# Patient Record
Sex: Female | Born: 1965
Health system: Southern US, Community
[De-identification: ages and names within clinical notes are randomized; demographics above are authoritative.]

## PROBLEM LIST (undated history)

## (undated) DIAGNOSIS — K219 Gastro-esophageal reflux disease without esophagitis: Secondary | ICD-10-CM

## (undated) DIAGNOSIS — T8859XA Other complications of anesthesia, initial encounter: Secondary | ICD-10-CM

## (undated) DIAGNOSIS — T4145XA Adverse effect of unspecified anesthetic, initial encounter: Secondary | ICD-10-CM

## (undated) DIAGNOSIS — F329 Major depressive disorder, single episode, unspecified: Secondary | ICD-10-CM

## (undated) DIAGNOSIS — M2550 Pain in unspecified joint: Secondary | ICD-10-CM

## (undated) DIAGNOSIS — M1990 Unspecified osteoarthritis, unspecified site: Secondary | ICD-10-CM

## (undated) DIAGNOSIS — R112 Nausea with vomiting, unspecified: Secondary | ICD-10-CM

## (undated) DIAGNOSIS — Z9889 Other specified postprocedural states: Secondary | ICD-10-CM

## (undated) DIAGNOSIS — F32A Depression, unspecified: Secondary | ICD-10-CM

## (undated) HISTORY — PX: NASAL SINUS SURGERY: SHX719

## (undated) HISTORY — PX: ABDOMINAL HYSTERECTOMY: SHX81

---

## 2006-11-19 ENCOUNTER — Encounter: Admission: RE | Admit: 2006-11-19 | Discharge: 2007-02-04 | Payer: Self-pay | Admitting: Unknown Physician Specialty

## 2007-01-20 ENCOUNTER — Ambulatory Visit (HOSPITAL_COMMUNITY): Admission: RE | Admit: 2007-01-20 | Discharge: 2007-01-20 | Payer: Self-pay | Admitting: Unknown Physician Specialty

## 2007-05-12 ENCOUNTER — Emergency Department (HOSPITAL_COMMUNITY): Admission: EM | Admit: 2007-05-12 | Discharge: 2007-05-12 | Payer: Self-pay | Admitting: Family Medicine

## 2008-01-25 ENCOUNTER — Ambulatory Visit (HOSPITAL_COMMUNITY): Admission: RE | Admit: 2008-01-25 | Discharge: 2008-01-25 | Payer: Self-pay | Admitting: Obstetrics and Gynecology

## 2008-01-26 ENCOUNTER — Ambulatory Visit (HOSPITAL_COMMUNITY): Admission: RE | Admit: 2008-01-26 | Discharge: 2008-01-26 | Payer: Self-pay | Admitting: Obstetrics and Gynecology

## 2008-01-28 ENCOUNTER — Emergency Department (HOSPITAL_COMMUNITY): Admission: EM | Admit: 2008-01-28 | Discharge: 2008-01-28 | Payer: Self-pay | Admitting: Emergency Medicine

## 2008-07-20 ENCOUNTER — Encounter (INDEPENDENT_AMBULATORY_CARE_PROVIDER_SITE_OTHER): Payer: Self-pay | Admitting: Otolaryngology

## 2008-07-20 ENCOUNTER — Ambulatory Visit (HOSPITAL_COMMUNITY): Admission: RE | Admit: 2008-07-20 | Discharge: 2008-07-20 | Payer: Self-pay | Admitting: Otolaryngology

## 2009-04-14 ENCOUNTER — Ambulatory Visit (HOSPITAL_COMMUNITY): Admission: RE | Admit: 2009-04-14 | Discharge: 2009-04-14 | Payer: Self-pay | Admitting: Family Medicine

## 2010-02-25 HISTORY — PX: CHOLECYSTECTOMY: SHX55

## 2010-04-05 ENCOUNTER — Other Ambulatory Visit (HOSPITAL_COMMUNITY): Payer: Self-pay | Admitting: Family Medicine

## 2010-04-05 DIAGNOSIS — Z1231 Encounter for screening mammogram for malignant neoplasm of breast: Secondary | ICD-10-CM

## 2010-04-16 ENCOUNTER — Ambulatory Visit (HOSPITAL_COMMUNITY): Payer: 59

## 2010-04-18 ENCOUNTER — Other Ambulatory Visit: Payer: Self-pay | Admitting: Surgery

## 2010-04-18 ENCOUNTER — Emergency Department (INDEPENDENT_AMBULATORY_CARE_PROVIDER_SITE_OTHER): Payer: 59

## 2010-04-18 ENCOUNTER — Emergency Department (HOSPITAL_BASED_OUTPATIENT_CLINIC_OR_DEPARTMENT_OTHER)
Admission: EM | Admit: 2010-04-18 | Discharge: 2010-04-18 | Disposition: A | Discharge status: Other Acute Inpatient Hospital | Payer: 59 | Source: Home / Self Care | Attending: Emergency Medicine | Admitting: Emergency Medicine

## 2010-04-18 ENCOUNTER — Observation Stay (HOSPITAL_COMMUNITY)
Admission: EM | Admit: 2010-04-18 | Discharge: 2010-04-19 | Disposition: A | Discharge status: Home / Self Care | Payer: 59 | Attending: Surgery | Admitting: Surgery

## 2010-04-18 DIAGNOSIS — R110 Nausea: Secondary | ICD-10-CM | POA: Insufficient documentation

## 2010-04-18 DIAGNOSIS — R109 Unspecified abdominal pain: Secondary | ICD-10-CM | POA: Insufficient documentation

## 2010-04-18 DIAGNOSIS — R10811 Right upper quadrant abdominal tenderness: Secondary | ICD-10-CM | POA: Insufficient documentation

## 2010-04-18 DIAGNOSIS — K819 Cholecystitis, unspecified: Secondary | ICD-10-CM | POA: Insufficient documentation

## 2010-04-18 DIAGNOSIS — K8010 Calculus of gallbladder with chronic cholecystitis without obstruction: Principal | ICD-10-CM | POA: Insufficient documentation

## 2010-04-18 DIAGNOSIS — K8020 Calculus of gallbladder without cholecystitis without obstruction: Secondary | ICD-10-CM

## 2010-04-18 DIAGNOSIS — M549 Dorsalgia, unspecified: Secondary | ICD-10-CM | POA: Insufficient documentation

## 2010-04-18 LAB — COMPREHENSIVE METABOLIC PANEL
ALT: 20 U/L (ref 0–35)
Alkaline Phosphatase: 56 U/L (ref 39–117)
Calcium: 9.4 mg/dL (ref 8.4–10.5)
Creatinine, Ser: 0.8 mg/dL (ref 0.4–1.2)
GFR calc Af Amer: >60 mL/min (ref >60)
Potassium: 4.6 mEq/L (ref 3.5–5.1)
Total Bilirubin: 0.7 mg/dL (ref 0.3–1.2)

## 2010-04-18 LAB — CBC
MCHC: 33.6 g/dL (ref 30.0–36.0)
MCV: 90.1 fL (ref 78.0–100.0)
Platelets: 248 10*3/uL (ref 150–400)
RDW: 12.3 % (ref 11.5–15.5)

## 2010-04-18 LAB — DIFFERENTIAL
Basophils Absolute: 0 10*3/uL (ref 0.0–0.1)
Eosinophils Absolute: 0.1 10*3/uL (ref 0.0–0.7)
Lymphocytes Relative: 30 % (ref 12–46)
Lymphs Abs: 1.7 10*3/uL (ref 0.7–4.0)
Monocytes Absolute: 0.5 10*3/uL (ref 0.1–1.0)
Monocytes Relative: 9 % (ref 3–12)
Neutro Abs: 3.4 10*3/uL (ref 1.7–7.7)

## 2010-04-18 LAB — URINALYSIS, ROUTINE W REFLEX MICROSCOPIC
Hgb urine dipstick: NEGATIVE
Ketones, ur: 15 mg/dL — AB
Protein, ur: NEGATIVE mg/dL
Specific Gravity, Urine: 1.022 (ref 1.005–1.030)

## 2010-04-19 LAB — CBC
Hemoglobin: 11.2 g/dL — ABNORMAL LOW (ref 12.0–15.0)
MCHC: 32.7 g/dL (ref 30.0–36.0)
MCV: 92.4 fL (ref 78.0–100.0)
RBC: 3.7 MIL/uL — ABNORMAL LOW (ref 3.87–5.11)
RDW: 12.5 % (ref 11.5–15.5)
WBC: 6.7 10*3/uL (ref 4.0–10.5)

## 2010-04-19 LAB — COMPREHENSIVE METABOLIC PANEL
AST: 19 U/L (ref 0–37)
Albumin: 3 g/dL — ABNORMAL LOW (ref 3.5–5.2)
BUN: 4 mg/dL — ABNORMAL LOW (ref 6–23)
CO2: 27 mEq/L (ref 19–32)
Creatinine, Ser: 0.77 mg/dL (ref 0.4–1.2)
Glucose, Bld: 80 mg/dL (ref 70–99)
Sodium: 140 mEq/L (ref 135–145)
Total Protein: 5.4 g/dL — ABNORMAL LOW (ref 6.0–8.3)

## 2010-04-21 NOTE — H&P (Signed)
NAME:  Jody Blackwell, Jody Blackwell               ACCOUNT NO.:  [id number]  MEDICAL RECORD NO.:  [id number]           PATIENT TYPE:  E  LOCATION:  [facility]                         FACILITY:  [facility]  PHYSICIAN:  Abigail Miyamoto, M.D. DATE OF BIRTH:  [birth year]  DATE OF ADMISSION:  04/18/2010 DATE OF DISCHARGE:                             HISTORY & PHYSICAL   PRIMARY CARE PHYSICIAN:  Lupita Raider, MD.  REQUESTING PHYSICIAN:  Worthy Rancher, PA  CHIEF COMPLAINT:  Abdominal pain.  BRIEF HISTORY:  The patient is a 45 year old white female who noted some pain on Monday afternoon.  It was intermittent stomach, it was mild, nothing really made it better or worse.  It might have been a little bit worse after eating.  She has a problem with her shoulder and slept poorly on Monday night, but attributed to her shoulder discomfort.  Her shoulder discomfort has been ongoing for 2 months and she has an appointment to see an orthopedic surgeon today.  Tuesday morning, she felt fine and ate manicotti for lunch and the pain returned.  It was a low-grade discomfort, which also improved.  At supper, she ate some chicken, broccoli and brown rice and had no discomfort until this morning.  When she got up, she had pain in the midabdomen, then it went to her ribs and her back.  It was in the right upper quadrant.  It was significantly worse than anything she has ever had.  She had some nausea.  She tried some Tums, which did not help.  She tried to go to work.  When pain became so severe, she had to pull over.  Her husband, picked her up and took her to ER facility at [facility].  The patient reports she has had pain in right upper quadrant before, but nothing like what she has had today.  She has had no further nausea or vomiting. Her pain is currently well-controlled.  Last pain medication was around 9:30 a.m.  It is currently 1:20 p.m.  PAST MEDICAL HISTORY: 1. Migraine.  She has one every 3-4 months and she  is on propranolol     for this. 2. She has shingles 4 years ago. 3. History of sinusitis.  She has endoscopic nasal surgery, April 02, 2008. 4. She has a remote history of colitis after her son was born.  She     has undergone colonoscopy, but has had no problems for many years.  SURGICAL HISTORY:  Endoscopic nasal surgery, April 02, 2008.  She has had two C-sections and hysterectomy.  She has a history of LASIK eyes surgery bilaterally last year, history of plantar fasciitis with orthotics in her shoes.  FAMILY HISTORY:  Mother is living at 59 with diabetes.  Father is 78 with diabetes, heart disease, and arthritis.  She has one brother with high blood pressure, who uses tobacco.  No sisters.  SOCIAL HISTORY:  She is a Human resources officer.  Drugs:  None.  Alcohol: Social.  Cigarettes:  She smoked about 40 years, none since age 79.  REVIEW OF SYSTEMS:  FEVER:  None.  SKIN:  No changes.  WEIGHT:  She is down 12 pounds in 5 weeks on Weight Watchers.  CV:  She has occasional dizziness, all sounds are orthostatic.  It usually occurs with her standing.  No history of syncope, presyncope, stroke, or seizure. PULMONARY:  No orthopnea.  No PND.  No dyspnea on exertion.  No coughing or wheezing.  No recent URI.  CARDIAC:  No history of chest pain or palpitations.  No prior cardiac workup.  GI is positive for GERD.  No nausea, vomiting, diarrhea, constipation, or blood.  GU:  No trouble voiding.  LOWER EXTREMITIES:  No edema.  No claudication.  She does note she has plantar fasciitis.  MUSCULOSKELETAL:  No arthritis. PSYCHIATRIC:  No problems noted.  CURRENT MEDICATIONS: 1. Propranolol SR 80 mg daily. 2. [name] 0.625 mg daily. 3. Multivitamin daily. 4. Fish oil one daily. 5. Calcium citrate and [name] one daily. 6. Tylenol p.r.n., she also takes [name] p.r.n. for her GERD.  ALLERGIES:  ASPIRIN causes facial swelling; IBUPROFEN causes facial swelling; REGLAN causes  Parkinson's like syndrome; SULFA causes hives and nausea, often it comes with GENERAL ANESTHESIA.  Her last meal was 7 p.m. yesterday, April 17, 2010.  PHYSICAL EXAM:  GENERAL:  This is a well-nourished, well-developed white female, in no acute distress. VITAL SIGNS:  Temperature is 98.3, blood pressure is 114/69, heart rate is 75, respiratory rate is 20, satting 100% on room air. HEENT:  Head:  Normocephalic.  Eyes: Pupils equal, react to light. Ears, nose, throat, and mouth within normal limits.  Normal mucosa. NECK:  Trachea is midline.  Thyroid is not palpated.  There is no palpable thyromegaly, bruits, or JVD. CARDIAC:  No murmurs or rubs. PULMONARY:  Normal respiratory effort.  Clear to auscultation. ABDOMEN:  Soft, positive bowel sounds.  No palpable hepatosplenomegaly. No masses, hernia, or abscesses.  She is slightly tender to palpation in the right upper quadrant. GU/RECTAL:  Deferred. LYMPHADENOPATHY:  None noted. MUSCULOSKELETAL: Within normal limits. SKIN:  No changes. NEUROLOGIC:  No focal deficits noted. PSYCHIATRIC:  Normal affect.  LABORATORY DATA:  White count is 5.8, hemoglobin is 13, hematocrit is 39, platelets 248,000.  UA is negative.  Sodium is 143, potassium is 4.6, chloride is 108, CO2 is 27, BUN is 14, creatinine is 0.8, glucose is 105, lipase 214, SGPT is 20, SGOT is 17, total bilirubin is 0.7. Abdominal ultrasound reveals gallbladder filled with gallstones, sludge gallbladder, wall is thickened, and does not say how thick.  Her bile ducts are patent, 3 mm.  The right lobe of the liver has an 8-mm hemangioma.  The pancreas is normal and she had pain over the gallbladder with compression on ultrasound.  IMPRESSION: 1. Symptomatic cholelithiasis with probable low-grade pancreatitis. 2. Remote history of colitis. 3. Migraines. 4. Shingles. 5. Sinus surgery.  PLAN:  We are going to hydrate the patient.  We will make her n.p.o.  We will gently plan  to take her gallbladder out either today or tomorrow depending on how the schedule goes for remainder of the day.  We are going to put her on some Cipro.  We will recheck her labs in the a.m. with further workup and evaluation as needed.     Eber Hong, P.A.   ______________________________ Abigail Miyamoto, M.D.    WDJ/MEDQ  D:  04/18/2010  T:  04/18/2010  Job:  [id number]  cc:   Lupita Raider, M.D.  Electronically Signed by Sherrie George P.A. on 04/20/2010 10:53:23  AM Electronically Signed by Abigail Miyamoto M.D. on 04/21/2010 04:08:50 PM

## 2010-04-21 NOTE — Op Note (Signed)
  [name], [name]               ACCOUNT NO.:  [id number]  MEDICAL RECORD NO.:  [id number]           PATIENT TYPE:  I  LOCATION:  5128                         FACILITY:  [facility]  PHYSICIAN:  Abigail Miyamoto, M.D. DATE OF BIRTH:  1965/04/22  DATE OF PROCEDURE:  04/18/2010 DATE OF DISCHARGE:  04/18/2010                              OPERATIVE REPORT   PREOPERATIVE DIAGNOSES:  Acute cholecystitis and cholelithiasis.  POSTOPERATIVE DIAGNOSES:  Acute cholecystitis and cholelithiasis.  PROCEDURE:  Laparoscopic cholecystectomy.  SURGEON:  Abigail Miyamoto, MD.  ASSISTANT:  Brayton El, PA-C  ANESTHESIA:  General endotracheal anesthesia and 0.5% Marcaine.  ESTIMATED BLOOD LOSS:  Minimal.  FINDINGS:  The patient was found to have an acutely inflamed gallbladder with gallstones.  PROCEDURE IN DETAIL:  The patient was brought to the operating room and identified as Jody Blackwell.  She was placed supine on the operating room table and general anesthesia was induced.  Her abdomen was then prepped and draped in usual sterile fashion.  Using a #15 blade, a small vertical incision was made below the umbilicus.  This was carried down to the fascia which was then opened with the scalpel.  A hemostat was then used to pass through the peritoneal cavity under direct vision. Next, a 0-Vicryl pursestring suture was placed around the fascial opening.  The [facility] port was placed through the opening and insufflation of the abdomen was begun.  A 5-mm port was then placed to the patient's epigastrium and 2 more placed in the right upper quadrant, all under direct vision.  The gallbladder was then grasped and retracted above the liver bed.  Several adhesions to the gallbladder were then taken down bluntly.  The cystic duct was then easily dissected out and a critical window was achieved around it.  It was clipped three times proximally, once distally, and transected.  The cystic artery was  then identified and clipped three times proximally, once distally and transected as well.  The gallbladder was then slowly dissected free from the liver bed with electrocautery.  Once it freed from liver bed, hemostasis was achieved with cautery.  I then removed the gallbladder through the incision at the umbilicus.  The 0 Vicryl at the umbilicus was tied in place closing the fascial defect.  I then again evaluated the liver bed and hemostasis felt to be achieved.  All ports were removed under direct vision and the abdomen was deflated.  All incisions were anesthetized with Marcaine and closed with 4-0 Monocryl subcuticular sutures.  Steri-Strips and Band- Aids were then applied.  The patient tolerated the procedure well.  All counts were correct at the end of procedure.  The patient was then extubated in the operating room and taken in a stable condition to the recovery room.     Abigail Miyamoto, M.D.     DB/MEDQ  D:  04/18/2010  T:  04/19/2010  Job:  [id number]  Electronically Signed by Abigail Miyamoto M.D. on 04/21/2010 04:08:52 PM

## 2010-04-24 ENCOUNTER — Ambulatory Visit (HOSPITAL_COMMUNITY): Payer: 59

## 2010-05-28 ENCOUNTER — Ambulatory Visit: Payer: 59 | Attending: Family Medicine | Admitting: Physical Therapy

## 2010-05-28 DIAGNOSIS — M25519 Pain in unspecified shoulder: Secondary | ICD-10-CM | POA: Insufficient documentation

## 2010-05-28 DIAGNOSIS — IMO0001 Reserved for inherently not codable concepts without codable children: Secondary | ICD-10-CM | POA: Insufficient documentation

## 2010-05-28 DIAGNOSIS — M25619 Stiffness of unspecified shoulder, not elsewhere classified: Secondary | ICD-10-CM | POA: Insufficient documentation

## 2010-06-05 LAB — CBC
HCT: 36.6 % (ref 36.0–46.0)
Hemoglobin: 12.7 g/dL (ref 12.0–15.0)
MCHC: 34.7 g/dL (ref 30.0–36.0)
Platelets: 226 10*3/uL (ref 150–400)
RDW: 12.2 % (ref 11.5–15.5)

## 2010-06-05 LAB — ANAEROBIC CULTURE

## 2010-06-05 LAB — CULTURE, ROUTINE-SINUS

## 2010-06-07 ENCOUNTER — Ambulatory Visit: Payer: 59 | Admitting: Physical Therapy

## 2010-06-08 ENCOUNTER — Ambulatory Visit (HOSPITAL_COMMUNITY)
Admission: RE | Admit: 2010-06-08 | Discharge: 2010-06-08 | Disposition: A | Discharge status: Home / Self Care | Payer: 59 | Source: Ambulatory Visit | Attending: Family Medicine | Admitting: Family Medicine

## 2010-06-08 DIAGNOSIS — Z1231 Encounter for screening mammogram for malignant neoplasm of breast: Secondary | ICD-10-CM | POA: Insufficient documentation

## 2010-06-11 ENCOUNTER — Other Ambulatory Visit: Payer: Self-pay | Admitting: Family Medicine

## 2010-06-11 DIAGNOSIS — R928 Other abnormal and inconclusive findings on diagnostic imaging of breast: Secondary | ICD-10-CM

## 2010-06-12 ENCOUNTER — Ambulatory Visit: Payer: 59 | Admitting: Physical Therapy

## 2010-06-13 ENCOUNTER — Ambulatory Visit
Admission: RE | Admit: 2010-06-13 | Discharge: 2010-06-13 | Disposition: A | Discharge status: Home / Self Care | Payer: 59 | Source: Ambulatory Visit | Attending: Family Medicine | Admitting: Family Medicine

## 2010-06-13 DIAGNOSIS — R928 Other abnormal and inconclusive findings on diagnostic imaging of breast: Secondary | ICD-10-CM

## 2010-06-14 ENCOUNTER — Encounter: Payer: 59 | Admitting: Physical Therapy

## 2010-06-19 ENCOUNTER — Ambulatory Visit: Payer: 59 | Admitting: Physical Therapy

## 2010-06-21 ENCOUNTER — Ambulatory Visit: Payer: 59 | Admitting: Physical Therapy

## 2010-06-26 ENCOUNTER — Ambulatory Visit: Payer: 59 | Attending: Family Medicine | Admitting: Physical Therapy

## 2010-06-26 DIAGNOSIS — IMO0001 Reserved for inherently not codable concepts without codable children: Secondary | ICD-10-CM | POA: Insufficient documentation

## 2010-06-26 DIAGNOSIS — M25519 Pain in unspecified shoulder: Secondary | ICD-10-CM | POA: Insufficient documentation

## 2010-06-26 DIAGNOSIS — M25619 Stiffness of unspecified shoulder, not elsewhere classified: Secondary | ICD-10-CM | POA: Insufficient documentation

## 2010-06-28 ENCOUNTER — Ambulatory Visit: Payer: 59 | Admitting: Physical Therapy

## 2010-06-29 ENCOUNTER — Ambulatory Visit (INDEPENDENT_AMBULATORY_CARE_PROVIDER_SITE_OTHER): Payer: 59 | Admitting: Family Medicine

## 2010-06-29 ENCOUNTER — Encounter: Payer: Self-pay | Admitting: Family Medicine

## 2010-06-29 VITALS — BP 104/73 | HR 65 | Ht 65.5 in | Wt 174.0 lb

## 2010-06-29 DIAGNOSIS — M25519 Pain in unspecified shoulder: Secondary | ICD-10-CM

## 2010-06-29 DIAGNOSIS — M25512 Pain in left shoulder: Secondary | ICD-10-CM

## 2010-06-29 NOTE — Patient Instructions (Signed)
You have rotator cuff impingement and subacromial bursitis. Try to avoid painful activities (overhead activities, lifting with extended arm) as much as possible. Tylenol as needed for pain Ice shoulder 15 minutes at a time 3-4 times a day. You were given a subacromial injection to help with pain and to decrease inflammation. Restart physical therapy after 5-7 days until you're done with your course of treatment. After that, do home exercises that they show you. For wrist, if numbness persists after a week, call us - at that time would recommend you try a wrist brace to eliminate excessive wrist motion causing traction on ulnar nerve across wrist. Follow up with me in 1 month for a recheck.

## 2010-06-29 NOTE — Progress Notes (Signed)
Subjective:    Patient ID: Jody Blackwell, female    DOB: 1965-08-26, 45 y.o.   MRN: [id number]  HPI  45 yo F here for left shoulder pain.  Patient reports no known injury Pain started about 5 months ago on outside of left shoulder. She is right handed. Over time pain has worsened to include upper arm, left side of upper back. Pain worse with overhead activities and reaching - feels mainly outside left upper arm/shoulder. No numbness and tingling until this morning - woke up with only numbness lateral 4th and 5th fingers of left hand (and ulnar side of left hand). No prior issues with this shoulder. No prior shoulder or neck surgeries. + night pain Has been in PT for 4 weeks for her left shoulder - not made much improvement with this. X-rays done at outside facility were negative per report. Takes tylenol arthritis tid.  Past Medical History  Diagnosis Date  . Migraine   . Chronic sinusitis     No current outpatient prescriptions on file prior to visit.    Past Surgical History  Procedure Date  . Cholecystectomy   . Abdominal hysterectomy   . Nasal sinus surgery 2007, 2010    Allergies  Allergen Reactions  . Aspirin   . Ibuprofen   . Reglan   . Sulfa Antibiotics   . Zithromax (Azithromycin Dihydrate)     History   Social History  . Marital Status: Married    Spouse Name: Jody Blackwell    Number of Children: Jody Blackwell  . Years of Education: Jody Blackwell   Occupational History  . Not on file.   Social History Main Topics  . Smoking status: Former Games developer  . Smokeless tobacco: Never Used  . Alcohol Use: Not on file  . Drug Use: Not on file  . Sexually Active: Not on file   Other Topics Concern  . Not on file   Social History Narrative  . No narrative on file    Family History  Problem Relation Age of Onset  . Diabetes Mother   . Diabetes Father   . Heart attack Father   . Hypertension Father   . Diabetes Maternal Grandfather   . Heart attack Maternal Grandfather   .  Hypertension Maternal Grandfather   . Diabetes Paternal Grandfather   . Heart attack Paternal Grandfather   . Hypertension Paternal Grandfather     BP 104/73  Pulse 65  Ht 5' 5.5" (1.664 m)  Wt 174 lb (78.926 kg)  BMI 28.51 kg/m2  Review of Systems See HPI above.    Objective:   Physical Exam Gen: NAD  Neck: No gross deformity, swelling, or bruising. Mild TTP left paraspinal cervical region, trapezius, diffusely about shoulder. FROM without reproduction of pain she describes. Strength 5-/5 left triceps and 4/5 empty can otherwise 5/5 BUEs Negative spurlings bilaterally Sensation intact to light touch but only diminished ulnar aspect left hand and 4th and 5th fingers left hand.  L shoulder: No gross deformity, swelling, or bruising. Diffusely TTP including biceps tendon, AC joint. Full passive ROM.  AROM to 100 degrees abduction, 100 flexion, 80 ER.   Strength 4/5 empty can, 5/5 resisted IR/ER.  Shoulder pain is reproduced with empty can, also has pain with resisted ER. + Hawkins, + Neers Negative speeds and yergasons Negative apprehension.  R shoulder: FROM without pain or weakness     MSK U/S L shoulder: Biceps tendon intact on long and transverse views without target sign.  AC joint  with minimal DJD.  Subscapularis intact without impingement.  Supraspinatus intact on long and transverse views but thickened bursa.  Mild impingement on testing.    Assessment & Plan:  1. L shoulder pain - 2/2 rotator cuff impingement with subacromial bursitis.  Proceeded with subacromial injection given PT has not helped.  To continue with PT after 5-7 days then focus on home exercise program.  Tylenol and icing as needed.  If not improving, can consider MRI for further assessment but ultrasound does not indicate a supraspinatus tear nor retraction.  Should improve with conservative treatment.

## 2010-06-29 NOTE — Assessment & Plan Note (Signed)
2/2 rotator cuff impingement with subacromial bursitis.  Proceeded with subacromial injection given PT has not helped.  To continue with PT after 5-7 days then focus on home exercise program.  Tylenol and icing as needed.  If not improving, can consider MRI for further assessment but ultrasound does not indicate a supraspinatus tear nor retraction.  Should improve with conservative treatment.

## 2010-07-02 ENCOUNTER — Ambulatory Visit: Payer: 59 | Admitting: Physical Therapy

## 2010-07-05 ENCOUNTER — Ambulatory Visit: Payer: 59 | Admitting: Rehabilitation

## 2010-07-06 ENCOUNTER — Ambulatory Visit: Payer: 59 | Admitting: Family Medicine

## 2010-07-09 ENCOUNTER — Ambulatory Visit: Payer: 59 | Admitting: Rehabilitation

## 2010-07-10 NOTE — Op Note (Signed)
NAME:  Jody Blackwell, Jody Blackwell               ACCOUNT NO.:  [id number]   MEDICAL RECORD NO.:  [id number]          PATIENT TYPE:  AMB   LOCATION:  SDS                          FACILITY:  [facility]   PHYSICIAN:  Kinnie Scales. Annalee Genta, M.D.DATE OF BIRTH:  [birth year]   DATE OF PROCEDURE:  07/20/2008  DATE OF DISCHARGE:  07/20/2008                               OPERATIVE REPORT   PREOPERATIVE DIAGNOSES:  1. Chronic sinusitis.  2. Status post prior endoscopic sinus surgery (2007).  3. Nasal polyposis.   POSTOPERATIVE DIAGNOSIS:  1. Chronic sinusitis.  2. Status post prior endoscopic sinus surgery (2007).  3. Nasal polyposis.   INDICATIONS FOR SURGERY:  1. Chronic sinusitis.  2. Status post prior endoscopic sinus surgery (2007).  3. Nasal polyposis.   SURGICAL PROCEDURES:  Revision, bilateral endoscopic sinus surgery with  intraoperative computer-assisted navigation (stealth consisting of right  total ethmoidectomy and right maxillary antrostomy with removal of  diseased tissue and left anterior ethmoidectomy).   SURGEON:  Kinnie Scales. Annalee Genta, MD   COMPLICATIONS:  None.   ESTIMATED BLOOD LOSS:  Less than 100 mL.   COMPLICATIONS:  None.  The patient was transferred from the operating  room to recovery room in stable condition.   BRIEF HISTORY:  The patient is a 45 year old white female who presented  for evaluation of chronic symptoms of sinusitis.  The patient undergone  prior endoscopic sinus surgery in 2000, with an initial improvement in  symptoms including decrease nasal congestion, sinus pressure, and  headache.  Over the last 18 months, the patient has had ongoing symptoms  of heavy nasal discharge, cough, low-grade fever and right periorbital  and sinus pain.  Multiple treatments of antibiotics were undertaken as  well as treatment with topical and oral steroids and despite this  aggressive medical therapy, the patient continued to have ongoing  symptoms.  Examination in the office  revealed purulent drainage and  polypoid changes in the right common ethmoid cavity and maxillary  antrostomy.  CT scanning was obtained with stealth format for  intraoperative navigation.  The risks, benefits, and possible  complications of the above surgical procedure was discussed in detail  and the patient understood and concurred with our plan for surgery,  which is scheduled as an outpatient under general anesthesia at [facility].   PROCEDURE:  The patient was brought to the operating room on Jul 20, 2008, and placed in supine position on the operating table.  General  endotracheal anesthesia was established without difficulty.  When the  patient was adequately anesthetized, her nasal cavity was inspected and  she was injected with a total of 3 mL of 1% lidocaine and 1:100,000  solution of epinephrine injected on the right-hand side in submucosal  fashion along the lateral nasal wall middle turbinate and within the  nasal polyp.  She also injected via transoral sphenopalatine injection  on the right, total of 3 mL of 1% lidocaine with 1:100,000 epinephrine,  epinephrine was injected.  The patient's nose was then packed with Afrin  soaked cottonoid pledgets, which were let in place for  approximately 10  minutes.  The patient was positioned on the operating table, and prepped  and draped in a sterile fashion.  The stealth navigation tool headgear  was applied, and anatomic and surgical landmarks were identified and  confirmed.  Stealth device was used throughout the endoscopic portion of  the surgical procedure.   Attention was then turned to the patient's right-hand side where  endoscopic sinus surgery was undertaken.  Packing was removed and a 0-  degree telescope was used to inspect the patient from anterior to  posterior.  She had a large soft-tissue polypoid mass involving the  entire middle meatus and right maxillary ostium with purulent material  draining  posteriorly.  Culture and sensitivity for aerobic and anaerobic  cultures were obtained and sent to microbiology.  The polypoid material  was then resected using a straight microdebrider and a 0-degree  telescope.  Polypoid disease removed from the entire ethmoid cavity from  anterior to posterior.  The roof of the ethmoid was identified,  posterior roof of the ethmoid was identified.  With a 45-degree  telescope, dissection was then carried from posterior to anterior  removing polypoid disease and bony septations throughout this area.  There was purulent material and chronic-appearing infection.  The  navigation tool was used as well as a curved microdebrider in order to  dissect along the roof of the ethmoid sinus and the medial orbital wall.  Attention was then turned to the maxillary ostium where a large polypoid  mass was completely occluding the natural ostium of the maxillary sinus.  Using a 45-degree telescope and curved microdebrider, this area was  dissected and polypoid disease was removed from within the maxillary  sinus creating a widely patent ostium and dissection to the level of the  normal mucosa.  Sinus was thoroughly irrigated of mucopurulent material  and debris.   Attention was then turned to the patient's left-hand side where a 0-  degree endoscope was used for inspection.  There was an area of  significant scarring between the lateral wall of the middle turbinate  and the maxillary sinus ostium using a straight through cutting forceps.  Scar tissue was removed creating a widely patent left anterior ethmoid  region and left maxillary ostium.  No evidence of active disease or  polyps within this portion of the sinuses and no further implementation  was undertaken.   The sinuses were then thoroughly inspected.  There was no active  bleeding.  A Kennedy sinus pack was placed in the right nasal cavity  after application of Bactroban ointment and gently hydrated with  saline.  The patient's nasal cavity and nasopharynx were irrigated and suctioned.  Orogastric tube was passed.  Stomach contents were aspirated.  The  patient was then awakened from anesthetic.  She was extubated and  transferred from the operating room to the recovery in stable condition.  No complications.  Blood loss less than 100 mL.           ______________________________  Kinnie Scales. Annalee Genta, M.D.     DLS/MEDQ  D:  16/11/9602  T:  07/21/2008  Job:  [id number]

## 2010-07-11 ENCOUNTER — Ambulatory Visit: Payer: 59 | Admitting: Rehabilitation

## 2010-07-30 ENCOUNTER — Ambulatory Visit (INDEPENDENT_AMBULATORY_CARE_PROVIDER_SITE_OTHER): Payer: 59 | Admitting: Family Medicine

## 2010-07-30 ENCOUNTER — Encounter: Payer: Self-pay | Admitting: Family Medicine

## 2010-07-30 VITALS — BP 113/76 | HR 69 | Temp 97.5°F | Ht 66.0 in | Wt 172.0 lb

## 2010-07-30 DIAGNOSIS — M25519 Pain in unspecified shoulder: Secondary | ICD-10-CM

## 2010-07-30 DIAGNOSIS — M25512 Pain in left shoulder: Secondary | ICD-10-CM

## 2010-07-30 NOTE — Assessment & Plan Note (Addendum)
Most likely 2/2 rotator cuff impingement with subacromial bursitis but would expect her to be improved with PT and injection.  She has had mild improvement on exam but still waking up at nighttime, unable to reach overhead and behind without pain.  Will proceed with MRI to further assess for rotator cuff tear.  Continue HEP, tylenol, icing.  Will call her with the results.

## 2010-07-30 NOTE — Progress Notes (Signed)
Subjective:    Patient ID: Jody Blackwell, female    DOB: Nov 11, 1965, 45 y.o.   MRN: [id number]  HPI   45 yo F here for 1 month f/u left shoulder pain.  Patient reported no known injury Pain started about 6 months ago on outside of left shoulder. She is right handed. Over time pain worsened to include upper arm, left side of upper back. Pain worse with overhead activities and reaching - feels mainly outside left upper arm/shoulder. No numbness and tingling any longer (had one morning). No prior issues with this shoulder. No prior shoulder or neck surgeries. + night pain Finished PT x 6 weeks and doing HEP. X-rays done at outside facility were negative per report. Takes tylenol arthritis tid. Subacromial injection - helped for 1 week but pain recurred.  Past Medical History  Diagnosis Date  . Migraine   . Chronic sinusitis     Current Outpatient Prescriptions on File Prior to Visit  Medication Sig Dispense Refill  . calcium carbonate 200 MG capsule Take 250 mg by mouth daily.        [name] Kitchen estrogens, conjugated, ([name]) 0.625 MG tablet Take 0.625 mg by mouth daily.        . Multiple Vitamin (MULTIVITAMIN) tablet Take 1 tablet by mouth daily.        . Omega-3 Fatty Acids (FISH OIL PO) Take 1 tablet by mouth daily.        . propranolol (INDERAL LA) 80 MG 24 hr capsule Take 80 mg by mouth daily.          Past Surgical History  Procedure Date  . Cholecystectomy   . Abdominal hysterectomy   . Nasal sinus surgery 2007, 2010    Allergies  Allergen Reactions  . Aspirin   . Ibuprofen   . Reglan   . Sulfa Antibiotics   . Zithromax (Azithromycin Dihydrate)     History   Social History  . Marital Status: Married    Spouse Name: N/A    Number of Children: N/A  . Years of Education: N/A   Occupational History  . Not on file.   Social History Main Topics  . Smoking status: Former Games developer  . Smokeless tobacco: Never Used  . Alcohol Use: Not on file  . Drug Use: Not on  file  . Sexually Active: Not on file   Other Topics Concern  . Not on file   Social History Narrative  . No narrative on file    Family History  Problem Relation Age of Onset  . Diabetes Mother   . Diabetes Father   . Heart attack Father   . Hypertension Father   . Diabetes Maternal Grandfather   . Heart attack Maternal Grandfather   . Hypertension Maternal Grandfather   . Diabetes Paternal Grandfather   . Heart attack Paternal Grandfather   . Hypertension Paternal Grandfather     BP 113/76  Pulse 69  Temp(Src) 97.5 F (36.4 C) (Oral)  Ht 5\' 6"  (1.676 m)  Wt 172 lb (78.019 kg)  BMI 27.76 kg/m2  Review of Systems  See HPI above.    Objective:   Physical Exam  Gen: NAD  L shoulder: No gross deformity, swelling, or bruising. Mild TTP biceps tendon. Full passive ROM.  Full AROM flexion, painful but can get to 120 degrees abduction.  + painful arc. Strength 5/5 empty can, 5/5 resisted IR/ER.  No pain with empty can now.  Mild pain with resisted ER. Negative  hawkins. Negative speeds and yergasons Negative apprehension.  R shoulder: FROM without pain or weakness     Assessment & Plan:  1. L shoulder pain - 2/2 rotator cuff impingement with subacromial bursitis.  She has had mild improvement on exam but still waking up at nighttime, unable to reach overhead and behind without pain.  Will proceed with MRI to further assess for rotator cuff tear.  Continue HEP, tylenol, icing.  Will call her with the results.

## 2010-08-01 ENCOUNTER — Ambulatory Visit (HOSPITAL_BASED_OUTPATIENT_CLINIC_OR_DEPARTMENT_OTHER)
Admission: RE | Admit: 2010-08-01 | Discharge: 2010-08-01 | Disposition: A | Discharge status: Home / Self Care | Payer: 59 | Source: Ambulatory Visit | Attending: Family Medicine | Admitting: Family Medicine

## 2010-08-01 DIAGNOSIS — M19019 Primary osteoarthritis, unspecified shoulder: Secondary | ICD-10-CM | POA: Insufficient documentation

## 2010-08-01 DIAGNOSIS — M25519 Pain in unspecified shoulder: Secondary | ICD-10-CM | POA: Insufficient documentation

## 2010-08-01 DIAGNOSIS — M25512 Pain in left shoulder: Secondary | ICD-10-CM

## 2010-08-03 ENCOUNTER — Encounter: Payer: Self-pay | Admitting: Family Medicine

## 2010-08-03 ENCOUNTER — Ambulatory Visit (INDEPENDENT_AMBULATORY_CARE_PROVIDER_SITE_OTHER): Payer: 59 | Admitting: Family Medicine

## 2010-08-03 ENCOUNTER — Ambulatory Visit (HOSPITAL_BASED_OUTPATIENT_CLINIC_OR_DEPARTMENT_OTHER)
Admission: RE | Admit: 2010-08-03 | Discharge: 2010-08-03 | Disposition: A | Discharge status: Home / Self Care | Payer: 59 | Source: Ambulatory Visit | Attending: Family Medicine | Admitting: Family Medicine

## 2010-08-03 ENCOUNTER — Ambulatory Visit (INDEPENDENT_AMBULATORY_CARE_PROVIDER_SITE_OTHER)
Admission: RE | Admit: 2010-08-03 | Discharge: 2010-08-03 | Disposition: A | Discharge status: Home / Self Care | Payer: 59 | Source: Ambulatory Visit | Attending: Family Medicine | Admitting: Family Medicine

## 2010-08-03 DIAGNOSIS — M25572 Pain in left ankle and joints of left foot: Secondary | ICD-10-CM

## 2010-08-03 DIAGNOSIS — M79606 Pain in leg, unspecified: Secondary | ICD-10-CM

## 2010-08-03 DIAGNOSIS — X58XXXA Exposure to other specified factors, initial encounter: Secondary | ICD-10-CM

## 2010-08-03 DIAGNOSIS — M25579 Pain in unspecified ankle and joints of unspecified foot: Secondary | ICD-10-CM

## 2010-08-03 DIAGNOSIS — M25519 Pain in unspecified shoulder: Secondary | ICD-10-CM

## 2010-08-03 DIAGNOSIS — M25512 Pain in left shoulder: Secondary | ICD-10-CM

## 2010-08-03 DIAGNOSIS — M79609 Pain in unspecified limb: Secondary | ICD-10-CM

## 2010-08-03 DIAGNOSIS — M7730 Calcaneal spur, unspecified foot: Secondary | ICD-10-CM | POA: Insufficient documentation

## 2010-08-03 NOTE — Assessment & Plan Note (Signed)
2/2 rotator cuff impingement, rotator cuff tendinopathy.  Will try another cortisone injection today along with rest for next week then resume exercises.  Given sling to use as needed but instructed to come out of this several times a day to do simple ROM exercises (codman discussed) of shoulder and elbow.  If not improving with this, will consider referral for shoulder arthroscopy, debridement, DCE, acromioplasty.    After informed written consent patient was seated in a chair.  Left shoulder prepped with alcohol swab and utilizing a posterior approach subacromial space was injected with 3:1 marcaine: depomedrol.  Patient tolerated procedure well without any immediate complications.

## 2010-08-03 NOTE — Assessment & Plan Note (Signed)
L ankle sprain - x-rays negative for fibular head fracture or ankle fracture.  Tenderness at ATFL with laxity on ant drawer and talar tilt.  ASO for support.  Offered crutches but she declined.  Shown ROM exercises - will advance to strengthening and balance at f/u.  Icing, tylenol, elevation.

## 2010-08-03 NOTE — Patient Instructions (Signed)
You have an ankle sprain. Ice the area for 15 minutes at a time, 3-4 times a day Take tylenol extra strength 2 tabs three times a day as needed for pain. Elevate above the level of your heart when possible Crutches if needed to help with walking Bear weight when tolerated Depending on severity your doctor will place you in an ACE wrap with a walking boot OR a laceup ankle brace to help with stability while you recover from this injury. Come out of the boot/brace twice a day to do Up/down and alphabet exercises 2-3 sets of each. Consider physical therapy for strengthening and balance exercises. If not improving as expected, we may repeat x-rays or consider further testing like an MRI. Follow up with me in 2 weeks for both issues.

## 2010-08-03 NOTE — Progress Notes (Signed)
Subjective:    Patient ID: Jody Blackwell, female    DOB: [birth year], 45 y.o.   MRN: [id number]  HPI   45 yo F here for f/u left shoulder pain but also with new left ankle injury.  Last OV: Patient reported no known injury Pain started about 6 months ago on outside of left shoulder. She is right handed. Over time pain worsened to include upper arm, left side of upper back. Pain worse with overhead activities and reaching - feels mainly outside left upper arm/shoulder. No numbness and tingling any longer (had one morning). No prior issues with this shoulder. No prior shoulder or neck surgeries. + night pain Finished PT x 6 weeks and doing HEP. X-rays done at outside facility were negative per report. Takes tylenol arthritis tid. Subacromial injection - helped for 1 week but pain recurred. Patient had MRI on Wednesday that showed mild infraspinatus tendinopathy, hooked acromion.  No rotator cuff tears.  We discussed trying another cortisone injection which she returns for today.  L ankle pain: Patient states yesterday evening she was stepping over a dog gate when her pants got caught and she came down on left ankle inverting it. Unable to walk immediately following this but now walking with a limp. Pain 7/10 Minimal swelling. Pain mainly lateral ankle. No prior ankle injuries or surgeries. Iced last night and rested.  Took some tylenol.  Past Medical History  Diagnosis Date  . Migraine   . Chronic sinusitis     Current Outpatient Prescriptions on File Prior to Visit  Medication Sig Dispense Refill  . calcium carbonate 200 MG capsule Take 250 mg by mouth daily.        [name] Kitchen estrogens, conjugated, ([name]) 0.625 MG tablet Take 0.625 mg by mouth daily.        . Multiple Vitamin (MULTIVITAMIN) tablet Take 1 tablet by mouth daily.        . Omega-3 Fatty Acids (FISH OIL PO) Take 1 tablet by mouth daily.        . propranolol (INDERAL LA) 80 MG 24 hr capsule Take 80 mg by mouth  daily.          Past Surgical History  Procedure Date  . Cholecystectomy   . Abdominal hysterectomy   . Nasal sinus surgery 2007, 2010    Allergies  Allergen Reactions  . Aspirin   . Ibuprofen   . Reglan   . Sulfa Antibiotics   . Zithromax (Azithromycin Dihydrate)     History   Social History  . Marital Status: Married    Spouse Name: N/A    Number of Children: N/A  . Years of Education: N/A   Occupational History  . Not on file.   Social History Main Topics  . Smoking status: Former Games developer  . Smokeless tobacco: Never Used  . Alcohol Use: Not on file  . Drug Use: Not on file  . Sexually Active: Not on file   Other Topics Concern  . Not on file   Social History Narrative  . No narrative on file    Family History  Problem Relation Age of Onset  . Diabetes Mother   . Diabetes Father   . Heart attack Father   . Hypertension Father   . Diabetes Maternal Grandfather   . Heart attack Maternal Grandfather   . Hypertension Maternal Grandfather   . Diabetes Paternal Grandfather   . Heart attack Paternal Grandfather   . Hypertension Paternal Grandfather  BP 110/70  Temp(Src) 97.7 F (36.5 C) (Oral)  Ht 5\' 6"  (1.676 m)  Wt 171 lb (77.565 kg)  BMI 27.60 kg/m2  Review of Systems  See HPI above.    Objective:   Physical Exam  Gen: NAD  L shoulder: Exam unchanged from last visit early this week.  L ankle: No gross deformity, swelling, bruising. Mild TTP fibular head, pain with syndesmotic compression and pain at ATFL.  No TTP base 5th, navicular, posterior aspects of medial and lateral malleoli, or other foot TTP. ROM mod limited in all planes but able to move in each direction. 1+ ant drawer, 1+ talar tilt (painful). Did not test strength 2/2 pain. NVI distally.     Assessment & Plan:  1. L shoulder pain - 2/2 rotator cuff impingement, rotator cuff tendinopathy.  Will try another cortisone injection today along with rest for next week then  resume exercises.  Given sling to use as needed but instructed to come out of this several times a day to do simple ROM exercises (codman discussed) of shoulder and elbow.  If not improving with this, will consider referral for shoulder arthroscopy, debridement, DCE, acromioplasty.    2. L ankle sprain - x-rays negative for fibular head fracture or ankle fracture.  Tenderness at ATFL with laxity on ant drawer and talar tilt.  ASO for support.  Offered crutches but she declined.  Shown ROM exercises - will advance to strengthening and balance at f/u.  Icing, tylenol, elevation.  After informed written consent patient was seated in a chair.  Left shoulder prepped with alcohol swab and utilizing a posterior approach subacromial space was injected with 3:1 marcaine: depomedrol.  Patient tolerated procedure well without any immediate complications.

## 2010-08-17 ENCOUNTER — Ambulatory Visit (INDEPENDENT_AMBULATORY_CARE_PROVIDER_SITE_OTHER): Payer: 59 | Admitting: Family Medicine

## 2010-08-17 ENCOUNTER — Encounter: Payer: Self-pay | Admitting: Family Medicine

## 2010-08-17 DIAGNOSIS — M25519 Pain in unspecified shoulder: Secondary | ICD-10-CM

## 2010-08-17 DIAGNOSIS — M25512 Pain in left shoulder: Secondary | ICD-10-CM

## 2010-08-17 DIAGNOSIS — M25572 Pain in left ankle and joints of left foot: Secondary | ICD-10-CM

## 2010-08-17 DIAGNOSIS — M25579 Pain in unspecified ankle and joints of unspecified foot: Secondary | ICD-10-CM

## 2010-08-17 NOTE — Assessment & Plan Note (Signed)
2/2 rotator cuff impingement, rotator cuff tendinopathy.  Patient has completed 6 weeks of PT and is compliant with HEP.  Taking tylenol and icing, have done 2 subacromial cortisone injections and only mildly improving.  We discussed natural history of rotator cuff tendinopathy, impingement with continued rotator cuff strengthening.  Discussed what surgical intervention entails (decompression, acromioplasty, DCE) and offered her referral to discuss specifics which she would like to do.

## 2010-08-17 NOTE — Assessment & Plan Note (Signed)
L ankle sprain - significantly improved.  Given theraband home exercises, balance exercises.  Icing, tylenol as needed.  ASO for next 4 weeks when on uneven ground or doing a lot of walking.  F/u prn for this issue.

## 2010-08-17 NOTE — Progress Notes (Signed)
Subjective:    Patient ID: Jody Blackwell, female    DOB: Jul 06, 1965, 45 y.o.   MRN: [id number]  HPI   45 yo F here for 2 week f/u left shoulder pain and left ankle sprain  L shoulder pain: Patient reported no known injury Pain started about 6 months prior to initial visit on 5/4 on outside of left shoulder. She is right handed. Over time pain worsened to include upper arm, left side of upper back. Pain worse with overhead activities and reaching - feels mainly outside left upper arm/shoulder. No numbness and tingling any longer (had one morning). No prior issues with this shoulder. No prior shoulder or neck surgeries. + night pain Finished PT x 6 weeks and currently doing HEP. X-rays done at outside facility were negative. Takes tylenol arthritis tid (allergic to nsaids and aspirin). Initial subacromial injection helped for 1 week but pain recurred. Patient had MRI that showed mild infraspinatus tendinopathy, hooked acromion.  No rotator cuff tears.   On last visit 5/8 we repeated subacromial injection given these findings and she has improved only mildly.  L ankle pain: Patient stated on 5/7 she was stepping over a dog gate when her pants got caught and she came down on left ankle inverting it. Unable to walk immediately following this. Pain was 7/10, now around a 1/10. Minimal swelling but has this at end of the day. Pain mainly lateral ankle and now only with certain movements. No prior ankle injuries or surgeries. Icing and taking tylenol. Used ASO for first week and with prolonged walking.  Past Medical History  Diagnosis Date  . Migraine   . Chronic sinusitis     Current Outpatient Prescriptions on File Prior to Visit  Medication Sig Dispense Refill  . calcium carbonate 200 MG capsule Take 250 mg by mouth daily.        [name] Kitchen estrogens, conjugated, ([name]) 0.625 MG tablet Take 0.625 mg by mouth daily.        . Multiple Vitamin (MULTIVITAMIN) tablet Take 1 tablet by  mouth daily.        . Omega-3 Fatty Acids (FISH OIL PO) Take 1 tablet by mouth daily.        . propranolol (INDERAL LA) 80 MG 24 hr capsule Take 80 mg by mouth daily.          Past Surgical History  Procedure Date  . Cholecystectomy   . Abdominal hysterectomy   . Nasal sinus surgery 2007, 2010    Allergies  Allergen Reactions  . Aspirin   . Ibuprofen   . Reglan   . Sulfa Antibiotics   . Zithromax (Azithromycin Dihydrate)     History   Social History  . Marital Status: Married    Spouse Name: N/A    Number of Children: N/A  . Years of Education: N/A   Occupational History  . Not on file.   Social History Main Topics  . Smoking status: Former Games developer  . Smokeless tobacco: Never Used  . Alcohol Use: Not on file  . Drug Use: Not on file  . Sexually Active: Not on file   Other Topics Concern  . Not on file   Social History Narrative  . No narrative on file    Family History  Problem Relation Age of Onset  . Diabetes Mother   . Diabetes Father   . Heart attack Father   . Hypertension Father   . Diabetes Maternal Grandfather   . Heart attack  Maternal Grandfather   . Hypertension Maternal Grandfather   . Diabetes Paternal Grandfather   . Heart attack Paternal Grandfather   . Hypertension Paternal Grandfather     BP 105/73  Pulse 111  Temp(Src) 97.9 F (36.6 C) (Oral)  Ht 5\' 6"  (1.676 m)  Wt 169 lb (76.658 kg)  BMI 27.28 kg/m2  Review of Systems  See HPI above.    Objective:   Physical Exam  Gen: NAD  L shoulder: No gross deformity, swelling, or bruising. No focal TTP AC joint, biceps tendon. FROM with painful arc. + empty can but 5/5 strength with this, resisted IR/ER. Mild + Hawkins NVI distally.  L ankle: No gross deformity, swelling, bruising. No TTP base 5th, navicular, posterior aspects of medial and lateral malleoli, fibular head, at ATFL, or elsewhere foot and ankle. FROM in all planes 1+ ant drawer, 1+ talar tilt (no pain). NVI  distally.     Assessment & Plan:  1. L shoulder pain - 2/2 rotator cuff impingement, rotator cuff tendinopathy.  Patient has completed 6 weeks of PT and is compliant with HEP.  Taking tylenol and icing, have done 2 subacromial cortisone injections and only mildly improving.  We discussed natural history of rotator cuff tendinopathy, impingement with continued rotator cuff strengthening.  Discussed what surgical intervention entails (decompression, acromioplasty, DCE) and offered her referral to discuss specifics which she would like to do.  2. L ankle sprain - significantly improved.  Given theraband home exercises, balance exercises.  Icing, tylenol as needed.  ASO for next 4 weeks when on uneven ground or doing a lot of walking.  F/u prn for this issue.

## 2011-05-31 ENCOUNTER — Other Ambulatory Visit: Payer: Self-pay | Admitting: Orthopedic Surgery

## 2011-05-31 ENCOUNTER — Encounter (HOSPITAL_BASED_OUTPATIENT_CLINIC_OR_DEPARTMENT_OTHER): Payer: Self-pay | Admitting: *Deleted

## 2011-05-31 NOTE — Progress Notes (Signed)
Pt in [place] Will come in [place] 8am-may need ekg-takes inderal for migraines

## 2011-06-03 ENCOUNTER — Encounter (HOSPITAL_BASED_OUTPATIENT_CLINIC_OR_DEPARTMENT_OTHER)
Admission: RE | Disposition: A | Discharge status: Home / Self Care | Payer: Self-pay | Source: Ambulatory Visit | Attending: Orthopedic Surgery

## 2011-06-03 ENCOUNTER — Ambulatory Visit (HOSPITAL_BASED_OUTPATIENT_CLINIC_OR_DEPARTMENT_OTHER)
Admission: RE | Admit: 2011-06-03 | Discharge: 2011-06-03 | Disposition: A | Discharge status: Home / Self Care | Payer: 59 | Source: Ambulatory Visit | Attending: Orthopedic Surgery | Admitting: Orthopedic Surgery

## 2011-06-03 ENCOUNTER — Encounter (HOSPITAL_BASED_OUTPATIENT_CLINIC_OR_DEPARTMENT_OTHER): Payer: Self-pay | Admitting: Anesthesiology

## 2011-06-03 ENCOUNTER — Encounter (HOSPITAL_BASED_OUTPATIENT_CLINIC_OR_DEPARTMENT_OTHER): Payer: Self-pay | Admitting: *Deleted

## 2011-06-03 ENCOUNTER — Ambulatory Visit (HOSPITAL_BASED_OUTPATIENT_CLINIC_OR_DEPARTMENT_OTHER): Payer: 59 | Admitting: Anesthesiology

## 2011-06-03 DIAGNOSIS — K219 Gastro-esophageal reflux disease without esophagitis: Secondary | ICD-10-CM | POA: Insufficient documentation

## 2011-06-03 DIAGNOSIS — R29898 Other symptoms and signs involving the musculoskeletal system: Secondary | ICD-10-CM | POA: Insufficient documentation

## 2011-06-03 DIAGNOSIS — M67449 Ganglion, unspecified hand: Secondary | ICD-10-CM

## 2011-06-03 HISTORY — DX: Pain in unspecified joint: M25.50

## 2011-06-03 HISTORY — PX: MASS EXCISION: SHX2000

## 2011-06-03 LAB — POCT HEMOGLOBIN-HEMACUE: Hemoglobin: 12.9 g/dL (ref 12.0–15.0)

## 2011-06-03 SURGERY — EXCISION MASS
Anesthesia: General | Site: Finger | Laterality: Right | Wound class: Clean

## 2011-06-03 MED ORDER — LIDOCAINE HCL (CARDIAC) 20 MG/ML IV SOLN
INTRAVENOUS | Status: DC | PRN
  Administered 2011-06-03: 20 mg via INTRAVENOUS

## 2011-06-03 MED ORDER — FENTANYL CITRATE 0.05 MG/ML IJ SOLN: 25.0000 ug | INTRAMUSCULAR | Status: DC | PRN

## 2011-06-03 MED ORDER — BUPIVACAINE HCL (PF) 0.25 % IJ SOLN
INTRAMUSCULAR | Status: DC | PRN
  Administered 2011-06-03: 3 mL

## 2011-06-03 MED ORDER — SCOPOLAMINE 1 MG/3DAYS TD PT72
1.0000 | MEDICATED_PATCH | TRANSDERMAL | Status: DC
  Administered 2011-06-03: 1.5 mg via TRANSDERMAL

## 2011-06-03 MED ORDER — LACTATED RINGERS IV SOLN
INTRAVENOUS | Status: DC
  Administered 2011-06-03: 20 mL/h via INTRAVENOUS
  Administered 2011-06-03: 09:00:00 via INTRAVENOUS

## 2011-06-03 MED ORDER — OXYCODONE-ACETAMINOPHEN 5-325 MG PO TABS: 1.0000 | ORAL_TABLET | ORAL | Status: AC | PRN

## 2011-06-03 MED ORDER — PROPOFOL 10 MG/ML IV EMUL
INTRAVENOUS | Status: DC | PRN
  Administered 2011-06-03: 50 mg via INTRAVENOUS
  Administered 2011-06-03: 200 mg via INTRAVENOUS
  Administered 2011-06-03: 50 mg via INTRAVENOUS

## 2011-06-03 MED ORDER — CEFAZOLIN SODIUM 1-5 GM-% IV SOLN
1.0000 g | INTRAVENOUS | Status: AC
  Administered 2011-06-03: 1 g via INTRAVENOUS

## 2011-06-03 MED ORDER — OXYCODONE-ACETAMINOPHEN 5-325 MG PO TABS
1.0000 | ORAL_TABLET | Freq: Once | ORAL | Status: AC
  Administered 2011-06-03: 1 via ORAL

## 2011-06-03 MED ORDER — ONDANSETRON HCL 4 MG/2ML IJ SOLN
INTRAMUSCULAR | Status: DC | PRN
  Administered 2011-06-03: 4 mg via INTRAVENOUS

## 2011-06-03 MED ORDER — CHLORHEXIDINE GLUCONATE 4 % EX LIQD: 60.0000 mL | Freq: Once | CUTANEOUS | Status: DC

## 2011-06-03 MED ORDER — MIDAZOLAM HCL 5 MG/5ML IJ SOLN
INTRAMUSCULAR | Status: DC | PRN
  Administered 2011-06-03: 2 mg via INTRAVENOUS

## 2011-06-03 MED ORDER — DEXAMETHASONE SODIUM PHOSPHATE 4 MG/ML IJ SOLN
INTRAMUSCULAR | Status: DC | PRN
  Administered 2011-06-03: 10 mg via INTRAVENOUS

## 2011-06-03 SURGICAL SUPPLY — 48 items
APL SKNCLS STERI-STRIP NONHPOA (GAUZE/BANDAGES/DRESSINGS)
BAG DECANTER FOR FLEXI CONT (MISCELLANEOUS) IMPLANT
BANDAGE ELASTIC 3 VELCRO ST LF (GAUZE/BANDAGES/DRESSINGS) IMPLANT
BANDAGE ELASTIC 4 VELCRO ST LF (GAUZE/BANDAGES/DRESSINGS) IMPLANT
BANDAGE GAUZE ELAST BULKY 4 IN (GAUZE/BANDAGES/DRESSINGS) ×2 IMPLANT
BENZOIN TINCTURE PRP APPL 2/3 (GAUZE/BANDAGES/DRESSINGS) IMPLANT
BLADE SURG 15 STRL LF DISP TIS (BLADE) ×1 IMPLANT
BLADE SURG 15 STRL SS (BLADE) ×2
BNDG CMPR 9X4 STRL LF SNTH (GAUZE/BANDAGES/DRESSINGS) ×1
BNDG COHESIVE 1X5 TAN STRL LF (GAUZE/BANDAGES/DRESSINGS) ×2 IMPLANT
BNDG ESMARK 4X9 LF (GAUZE/BANDAGES/DRESSINGS) ×2 IMPLANT
CLOTH BEACON ORANGE TIMEOUT ST (SAFETY) ×2 IMPLANT
CORDS BIPOLAR (ELECTRODE) ×4 IMPLANT
COVER TABLE BACK 60X90 (DRAPES) ×2 IMPLANT
CUFF TOURNIQUET SINGLE 18IN (TOURNIQUET CUFF) ×2 IMPLANT
DECANTER SPIKE VIAL GLASS SM (MISCELLANEOUS) IMPLANT
DRAPE EXTREMITY T 121X128X90 (DRAPE) ×2 IMPLANT
DRAPE SURG 17X23 STRL (DRAPES) ×2 IMPLANT
DURAPREP 26ML APPLICATOR (WOUND CARE) ×2 IMPLANT
GAUZE XEROFORM 1X8 LF (GAUZE/BANDAGES/DRESSINGS) ×2 IMPLANT
GLOVE BIO SURGEON STRL SZ 6.5 (GLOVE) ×2 IMPLANT
GLOVE BIO SURGEON STRL SZ8.5 (GLOVE) ×2 IMPLANT
GLOVE BIOGEL PI IND STRL 7.0 (GLOVE) ×1 IMPLANT
GLOVE BIOGEL PI INDICATOR 7.0 (GLOVE) ×1
GOWN PREVENTION PLUS XLARGE (GOWN DISPOSABLE) ×2 IMPLANT
GOWN PREVENTION PLUS XXLARGE (GOWN DISPOSABLE) ×2 IMPLANT
NEEDLE HYPO 25X1 1.5 SAFETY (NEEDLE) ×2 IMPLANT
NS IRRIG 1000ML POUR BTL (IV SOLUTION) ×2 IMPLANT
PACK BASIN DAY SURGERY FS (CUSTOM PROCEDURE TRAY) ×2 IMPLANT
PAD CAST 3X4 CTTN HI CHSV (CAST SUPPLIES) IMPLANT
PADDING CAST COTTON 3X4 STRL (CAST SUPPLIES)
SHEET MEDIUM DRAPE 40X70 STRL (DRAPES) ×2 IMPLANT
SPLINT PLASTER CAST XFAST 4X15 (CAST SUPPLIES) IMPLANT
SPLINT PLASTER XTRA FAST SET 4 (CAST SUPPLIES)
SPONGE GAUZE 4X4 12PLY (GAUZE/BANDAGES/DRESSINGS) ×2 IMPLANT
STOCKINETTE 4X48 STRL (DRAPES) ×2 IMPLANT
STRIP CLOSURE SKIN 1/2X4 (GAUZE/BANDAGES/DRESSINGS) IMPLANT
SUT ETHILON 5 0 PS 2 18 (SUTURE) IMPLANT
SUT PROLENE 3 0 PS 2 (SUTURE) IMPLANT
SUT VIC AB 4-0 P-3 18XBRD (SUTURE) IMPLANT
SUT VIC AB 4-0 P3 18 (SUTURE)
SUT VICRYL RAPIDE 4/0 PS 2 (SUTURE) ×2 IMPLANT
SYR BULB 3OZ (MISCELLANEOUS) IMPLANT
SYR CONTROL 10ML LL (SYRINGE) ×2 IMPLANT
SYRINGE 10CC LL (SYRINGE) IMPLANT
TOWEL OR 17X24 6PK STRL BLUE (TOWEL DISPOSABLE) ×2 IMPLANT
UNDERPAD 30X30 INCONTINENT (UNDERPADS AND DIAPERS) ×2 IMPLANT
WATER STERILE IRR 1000ML POUR (IV SOLUTION) IMPLANT

## 2011-06-03 NOTE — Brief Op Note (Signed)
06/03/2011  9:39 AM  PATIENT:  Josefine Class Dost  46 y.o. female  PRE-OPERATIVE DIAGNOSIS:  mass right middle finger  POST-OPERATIVE DIAGNOSIS:  mass right middle finger  PROCEDURE:  Procedure(s) (LRB): EXCISION MASS (Right)  SURGEON:  Surgeon(s) and Role:    * Marlowe Shores, MD - Primary  PHYSICIAN ASSISTANT:   ASSISTANTS: none   ANESTHESIA:   general  EBL:     BLOOD ADMINISTERED:none  DRAINS: none   LOCAL MEDICATIONS USED:  MARCAINE     SPECIMEN:  Excision  DISPOSITION OF SPECIMEN:  PATHOLOGY  COUNTS:  YES  TOURNIQUET:   Total Tourniquet Time Documented: Upper Arm (Right) - 14 minutes  DICTATION: .Other Dictation: Dictation Number [id number]  PLAN OF CARE: Discharge to home after PACU  PATIENT DISPOSITION:  PACU - hemodynamically stable.   Delay start of Pharmacological VTE agent (>24hrs) due to surgical blood loss or risk of bleeding: not applicable

## 2011-06-03 NOTE — Discharge Instructions (Signed)

## 2011-06-03 NOTE — Anesthesia Procedure Notes (Signed)
Procedure Name: LMA Insertion Date/Time: 06/03/2011 9:18 AM Performed by: Gar Gibbon Pre-anesthesia Checklist: Patient identified, Emergency Drugs available, Suction available and Patient being monitored Patient Re-evaluated:Patient Re-evaluated prior to inductionOxygen Delivery Method: Circle System Utilized Preoxygenation: Pre-oxygenation with 100% oxygen Intubation Type: IV induction Ventilation: Mask ventilation without difficulty LMA: LMA inserted LMA Size: 4.0 Number of attempts: 1 Airway Equipment and Method: bite block Placement Confirmation: positive ETCO2 and breath sounds checked- equal and bilateral Tube secured with: Tape Dental Injury: Teeth and Oropharynx as per pre-operative assessment

## 2011-06-03 NOTE — Op Note (Signed)
See dictated note [phone]

## 2011-06-03 NOTE — H&P (Signed)
Jody Blackwell is an 46 y.o. female.   Chief Complaint: right long dorsal distal mass HPI: 46 y/o female with enlarging dorsal mass  Past Medical History  Diagnosis Date  . Migraine   . Chronic sinusitis   . Joint pain     Past Surgical History  Procedure Date  . Nasal sinus surgery 2007, 2010  . Cholecystectomy 2012    lap choli  . Abdominal hysterectomy     Family History  Problem Relation Age of Onset  . Diabetes Mother   . Diabetes Father   . Heart attack Father   . Hypertension Father   . Diabetes Maternal Grandfather   . Heart attack Maternal Grandfather   . Hypertension Maternal Grandfather   . Diabetes Paternal Grandfather   . Heart attack Paternal Grandfather   . Hypertension Paternal Grandfather    Social History:  reports that she quit smoking about 22 years ago. She has never used smokeless tobacco. She reports that she drinks alcohol. She reports that she does not use illicit drugs.  Allergies:  Allergies  Allergen Reactions  . Aspirin   . Ibuprofen   . Reglan   . Sulfa Antibiotics   . Zithromax (Azithromycin Dihydrate)     Medications Prior to Admission  Medication Dose Route Frequency Provider Last Rate Last Dose  . ceFAZolin (ANCEF) IVPB 1 g/50 mL premix  1 g Intravenous 60 min Pre-Op Marlowe Shores, MD      . chlorhexidine (HIBICLENS) 4 % liquid 4 application  60 mL Topical Once Marlowe Shores, MD      . lactated ringers infusion   Intravenous Continuous Bedelia Person, MD       Medications Prior to Admission  Medication Sig Dispense Refill  . calcium carbonate 200 MG capsule Take 250 mg by mouth daily.        [name] Kitchen estrogens, conjugated, ([name]) 0.625 MG tablet Take 0.625 mg by mouth daily.        . Multiple Vitamin (MULTIVITAMIN) tablet Take 1 tablet by mouth daily.        . Omega-3 Fatty Acids (FISH OIL PO) Take 1 tablet by mouth daily.        . propranolol (INDERAL LA) 80 MG 24 hr capsule Take 80 mg by mouth daily.          No results  found for this or any previous visit (from the past 48 hour(s)). No results found.  Review of Systems  Constitutional: Negative.   HENT: Negative.   Eyes: Negative.   Respiratory: Negative.   Cardiovascular: Negative.   Gastrointestinal: Negative.   Genitourinary: Negative.   Musculoskeletal: Negative.   Skin: Negative.   Neurological: Negative.   Endo/Heme/Allergies: Negative.   Psychiatric/Behavioral: Negative.     Blood pressure 103/70, pulse 68, temperature 98 F (36.7 C), temperature source Oral, resp. rate 16, SpO2 100.00%. Physical Exam  Constitutional: She is oriented to person, place, and time. She appears well-developed and well-nourished.  HENT:  Head: Normocephalic and atraumatic.  Cardiovascular: Normal rate.   Respiratory: Effort normal.  Musculoskeletal:       Hands: Neurological: She is alert and oriented to person, place, and time.  Skin: Skin is warm.  Psychiatric: She has a normal mood and affect. Her speech is normal and behavior is normal. Thought content normal.     Assessment/Plan 46 y/o female with enlarging right long dorsal mass  Plan excision  Connie Hilgert A 06/03/2011, 8:59 AM

## 2011-06-03 NOTE — Transfer of Care (Signed)
Immediate Anesthesia Transfer of Care Note  Patient: Jody Blackwell  Procedure(s) Performed: Procedure(s) (LRB): EXCISION MASS (Right)  Patient Location: PACU  Anesthesia Type: General  Level of Consciousness: sedated  Airway & Oxygen Therapy: Patient Spontanous Breathing and Patient connected to face mask oxygen  Post-op Assessment: Report given to PACU RN and Post -op Vital signs reviewed and stable  Post vital signs: Reviewed and stable  Complications: No apparent anesthesia complications

## 2011-06-03 NOTE — Op Note (Signed)
NAME:  Jody Blackwell, Jody Blackwell                     ACCOUNT NO.:  MEDICAL RECORD NO.:  [id number]  LOCATION:                                 FACILITY:  PHYSICIAN:  Artist Pais. Mina Marble, M.D.   DATE OF BIRTH:  DATE OF PROCEDURE:  06/03/2011 DATE OF DISCHARGE:                              OPERATIVE REPORT   PREOPERATIVE DIAGNOSIS:  Enlarging mass, right long finger, radial side distal interphalangeal joint.  POSTOPERATIVE DIAGNOSES: 1. Enlarging mass, right long finger, radial side distal     interphalangeal joint. 2. Excisional biopsy, right long finger mass.  SURGEON:  Artist Pais. Mina Marble, M.D.  ASSISTANT:  None.  ANESTHESIA:  General..  COMPLICATION:  No complications.  DRAINS:  No drains.  SPECIMEN:  One specimen sent.  The patient was taken to the operating suite.  After induction of adequate general anesthesia, right upper extremity was prepped and draped in sterile fashion.  An Esmarch was used to exsanguinate the limb.  Tourniquet was inflated 250 mmHg.  At this point in time, an L- shaped incision was made over the distal interphalangeal joint of right long finger, and ulnar-based flap was elevated, a large cystic lesion was seen coming off the DIP joint area.  This was carefully excised in its entirety.  The DIP joint was entered.  There was a debridement of the DIP joint undertaken.  The wound was then thoroughly irrigated with loosely closed with 4-0 Vicryl Rapide suture.  Xeroform, 4x4s, and compression Coban wrap was applied.  The patient tolerated the procedure well and went to recovery room in stable fashion.     Artist Pais Mina Marble, M.D.     MAW/MEDQ  D:  06/03/2011  T:  06/03/2011  Job:  [id number]

## 2011-06-03 NOTE — Anesthesia Postprocedure Evaluation (Signed)
  Anesthesia Post-op Note  Patient: Jody Blackwell  Procedure(s) Performed: Procedure(s) (LRB): EXCISION MASS (Right)  Patient Location: PACU  Anesthesia Type: General  Level of Consciousness: awake, alert  and oriented  Airway and Oxygen Therapy: Patient Spontanous Breathing  Post-op Pain: none  Post-op Assessment: Post-op Vital signs reviewed, Patient's Cardiovascular Status Stable, Respiratory Function Stable, Patent Airway, No signs of Nausea or vomiting, Adequate PO intake and Pain level controlled  Post-op Vital Signs: Reviewed and stable  Complications: No apparent anesthesia complications

## 2011-06-03 NOTE — Anesthesia Preprocedure Evaluation (Signed)
Anesthesia Evaluation  Patient identified by MRN, date of birth, ID band  Reviewed: Allergy & Precautions, H&P , NPO status , Patient's Chart, lab work & pertinent test results  History of Anesthesia Complications (+) PONV  Airway Mallampati: I TM Distance: >3 FB Neck ROM: Full    Dental No notable dental hx. (+) Dental Advisory Given, Teeth Intact and Caps   Pulmonary neg pulmonary ROS, former smoker breath sounds clear to auscultation  Pulmonary exam normal       Cardiovascular negative cardio ROS  Rhythm:Regular Rate:Normal     Neuro/Psych negative neurological ROS     GI/Hepatic Neg liver ROS, GERD-  Controlled,  Endo/Other  negative endocrine ROS  Renal/GU negative Renal ROS     Musculoskeletal   Abdominal (+) + obese,   Peds  Hematology negative hematology ROS (+)   Anesthesia Other Findings   Reproductive/Obstetrics                           Anesthesia Physical Anesthesia Plan  ASA: I  Anesthesia Plan: General   Post-op Pain Management:    Induction: Intravenous  Airway Management Planned: LMA  Additional Equipment:   Intra-op Plan:   Post-operative Plan:   Informed Consent:   Dental advisory given  Plan Discussed with: CRNA and Surgeon  Anesthesia Plan Comments: (Plan routine monitors, GA- LMA ok     Patient and surgeon prefer GA)        Anesthesia Quick Evaluation

## 2011-06-04 ENCOUNTER — Encounter (HOSPITAL_BASED_OUTPATIENT_CLINIC_OR_DEPARTMENT_OTHER): Payer: Self-pay | Admitting: Orthopedic Surgery

## 2011-06-24 ENCOUNTER — Other Ambulatory Visit: Payer: Self-pay | Admitting: Family Medicine

## 2011-06-24 DIAGNOSIS — Z1231 Encounter for screening mammogram for malignant neoplasm of breast: Secondary | ICD-10-CM

## 2011-07-10 ENCOUNTER — Ambulatory Visit
Admission: RE | Admit: 2011-07-10 | Discharge: 2011-07-10 | Disposition: A | Discharge status: Home / Self Care | Payer: 59 | Source: Ambulatory Visit | Attending: Family Medicine | Admitting: Family Medicine

## 2011-07-10 DIAGNOSIS — Z1231 Encounter for screening mammogram for malignant neoplasm of breast: Secondary | ICD-10-CM

## 2011-12-23 ENCOUNTER — Ambulatory Visit (HOSPITAL_COMMUNITY)
Admission: RE | Admit: 2011-12-23 | Discharge: 2011-12-23 | Disposition: A | Discharge status: Home / Self Care | Payer: 59 | Source: Ambulatory Visit | Attending: Physician Assistant | Admitting: Physician Assistant

## 2011-12-23 ENCOUNTER — Other Ambulatory Visit (HOSPITAL_COMMUNITY): Payer: Self-pay | Admitting: Physician Assistant

## 2011-12-23 DIAGNOSIS — M25559 Pain in unspecified hip: Secondary | ICD-10-CM | POA: Insufficient documentation

## 2011-12-23 DIAGNOSIS — M76899 Other specified enthesopathies of unspecified lower limb, excluding foot: Secondary | ICD-10-CM | POA: Insufficient documentation

## 2012-01-13 ENCOUNTER — Other Ambulatory Visit (HOSPITAL_COMMUNITY): Payer: Self-pay | Admitting: Neurosurgery

## 2012-01-13 DIAGNOSIS — M545 Low back pain, unspecified: Secondary | ICD-10-CM

## 2012-01-16 ENCOUNTER — Ambulatory Visit (HOSPITAL_COMMUNITY)
Admission: RE | Admit: 2012-01-16 | Discharge: 2012-01-16 | Disposition: A | Discharge status: Home / Self Care | Payer: 59 | Source: Ambulatory Visit | Attending: Neurosurgery | Admitting: Neurosurgery

## 2012-01-16 DIAGNOSIS — D179 Benign lipomatous neoplasm, unspecified: Secondary | ICD-10-CM | POA: Insufficient documentation

## 2012-01-16 DIAGNOSIS — M545 Low back pain, unspecified: Secondary | ICD-10-CM | POA: Insufficient documentation

## 2012-01-23 ENCOUNTER — Encounter (HOSPITAL_BASED_OUTPATIENT_CLINIC_OR_DEPARTMENT_OTHER): Payer: Self-pay | Admitting: *Deleted

## 2012-01-23 ENCOUNTER — Emergency Department (HOSPITAL_BASED_OUTPATIENT_CLINIC_OR_DEPARTMENT_OTHER)
Admission: EM | Admit: 2012-01-23 | Discharge: 2012-01-23 | Disposition: A | Discharge status: Home / Self Care | Payer: 59 | Attending: Emergency Medicine | Admitting: Emergency Medicine

## 2012-01-23 DIAGNOSIS — S61209A Unspecified open wound of unspecified finger without damage to nail, initial encounter: Secondary | ICD-10-CM | POA: Insufficient documentation

## 2012-01-23 DIAGNOSIS — S61219A Laceration without foreign body of unspecified finger without damage to nail, initial encounter: Secondary | ICD-10-CM

## 2012-01-23 DIAGNOSIS — Z8709 Personal history of other diseases of the respiratory system: Secondary | ICD-10-CM | POA: Insufficient documentation

## 2012-01-23 DIAGNOSIS — Z8669 Personal history of other diseases of the nervous system and sense organs: Secondary | ICD-10-CM | POA: Insufficient documentation

## 2012-01-23 DIAGNOSIS — Y93G1 Activity, food preparation and clean up: Secondary | ICD-10-CM | POA: Insufficient documentation

## 2012-01-23 DIAGNOSIS — Z79899 Other long term (current) drug therapy: Secondary | ICD-10-CM | POA: Insufficient documentation

## 2012-01-23 DIAGNOSIS — Z87891 Personal history of nicotine dependence: Secondary | ICD-10-CM | POA: Insufficient documentation

## 2012-01-23 DIAGNOSIS — W260XXA Contact with knife, initial encounter: Secondary | ICD-10-CM | POA: Insufficient documentation

## 2012-01-23 DIAGNOSIS — Z8739 Personal history of other diseases of the musculoskeletal system and connective tissue: Secondary | ICD-10-CM | POA: Insufficient documentation

## 2012-01-23 DIAGNOSIS — Y929 Unspecified place or not applicable: Secondary | ICD-10-CM | POA: Insufficient documentation

## 2012-01-23 MED ORDER — LIDOCAINE HCL (PF) 1 % IJ SOLN: 5.0000 mL | Freq: Once | INTRAMUSCULAR | Status: DC

## 2012-01-23 MED ORDER — LIDOCAINE HCL (PF) 1 % IJ SOLN
INTRAMUSCULAR | Status: AC
  Filled 2012-01-23: qty 5

## 2012-01-23 NOTE — ED Provider Notes (Signed)
History     CSN: [id number] Arrival date & time 01/23/12  1017  First MD Initiated Contact with Patient 01/23/12 1034      Chief Complaint  Patient presents with  . Extremity Laceration    Patient is a 46 y.o. female presenting with skin laceration. The history is provided by the patient.  Laceration  Incident onset: this morning. Pain location: left index finger. Size: 1.5. The laceration mechanism was a a clean knife (Pt was cutting fruit this am when it slipeed and she lacerated the tip of her left index finger.). The pain is mild. The pain has been constant since onset. She reports no foreign bodies present. Her tetanus status is UTD.  She applied pressure and the bleeding stopped.  Denies any numbness or limitation in her range of motion.  Past Medical History  Diagnosis Date  . Migraine   . Chronic sinusitis   . Joint pain     Past Surgical History  Procedure Date  . Nasal sinus surgery 2007, 2010  . Cholecystectomy 2012    lap choli  . Abdominal hysterectomy   . Mass excision 06/03/2011    Procedure: EXCISION MASS;  Surgeon: Marlowe Shores, MD;  Location: [facility];  Service: Orthopedics;  Laterality: Right;  EXCISION MASS RIGHT MIDDLE FINGER   . Cesarean section     x 2    Family History  Problem Relation Age of Onset  . Diabetes Mother   . Diabetes Father   . Heart attack Father   . Hypertension Father   . Diabetes Maternal Grandfather   . Heart attack Maternal Grandfather   . Hypertension Maternal Grandfather   . Diabetes Paternal Grandfather   . Heart attack Paternal Grandfather   . Hypertension Paternal Grandfather     History  Substance Use Topics  . Smoking status: Former Smoker    Quit date: 05/30/1989  . Smokeless tobacco: Never Used  . Alcohol Use: No    OB History    Grav Para Term Preterm Abortions TAB SAB Ect Mult Living                  Review of Systems  All other systems reviewed and are  negative.    Allergies  Aspirin; Ibuprofen; Metoclopramide hcl; Sulfa antibiotics; Triptans; and Zithromax  Home Medications   Current Outpatient Rx  Name  Route  Sig  Dispense  Refill  . BACLOFEN 10 MG PO TABS   Oral   Take 10 mg by mouth as needed.         [name] Kitchen FAMOTIDINE 10 MG PO TABS   Oral   Take 10 mg by mouth 2 (two) times daily.         . TOPIRAMATE 25 MG PO TABS   Oral   Take 75 mg by mouth 2 (two) times daily.         [name] Kitchen VITAMIN C 500 MG PO TABS   Oral   Take 500 mg by mouth 2 (two) times daily.         [name] Kitchen CALCIUM CARBONATE 200 MG PO CAPS   Oral   Take 250 mg by mouth daily.           [name] Kitchen DICLOFENAC SODIUM 75 MG PO TBEC   Oral   Take 75 mg by mouth 2 (two) times daily.         [name] Kitchen ESTROGENS CONJUGATED 0.625 MG PO TABS   Oral   Take 0.625  mg by mouth daily.           [name] Kitchen ONE-DAILY MULTI VITAMINS PO TABS   Oral   Take 1 tablet by mouth daily.           [name] Kitchen FISH OIL PO   Oral   Take 1 tablet by mouth daily.           [name] Kitchen PROPRANOLOL HCL 80 MG PO CP24   Oral   Take 80 mg by mouth daily.             BP 134/69  Pulse 116  Temp 98.3 F (36.8 C) (Oral)  Resp 22  Ht 5\' 5"  (1.651 m)  Wt 185 lb (83.915 kg)  BMI 30.79 kg/m2  SpO2 100%  Physical Exam  Nursing note and vitals reviewed. Constitutional: She appears well-developed and well-nourished. No distress.  HENT:  Head: Normocephalic and atraumatic.  Right Ear: External ear normal.  Left Ear: External ear normal.  Eyes: Conjunctivae normal are normal. Right eye exhibits no discharge. Left eye exhibits no discharge. No scleral icterus.  Neck: Neck supple. No tracheal deviation present.  Cardiovascular: Normal rate.   Pulmonary/Chest: Effort normal. No stridor. No respiratory distress.  Musculoskeletal: She exhibits no edema.       Left hand: She exhibits tenderness and laceration. She exhibits normal range of motion, normal capillary refill and no deformity. normal sensation noted. Normal  strength noted.       1.5 cm laceration left index finger  radial aspect  Distal adjacent to dip joint, no flexor tendon laceration noted, no vascular injury  Neurological: She is alert. Cranial nerve deficit: no gross deficits.  Skin: Skin is warm and dry. No rash noted.  Psychiatric: She has a normal mood and affect.    ED Course  LACERATION REPAIR Performed by: Celene Kras Authorized by: Linwood Dibbles R Consent: Verbal consent obtained. Consent given by: patient Patient identity confirmed: verbally with patient Time out: Immediately prior to procedure a "time out" was called to verify the correct patient, procedure, equipment, support staff and site/side marked as required. Body area: upper extremity Location details: left index finger Laceration length: 1.5 cm Foreign bodies: no foreign bodies Tendon involvement: none Nerve involvement: none Vascular damage: no Anesthesia: local infiltration Local anesthetic: lidocaine 1% without epinephrine Anesthetic total: 2.5 ml Patient sedated: no Preparation: Patient was prepped and draped in the usual sterile fashion. Irrigation solution: saline Irrigation method: jet lavage Amount of cleaning: standard Debridement: none Degree of undermining: none Skin closure: 5-0 nylon Number of sutures: 3 Technique: simple Approximation: close Approximation difficulty: simple Dressing: antibiotic ointment and 4x4 sterile gauze Patient tolerance: Patient tolerated the procedure well with no immediate complications.   (including critical care time)  Labs Reviewed - No data to display No results found.   1. Finger laceration       MDM  Tolerated repair well.  No sign of neurovascular on tendon injury.       Celene Kras, MD 01/23/12 [phone]

## 2012-01-23 NOTE — ED Notes (Signed)
Patient states she was cutting fruit and the knife slipped and has a laceration to left index finger.

## 2012-02-06 ENCOUNTER — Emergency Department (HOSPITAL_COMMUNITY)
Admission: EM | Admit: 2012-02-06 | Discharge: 2012-02-06 | Disposition: A | Discharge status: Home / Self Care | Payer: 59 | Source: Home / Self Care | Attending: Emergency Medicine | Admitting: Emergency Medicine

## 2012-02-06 ENCOUNTER — Encounter (HOSPITAL_COMMUNITY): Payer: Self-pay | Admitting: Emergency Medicine

## 2012-02-06 DIAGNOSIS — S39012A Strain of muscle, fascia and tendon of lower back, initial encounter: Secondary | ICD-10-CM

## 2012-02-06 DIAGNOSIS — S335XXA Sprain of ligaments of lumbar spine, initial encounter: Secondary | ICD-10-CM

## 2012-02-06 LAB — POCT URINALYSIS DIP (DEVICE)
Leukocytes, UA: NEGATIVE
Nitrite: NEGATIVE
Protein, ur: NEGATIVE mg/dL
pH: 5.5 (ref 5.0–8.0)

## 2012-02-06 MED ORDER — METHYLPREDNISOLONE 4 MG PO KIT: PACK | ORAL | Status: DC

## 2012-02-06 NOTE — ED Notes (Signed)
Waiting discharge papers 

## 2012-02-06 NOTE — ED Notes (Signed)
Pt was credited for the upreg that was done.  Order was placed by CMA and I ran test.  Test was cancelled after I had already ran it and resulted it.  Pt should NOT get charged for PREG.

## 2012-02-06 NOTE — ED Provider Notes (Signed)
Medical screening examination/treatment/procedure(s) were performed by non-physician practitioner and as supervising physician I was immediately available for consultation/collaboration.  Leslee Home, M.D.   Reuben Likes, MD 02/06/12 [phone]

## 2012-02-06 NOTE — ED Provider Notes (Signed)
History     CSN: [id number]  Arrival date & time 02/06/12  1116   First MD Initiated Contact with Patient 02/06/12 1233      Chief Complaint  Patient presents with  . Back Pain    lower back pain/left side pain    (Consider location/radiation/quality/duration/timing/severity/associated sxs/prior treatment) Patient is a 46 y.o. female presenting with flank pain. The history is provided by the patient.  Flank Pain This is a new problem. The current episode started more than 2 days ago. The problem occurs daily. The problem has not changed since onset.Associated symptoms include abdominal pain.  hx of cystocopy related  Hx chronic uti during pregnancy.  Past Medical History  Diagnosis Date  . Migraine   . Chronic sinusitis   . Joint pain     Past Surgical History  Procedure Date  . Nasal sinus surgery 2007, 2010  . Cholecystectomy 2012    lap choli  . Abdominal hysterectomy   . Mass excision 06/03/2011    Procedure: EXCISION MASS;  Surgeon: Marlowe Shores, MD;  Location: [facility];  Service: Orthopedics;  Laterality: Right;  EXCISION MASS RIGHT MIDDLE FINGER   . Cesarean section     x 2    Family History  Problem Relation Age of Onset  . Diabetes Mother   . Diabetes Father   . Heart attack Father   . Hypertension Father   . Diabetes Maternal Grandfather   . Heart attack Maternal Grandfather   . Hypertension Maternal Grandfather   . Diabetes Paternal Grandfather   . Heart attack Paternal Grandfather   . Hypertension Paternal Grandfather     History  Substance Use Topics  . Smoking status: Former Smoker    Quit date: 05/30/1989  . Smokeless tobacco: Never Used  . Alcohol Use: No    OB History    Grav Para Term Preterm Abortions TAB SAB Ect Mult Living                  Review of Systems  Constitutional: Positive for chills.  Gastrointestinal: Positive for nausea and abdominal pain.  Genitourinary: Positive for dysuria and flank  pain.  All other systems reviewed and are negative.    Allergies  Aspirin; Ibuprofen; Metoclopramide hcl; Sulfa antibiotics; Triptans; and Zithromax  Home Medications   Current Outpatient Rx  Name  Route  Sig  Dispense  Refill  . BACLOFEN 10 MG PO TABS   Oral   Take 10 mg by mouth as needed.         [name] Kitchen CALCIUM CARBONATE 200 MG PO CAPS   Oral   Take 250 mg by mouth daily.           [name] Kitchen DICLOFENAC SODIUM 75 MG PO TBEC   Oral   Take 75 mg by mouth 2 (two) times daily.         [name] Kitchen ESTROGENS CONJUGATED 0.625 MG PO TABS   Oral   Take 0.625 mg by mouth daily.           [name] Kitchen FAMOTIDINE 10 MG PO TABS   Oral   Take 10 mg by mouth 2 (two) times daily.         [name] Kitchen ONE-DAILY MULTI VITAMINS PO TABS   Oral   Take 1 tablet by mouth daily.           [name] Kitchen FISH OIL PO   Oral   Take 1 tablet by mouth daily.           [name] Kitchen  PROPRANOLOL HCL 80 MG PO CP24   Oral   Take 80 mg by mouth daily.           . TOPIRAMATE 25 MG PO TABS   Oral   Take 75 mg by mouth 2 (two) times daily.         [name] Kitchen VITAMIN C 500 MG PO TABS   Oral   Take 500 mg by mouth 2 (two) times daily.         . METHYLPREDNISOLONE 4 MG PO KIT      follow package directions   21 tablet   0     BP 113/62  Pulse 61  Temp 97.5 F (36.4 C) (Oral)  Resp 16  SpO2 100%  Physical Exam  Nursing note and vitals reviewed. Constitutional: She is oriented to person, place, and time. Vital signs are normal. She appears well-developed and well-nourished. She is active and cooperative.  HENT:  Head: Normocephalic.  Eyes: Conjunctivae normal are normal. Pupils are equal, round, and reactive to light. No scleral icterus.  Neck: Trachea normal, normal range of motion and full passive range of motion without pain. Neck supple.  Cardiovascular: Normal rate, regular rhythm, normal heart sounds, intact distal pulses and normal pulses.   Pulmonary/Chest: Effort normal and breath sounds normal.  Abdominal: Soft. Normal  appearance and bowel sounds are normal. There is no tenderness. There is no rebound.  Musculoskeletal:       Cervical back: Normal.       Thoracic back: Normal.       Lumbar back: Normal.       Back:  Lymphadenopathy:    She has no cervical adenopathy.  Neurological: She is alert and oriented to person, place, and time. She has normal strength and normal reflexes. No cranial nerve deficit or sensory deficit. Coordination and gait normal.  Skin: Skin is warm and dry. No rash noted.  Psychiatric: She has a normal mood and affect. Her speech is normal and behavior is normal. Judgment and thought content normal. Cognition and memory are normal.    ED Course  Procedures (including critical care time)   Labs Reviewed  POCT URINALYSIS DIP (DEVICE)  POCT PREGNANCY, URINE   No results found.   1. Lumbar strain       MDM  Pt reports her neurologist does not want her to take NSAIDS related migraine prevention.  Medrol dose pak prescribed- take medications as prescribed.        Johnsie Kindred, NP 02/06/12 1247

## 2012-02-06 NOTE — ED Notes (Signed)
Pt c/o lower back pain and left sided kidney pain x 2 to 3 days. Pt denies urinary symptoms. Pt has tried muscle relaxers with no relief in pain. Pt has a hx of uti's.

## 2012-06-01 ENCOUNTER — Other Ambulatory Visit: Payer: Self-pay

## 2012-06-01 DIAGNOSIS — Z1231 Encounter for screening mammogram for malignant neoplasm of breast: Secondary | ICD-10-CM

## 2012-07-10 ENCOUNTER — Ambulatory Visit
Admission: RE | Admit: 2012-07-10 | Discharge: 2012-07-10 | Disposition: A | Discharge status: Home / Self Care | Payer: 59 | Source: Ambulatory Visit

## 2012-07-10 DIAGNOSIS — Z1231 Encounter for screening mammogram for malignant neoplasm of breast: Secondary | ICD-10-CM

## 2013-06-12 ENCOUNTER — Encounter: Payer: Self-pay | Admitting: *Deleted

## 2013-08-19 ENCOUNTER — Encounter: Payer: Self-pay | Admitting: *Deleted

## 2013-09-10 ENCOUNTER — Other Ambulatory Visit: Payer: Self-pay

## 2013-09-10 DIAGNOSIS — Z1231 Encounter for screening mammogram for malignant neoplasm of breast: Secondary | ICD-10-CM

## 2013-09-24 ENCOUNTER — Ambulatory Visit: Payer: 59

## 2013-10-19 ENCOUNTER — Encounter (INDEPENDENT_AMBULATORY_CARE_PROVIDER_SITE_OTHER): Payer: Self-pay

## 2013-10-19 ENCOUNTER — Ambulatory Visit
Admission: RE | Admit: 2013-10-19 | Discharge: 2013-10-19 | Disposition: A | Discharge status: Home / Self Care | Payer: 59 | Source: Ambulatory Visit

## 2013-10-19 DIAGNOSIS — Z1231 Encounter for screening mammogram for malignant neoplasm of breast: Secondary | ICD-10-CM

## 2014-08-20 ENCOUNTER — Emergency Department (HOSPITAL_BASED_OUTPATIENT_CLINIC_OR_DEPARTMENT_OTHER)
Admission: EM | Admit: 2014-08-20 | Discharge: 2014-08-20 | Disposition: A | Discharge status: Home / Self Care | Payer: 59 | Attending: Emergency Medicine | Admitting: Emergency Medicine

## 2014-08-20 DIAGNOSIS — Z791 Long term (current) use of non-steroidal anti-inflammatories (NSAID): Secondary | ICD-10-CM | POA: Diagnosis not present

## 2014-08-20 DIAGNOSIS — Z8709 Personal history of other diseases of the respiratory system: Secondary | ICD-10-CM | POA: Insufficient documentation

## 2014-08-20 DIAGNOSIS — G43801 Other migraine, not intractable, with status migrainosus: Secondary | ICD-10-CM | POA: Insufficient documentation

## 2014-08-20 DIAGNOSIS — Z793 Long term (current) use of hormonal contraceptives: Secondary | ICD-10-CM | POA: Insufficient documentation

## 2014-08-20 DIAGNOSIS — Z79899 Other long term (current) drug therapy: Secondary | ICD-10-CM | POA: Diagnosis not present

## 2014-08-20 DIAGNOSIS — Z87891 Personal history of nicotine dependence: Secondary | ICD-10-CM | POA: Insufficient documentation

## 2014-08-20 DIAGNOSIS — G43909 Migraine, unspecified, not intractable, without status migrainosus: Secondary | ICD-10-CM | POA: Diagnosis present

## 2014-08-20 MED ORDER — SODIUM CHLORIDE 0.9 % IV SOLN
1000.0000 mL | Freq: Once | INTRAVENOUS | Status: AC
  Administered 2014-08-20: 1000 mL via INTRAVENOUS

## 2014-08-20 MED ORDER — DIPHENHYDRAMINE HCL 50 MG/ML IJ SOLN
25.0000 mg | Freq: Once | INTRAMUSCULAR | Status: AC
  Administered 2014-08-20: 25 mg via INTRAVENOUS
  Filled 2014-08-20: qty 1

## 2014-08-20 MED ORDER — SODIUM CHLORIDE 0.9 % IV SOLN
1000.0000 mL | INTRAVENOUS | Status: DC
  Administered 2014-08-20: 1000 mL via INTRAVENOUS

## 2014-08-20 MED ORDER — HYDROMORPHONE HCL 1 MG/ML IJ SOLN
0.5000 mg | Freq: Once | INTRAMUSCULAR | Status: AC
  Administered 2014-08-20: 0.5 mg via INTRAVENOUS
  Filled 2014-08-20: qty 1

## 2014-08-20 MED ORDER — ONDANSETRON HCL 4 MG/2ML IJ SOLN
4.0000 mg | Freq: Once | INTRAMUSCULAR | Status: AC
  Administered 2014-08-20: 4 mg via INTRAVENOUS
  Filled 2014-08-20: qty 2

## 2014-08-20 MED ORDER — KETOROLAC TROMETHAMINE 30 MG/ML IJ SOLN
30.0000 mg | Freq: Once | INTRAMUSCULAR | Status: AC
  Administered 2014-08-20: 30 mg via INTRAVENOUS
  Filled 2014-08-20: qty 1

## 2014-08-20 NOTE — Discharge Instructions (Signed)

## 2014-08-20 NOTE — ED Notes (Signed)
Patient c/o migraine since Thursday, took her prescribed meds for headache, but no relief

## 2014-08-20 NOTE — ED Provider Notes (Signed)
CSN: [id number]     Arrival date & time 08/20/14  6599 History   First MD Initiated Contact with Patient 08/20/14 [phone]     Chief Complaint  Patient presents with  . Migraine     (Consider location/radiation/quality/duration/timing/severity/associated sxs/prior Treatment) HPI Patient has a history of chronic migraines. Over the past several years she has been in the treatment with the headache clinic and gotten significant improvement. She continues to occasionally get severe headaches. The headache for which she is being seen in the emergency department today started 2 days ago. It started as a typical migraine with tunnel vision and diffuse pain on the right side of her head. The patient reports that she gets nausea and vomiting with her headaches. This headache she has had nausea but no vomiting to this point. She also reports sometimes with a severe headache she will get some weakness or motor dysfunction but she has none currently. She does state with this headache she has more dizziness which is somewhat atypical. He also reports that the duration of her tunnel vision is more atypical as well. This usually resolves somewhat more quickly after onset. There has been no gait incoordination. She has tried home remedies without relief.  The patient's allergies are reviewed with her. She states she has had Toradol several times in the past without adverse reaction. She reports that her reaction to ibuprofen was very distant. Patient reports significant dystonic reaction to metoclopramide. Past Medical History  Diagnosis Date  . Migraine   . Chronic sinusitis   . Joint pain    Past Surgical History  Procedure Laterality Date  . Nasal sinus surgery  2007, 2010  . Cholecystectomy  2012    lap choli  . Abdominal hysterectomy    . Mass excision  06/03/2011    Procedure: EXCISION MASS;  Surgeon: Schuyler Amor, MD;  Location: [facility];  Service: Orthopedics;  Laterality:  Right;  EXCISION MASS RIGHT MIDDLE FINGER   . Cesarean section      x 2   Family History  Problem Relation Age of Onset  . Diabetes Mother   . Diabetes Father   . Heart attack Father   . Hypertension Father   . Diabetes Maternal Grandfather   . Heart attack Maternal Grandfather   . Hypertension Maternal Grandfather   . Diabetes Paternal Grandfather   . Heart attack Paternal Grandfather   . Hypertension Paternal Grandfather    History  Substance Use Topics  . Smoking status: Former Smoker    Quit date: 05/30/1989  . Smokeless tobacco: Never Used  . Alcohol Use: No   OB History    No data available     Review of Systems 10 Systems reviewed and are negative for acute change except as noted in the HPI.    Allergies  Aspirin; Ibuprofen; Metoclopramide hcl; Sulfa antibiotics; Triptans; and Zithromax  Home Medications   Prior to Admission medications   Medication Sig Start Date End Date Taking? Authorizing Provider  ZONISAMIDE PO Take by mouth.   Yes Historical Provider, MD  baclofen ([name]) 10 MG tablet Take 10 mg by mouth as needed.    Historical Provider, MD  calcium carbonate 200 MG capsule Take 250 mg by mouth daily.      Historical Provider, MD  diclofenac (VOLTAREN) 75 MG EC tablet Take 75 mg by mouth 2 (two) times daily.    Historical Provider, MD  estrogens, conjugated, ([name]) 0.625 MG tablet Take 0.625 mg by  mouth daily.      Historical Provider, MD  famotidine (PEPCID) 10 MG tablet Take 10 mg by mouth 2 (two) times daily.    Historical Provider, MD  methylPREDNISolone (MEDROL DOSEPAK) 4 MG tablet follow package directions 02/06/12   Awilda Metro, NP  Multiple Vitamin (MULTIVITAMIN) tablet Take 1 tablet by mouth daily.      Historical Provider, MD  Omega-3 Fatty Acids (FISH OIL PO) Take 1 tablet by mouth daily.      Historical Provider, MD  propranolol (INDERAL LA) 80 MG 24 hr capsule Take 80 mg by mouth daily.      Historical Provider, MD  topiramate  ([name]) 25 MG tablet Take 75 mg by mouth 2 (two) times daily.    Historical Provider, MD  vitamin C (ASCORBIC ACID) 500 MG tablet Take 500 mg by mouth 2 (two) times daily.    Historical Provider, MD   BP 133/77 mmHg  Pulse 80  Temp(Src) 97.9 F (36.6 C) (Oral)  Resp 20  Ht 5\' 5"  (1.651 m)  Wt 180 lb (81.647 kg)  BMI 29.95 kg/m2  SpO2 100% Physical Exam  Constitutional: She is oriented to person, place, and time. She appears well-developed and well-nourished.  HENT:  Head: Normocephalic and atraumatic.  Bilateral TMs are normal. Posterior oropharynx widely patent no erythema or exudate. Dentition excellent condition. No facial swelling. Neck is supple without lymphadenopathy or meningismus.  Eyes: EOM are normal. Pupils are equal, round, and reactive to light.  Neck: Neck supple.  Cardiovascular: Normal rate, regular rhythm, normal heart sounds and intact distal pulses.   Pulmonary/Chest: Effort normal and breath sounds normal.  Abdominal: Soft. Bowel sounds are normal. She exhibits no distension. There is no tenderness.  Musculoskeletal: Normal range of motion. She exhibits no edema.  Neurological: She is alert and oriented to person, place, and time. She has normal strength. No cranial nerve deficit. She exhibits normal muscle tone. Coordination normal. GCS eye subscore is 4. GCS verbal subscore is 5. GCS motor subscore is 6.  Normal finger-nose examination. Normal motor function upper and lower extremity. No sensory deficit to light touch.  Skin: Skin is warm, dry and intact.  Psychiatric: She has a normal mood and affect.    ED Course  Procedures (including critical care time) Labs Review Labs Reviewed - No data to display  Imaging Review No results found.   EKG Interpretation None     Recheck 11:58, headache is much improved. MDM   Final diagnoses:  Other migraine with status migrainosus, not intractable  patient has a history of migraine headaches. Symptoms are  typical with "tunnel vision". She has nausea but no vomiting and has a history of associated neurologic symptoms but none today. She has not shown any signs of infectious etiology or alternative etiology. The patient ultimately got good pain relief with hydration, Toradol, Benadryl and Dilaudid. The patient described significant dystonic reaction with Reglan in the past bus phenothiazines were not implemented.    Charlesetta Shanks, MD 08/20/14 1200

## 2014-08-21 ENCOUNTER — Emergency Department (HOSPITAL_BASED_OUTPATIENT_CLINIC_OR_DEPARTMENT_OTHER)
Admission: EM | Admit: 2014-08-21 | Discharge: 2014-08-21 | Disposition: A | Discharge status: Home / Self Care | Payer: 59 | Attending: Emergency Medicine | Admitting: Emergency Medicine

## 2014-08-21 ENCOUNTER — Encounter (HOSPITAL_BASED_OUTPATIENT_CLINIC_OR_DEPARTMENT_OTHER): Payer: Self-pay

## 2014-08-21 DIAGNOSIS — Z79899 Other long term (current) drug therapy: Secondary | ICD-10-CM | POA: Insufficient documentation

## 2014-08-21 DIAGNOSIS — Z791 Long term (current) use of non-steroidal anti-inflammatories (NSAID): Secondary | ICD-10-CM | POA: Insufficient documentation

## 2014-08-21 DIAGNOSIS — Z87891 Personal history of nicotine dependence: Secondary | ICD-10-CM | POA: Diagnosis not present

## 2014-08-21 DIAGNOSIS — Z793 Long term (current) use of hormonal contraceptives: Secondary | ICD-10-CM | POA: Diagnosis not present

## 2014-08-21 DIAGNOSIS — G43909 Migraine, unspecified, not intractable, without status migrainosus: Secondary | ICD-10-CM | POA: Insufficient documentation

## 2014-08-21 DIAGNOSIS — Z8709 Personal history of other diseases of the respiratory system: Secondary | ICD-10-CM | POA: Diagnosis not present

## 2014-08-21 LAB — I-STAT CG4 LACTIC ACID, ED: Lactic Acid, Venous: 0.83 mmol/L (ref 0.5–2.0)

## 2014-08-21 MED ORDER — BUTALBITAL-APAP-CAFFEINE 50-325-40 MG PO TABS: 1.0000 | ORAL_TABLET | Freq: Four times a day (QID) | ORAL | Status: DC | PRN

## 2014-08-21 MED ORDER — DIPHENHYDRAMINE HCL 50 MG/ML IJ SOLN
25.0000 mg | Freq: Once | INTRAMUSCULAR | Status: AC
  Administered 2014-08-21: 25 mg via INTRAVENOUS
  Filled 2014-08-21: qty 1

## 2014-08-21 MED ORDER — SODIUM CHLORIDE 0.9 % IV BOLUS (SEPSIS)
1000.0000 mL | Freq: Once | INTRAVENOUS | Status: AC
  Administered 2014-08-21: 1000 mL via INTRAVENOUS

## 2014-08-21 MED ORDER — MAGNESIUM SULFATE 2 GM/50ML IV SOLN
2.0000 g | Freq: Once | INTRAVENOUS | Status: AC
  Administered 2014-08-21: 2 g via INTRAVENOUS
  Filled 2014-08-21: qty 50

## 2014-08-21 MED ORDER — DIAZEPAM 5 MG/ML IJ SOLN
5.0000 mg | Freq: Once | INTRAMUSCULAR | Status: AC
  Administered 2014-08-21: 5 mg via INTRAVENOUS
  Filled 2014-08-21: qty 2

## 2014-08-21 MED ORDER — METHYLPREDNISOLONE SODIUM SUCC 125 MG IJ SOLR
125.0000 mg | Freq: Once | INTRAMUSCULAR | Status: AC
  Administered 2014-08-21: 125 mg via INTRAVENOUS
  Filled 2014-08-21: qty 2

## 2014-08-21 NOTE — ED Notes (Signed)
Returns with Migrane HA

## 2014-08-21 NOTE — Discharge Instructions (Signed)
Please follow with your primary care doctor in the next 2 days for a check-up. They must obtain records for further management.   Do not hesitate to return to the Emergency Department for any new, worsening or concerning symptoms.    Migraine Headache A migraine headache is an intense, throbbing pain on one or both sides of your head. A migraine can last for 30 minutes to several hours. CAUSES  The exact cause of a migraine headache is not always known. However, a migraine may be caused when nerves in the brain become irritated and release chemicals that cause inflammation. This causes pain. Certain things may also trigger migraines, such as:  Alcohol.  Smoking.  Stress.  Menstruation.  Aged cheeses.  Foods or drinks that contain nitrates, glutamate, aspartame, or tyramine.  Lack of sleep.  Chocolate.  Caffeine.  Hunger.  Physical exertion.  Fatigue.  Medicines used to treat chest pain (nitroglycerine), birth control pills, estrogen, and some blood pressure medicines. SIGNS AND SYMPTOMS  Pain on one or both sides of your head.  Pulsating or throbbing pain.  Severe pain that prevents daily activities.  Pain that is aggravated by any physical activity.  Nausea, vomiting, or both.  Dizziness.  Pain with exposure to bright lights, loud noises, or activity.  General sensitivity to bright lights, loud noises, or smells. Before you get a migraine, you may get warning signs that a migraine is coming (aura). An aura may include:  Seeing flashing lights.  Seeing bright spots, halos, or zigzag lines.  Having tunnel vision or blurred vision.  Having feelings of numbness or tingling.  Having trouble talking.  Having muscle weakness. DIAGNOSIS  A migraine headache is often diagnosed based on:  Symptoms.  Physical exam.  A CT scan or MRI of your head. These imaging tests cannot diagnose migraines, but they can help rule out other causes of  headaches. TREATMENT Medicines may be given for pain and nausea. Medicines can also be given to help prevent recurrent migraines.  HOME CARE INSTRUCTIONS  Only take over-the-counter or prescription medicines for pain or discomfort as directed by your health care provider. The use of long-term narcotics is not recommended.  Lie down in a dark, quiet room when you have a migraine.  Keep a journal to find out what may trigger your migraine headaches. For example, write down:  What you eat and drink.  How much sleep you get.  Any change to your diet or medicines.  Limit alcohol consumption.  Quit smoking if you smoke.  Get 7-9 hours of sleep, or as recommended by your health care provider.  Limit stress.  Keep lights dim if bright lights bother you and make your migraines worse. SEEK IMMEDIATE MEDICAL CARE IF:   Your migraine becomes severe.  You have a fever.  You have a stiff neck.  You have vision loss.  You have muscular weakness or loss of muscle control.  You start losing your balance or have trouble walking.  You feel faint or pass out.  You have severe symptoms that are different from your first symptoms. MAKE SURE YOU:   Understand these instructions.  Will watch your condition.  Will get help right away if you are not doing well or get worse. Document Released: 02/11/2005 Document Revised: 06/28/2013 Document Reviewed: 10/19/2012 [facility] Patient Information 2015 [place], [place]. This information is not intended to replace advice given to you by your health care provider. Make sure you discuss any questions you have with your  health care provider. ° °

## 2014-08-21 NOTE — ED Notes (Signed)
FAST Assessment: Face WNL, Arms: WNL, Speech: WNL

## 2014-08-21 NOTE — ED Provider Notes (Signed)
CSN: [id number]     Arrival date & time 08/21/14  1049 History   First MD Initiated Contact with Patient 08/21/14 1156     Chief Complaint  Patient presents with  . Migraine     (Consider location/radiation/quality/duration/timing/severity/associated sxs/prior Treatment) HPI  Blood pressure 106/64, pulse 63, temperature 98.1 F (36.7 C), temperature source Oral, resp. rate 16, weight 180 lb (81.647 kg), SpO2 97 %.  Jody Blackwell is a 49 y.o. female complaining of migraine x 4 days.  She describes it as her typical migraine that starts with tunnel vision in the L eye and pain on the R side of the back of her head, that becomes a generalized throbbing headache with pressure behind her eyes.  She rates the pain as 9/10 and has nausea, photophobia, phonophobia, and tinnitus, no vomiting.  She has tried baclofen, tylenol, and zonisamide at home with little relief.  She was seen here yesterday for her migraine and got good relief with fluids, toradol, benadryl, and dilaudid, she was able to go home and sleep, but woke up today and her migraine was back.  She see's neurology and reports that her zecuity patch (sumatriptan) she used for her migraines was recently discontinued and she plans to get in touch with her neurologist this week to discuss alternatives.  She is not having any difficulty with walking, swallowing, or changes in her speech.     Past Medical History  Diagnosis Date  . Migraine   . Chronic sinusitis   . Joint pain    Past Surgical History  Procedure Laterality Date  . Nasal sinus surgery  2007, 2010  . Cholecystectomy  2012    lap choli  . Abdominal hysterectomy    . Mass excision  06/03/2011    Procedure: EXCISION MASS;  Surgeon: Schuyler Amor, MD;  Location: [facility];  Service: Orthopedics;  Laterality: Right;  EXCISION MASS RIGHT MIDDLE FINGER   . Cesarean section      x 2   Family History  Problem Relation Age of Onset  . Diabetes Mother   .  Diabetes Father   . Heart attack Father   . Hypertension Father   . Diabetes Maternal Grandfather   . Heart attack Maternal Grandfather   . Hypertension Maternal Grandfather   . Diabetes Paternal Grandfather   . Heart attack Paternal Grandfather   . Hypertension Paternal Grandfather    History  Substance Use Topics  . Smoking status: Former Smoker    Quit date: 05/30/1989  . Smokeless tobacco: Never Used  . Alcohol Use: No   OB History    No data available     Review of Systems  10 systems reviewed and found to be negative, except as noted in the HPI.  Allergies  Aspirin; Ibuprofen; Metoclopramide hcl; Sulfa antibiotics; Triptans; and Zithromax  Home Medications   Prior to Admission medications   Medication Sig Start Date End Date Taking? Authorizing Provider  baclofen ([name]) 10 MG tablet Take 10 mg by mouth as needed.    Historical Provider, MD  calcium carbonate 200 MG capsule Take 250 mg by mouth daily.      Historical Provider, MD  diclofenac (VOLTAREN) 75 MG EC tablet Take 75 mg by mouth 2 (two) times daily.    Historical Provider, MD  estrogens, conjugated, ([name]) 0.625 MG tablet Take 0.625 mg by mouth daily.      Historical Provider, MD  famotidine (PEPCID) 10 MG tablet Take 10 mg  by mouth 2 (two) times daily.    Historical Provider, MD  methylPREDNISolone (MEDROL DOSEPAK) 4 MG tablet follow package directions 02/06/12   Awilda Metro, NP  Multiple Vitamin (MULTIVITAMIN) tablet Take 1 tablet by mouth daily.      Historical Provider, MD  Omega-3 Fatty Acids (FISH OIL PO) Take 1 tablet by mouth daily.      Historical Provider, MD  propranolol (INDERAL LA) 80 MG 24 hr capsule Take 80 mg by mouth daily.      Historical Provider, MD  topiramate ([name]) 25 MG tablet Take 75 mg by mouth 2 (two) times daily.    Historical Provider, MD  vitamin C (ASCORBIC ACID) 500 MG tablet Take 500 mg by mouth 2 (two) times daily.    Historical Provider, MD  ZONISAMIDE PO Take  by mouth.    Historical Provider, MD   BP 103/58 mmHg  Pulse 74  Temp(Src) 98.1 F (36.7 C) (Oral)  Resp 18  Wt 180 lb (81.647 kg)  SpO2 98% Physical Exam  Constitutional: She is oriented to person, place, and time. She appears well-developed and well-nourished.  HENT:  Head: Normocephalic and atraumatic.  Mouth/Throat: Oropharynx is clear and moist.  Eyes: Conjunctivae and EOM are normal. Pupils are equal, round, and reactive to light.  No TTP of maxillary or frontal sinuses  No TTP or induration of temporal arteries bilaterally  Neck: Normal range of motion. Neck supple.  FROM to C-spine. Pt can touch chin to chest without discomfort. No TTP of midline cervical spine.   Cardiovascular: Normal rate, regular rhythm and intact distal pulses.   Pulmonary/Chest: Effort normal and breath sounds normal. No respiratory distress. She has no wheezes. She has no rales. She exhibits no tenderness.  Abdominal: Soft. Bowel sounds are normal. There is no tenderness.  Musculoskeletal: Normal range of motion. She exhibits no edema or tenderness.  Neurological: She is alert and oriented to person, place, and time. No cranial nerve deficit.  II-Visual fields grossly intact. III/IV/VI-Extraocular movements intact.  Pupils reactive bilaterally. V/VII-Smile symmetric, equal eyebrow raise,  facial sensation intact VIII- Hearing grossly intact IX/X-Normal gag XI-bilateral shoulder shrug XII-midline tongue extension Motor: 5/5 bilaterally with normal tone and bulk Cerebellar: Normal finger-to-nose  and normal heel-to-shin test.   Romberg negative Ambulates with a coordinated gait   Nursing note and vitals reviewed.   ED Course  Procedures (including critical care time) Labs Review Labs Reviewed - No data to display  Imaging Review No results found.   EKG Interpretation None      MDM   Final diagnoses:  Migraine without status migrainosus, not intractable, unspecified migraine type     Filed Vitals:   08/21/14 1059 08/21/14 1120 08/21/14 1318 08/21/14 1408  BP: 110/61 103/58 109/65 106/64  Pulse: 91 74 80 63  Temp: 98.1 F (36.7 C)     TempSrc: Oral     Resp: 18  18 16   Weight: 180 lb (81.647 kg)     SpO2: 99% 98% 100% 97%    Medications  sodium chloride 0.9 % bolus 1,000 mL (1,000 mLs Intravenous New Bag/Given 08/21/14 1242)  diphenhydrAMINE (BENADRYL) injection 25 mg (25 mg Intravenous Given 08/21/14 1253)  methylPREDNISolone sodium succinate (SOLU-MEDROL) 125 mg/2 mL injection 125 mg (125 mg Intravenous Given 08/21/14 1254)  magnesium sulfate IVPB 2 g 50 mL (0 g Intravenous Stopped 08/21/14 1408)  diazepam (VALIUM) injection 5 mg (5 mg Intravenous Given 08/21/14 1255)    Jody Blackwell is a  pleasant 49 y.o. female presenting with HA. Presentation is like pts typical HA and non concerning for [facility], ICH, Meningitis, or temporal arteritis. Pt is afebrile with no focal neuro deficits, nuchal rigidity. Pt is to follow up with PCP to discuss prophylactic medication. Pt verbalizes understanding and is agreeable with plan to dc.  Evaluation does not show pathology that would require ongoing emergent intervention or inpatient treatment. Pt is hemodynamically stable and mentating appropriately. Discussed findings and plan with patient/guardian, who agrees with care plan. All questions answered. Return precautions discussed and outpatient follow up given.   New Prescriptions   BUTALBITAL-ACETAMINOPHEN-CAFFEINE (FIORICET) 50-325-40 MG PER TABLET    Take 1 tablet by mouth every 6 (six) hours as needed for headache.         Monico Blitz, PA-C 08/21/14 1454  Leonard Schwartz, MD 08/21/14 [phone]

## 2014-08-21 NOTE — ED Notes (Signed)
MD at bedside. 

## 2014-08-21 NOTE — ED Notes (Signed)
Patient seen here yesterday for migraine and treated for same and now headache has returned with nausea

## 2014-08-21 NOTE — ED Notes (Signed)
Having sensitivity to light, has some nausea, denies vomiting.

## 2014-08-21 NOTE — ED Notes (Signed)
Vital signs delayed because provider is in the room with patient.

## 2014-09-30 ENCOUNTER — Other Ambulatory Visit: Payer: Self-pay

## 2014-09-30 DIAGNOSIS — Z1231 Encounter for screening mammogram for malignant neoplasm of breast: Secondary | ICD-10-CM

## 2014-10-26 ENCOUNTER — Ambulatory Visit: Payer: 59

## 2014-10-31 ENCOUNTER — Emergency Department (HOSPITAL_BASED_OUTPATIENT_CLINIC_OR_DEPARTMENT_OTHER)
Admission: EM | Admit: 2014-10-31 | Discharge: 2014-10-31 | Disposition: A | Discharge status: Home / Self Care | Payer: 59 | Attending: Emergency Medicine | Admitting: Emergency Medicine

## 2014-10-31 ENCOUNTER — Encounter (HOSPITAL_BASED_OUTPATIENT_CLINIC_OR_DEPARTMENT_OTHER): Payer: Self-pay

## 2014-10-31 DIAGNOSIS — Z8709 Personal history of other diseases of the respiratory system: Secondary | ICD-10-CM | POA: Diagnosis not present

## 2014-10-31 DIAGNOSIS — Z8739 Personal history of other diseases of the musculoskeletal system and connective tissue: Secondary | ICD-10-CM | POA: Diagnosis not present

## 2014-10-31 DIAGNOSIS — Z79899 Other long term (current) drug therapy: Secondary | ICD-10-CM | POA: Diagnosis not present

## 2014-10-31 DIAGNOSIS — Z87891 Personal history of nicotine dependence: Secondary | ICD-10-CM | POA: Diagnosis not present

## 2014-10-31 DIAGNOSIS — G43111 Migraine with aura, intractable, with status migrainosus: Secondary | ICD-10-CM | POA: Diagnosis not present

## 2014-10-31 DIAGNOSIS — G43909 Migraine, unspecified, not intractable, without status migrainosus: Secondary | ICD-10-CM | POA: Diagnosis present

## 2014-10-31 LAB — URINALYSIS, ROUTINE W REFLEX MICROSCOPIC
BILIRUBIN URINE: NEGATIVE
Glucose, UA: NEGATIVE mg/dL
Hgb urine dipstick: NEGATIVE
KETONES UR: NEGATIVE mg/dL
LEUKOCYTES UA: NEGATIVE
NITRITE: NEGATIVE
PH: 7.5 (ref 5.0–8.0)
Protein, ur: NEGATIVE mg/dL
Specific Gravity, Urine: 1.01 (ref 1.005–1.030)
UROBILINOGEN UA: 0.2 mg/dL (ref 0.0–1.0)

## 2014-10-31 MED ORDER — ONDANSETRON HCL 4 MG PO TABS: 4.0000 mg | ORAL_TABLET | Freq: Three times a day (TID) | ORAL | Status: DC | PRN

## 2014-10-31 MED ORDER — DIPHENHYDRAMINE HCL 50 MG/ML IJ SOLN
25.0000 mg | Freq: Once | INTRAMUSCULAR | Status: AC
  Administered 2014-10-31: 25 mg via INTRAVENOUS
  Filled 2014-10-31: qty 1

## 2014-10-31 MED ORDER — SODIUM CHLORIDE 0.9 % IV BOLUS (SEPSIS)
1000.0000 mL | Freq: Once | INTRAVENOUS | Status: AC
  Administered 2014-10-31: 1000 mL via INTRAVENOUS

## 2014-10-31 MED ORDER — KETOROLAC TROMETHAMINE 30 MG/ML IJ SOLN
30.0000 mg | Freq: Once | INTRAMUSCULAR | Status: AC
  Administered 2014-10-31: 30 mg via INTRAVENOUS
  Filled 2014-10-31: qty 1

## 2014-10-31 MED ORDER — ONDANSETRON HCL 4 MG/2ML IJ SOLN
4.0000 mg | Freq: Once | INTRAMUSCULAR | Status: AC
  Administered 2014-10-31: 4 mg via INTRAVENOUS
  Filled 2014-10-31: qty 2

## 2014-10-31 MED ORDER — METHYLPREDNISOLONE SODIUM SUCC 125 MG IJ SOLR
125.0000 mg | Freq: Once | INTRAMUSCULAR | Status: AC
  Administered 2014-10-31: 125 mg via INTRAVENOUS
  Filled 2014-10-31: qty 2

## 2014-10-31 NOTE — Discharge Instructions (Signed)

## 2014-10-31 NOTE — ED Provider Notes (Signed)
CSN: [id number]     Arrival date & time 10/31/14  1159 History   First MD Initiated Contact with Patient 10/31/14 1238     Chief Complaint  Patient presents with  . Migraine    HPI  49 year old lady history of chronic migraines presents with headache, photophobia, phonophobia, nausea, vomiting, chills, and right sided flank pain. She said this feels like a typical migraine for her except for the back pain and chills. She denied any dysuria, neck stiffness, focal weakness, paresthesias.  Past Medical History  Diagnosis Date  . Migraine   . Chronic sinusitis   . Joint pain    Past Surgical History  Procedure Laterality Date  . Nasal sinus surgery  2007, 2010  . Cholecystectomy  2012    lap choli  . Abdominal hysterectomy    . Mass excision  06/03/2011    Procedure: EXCISION MASS;  Surgeon: Schuyler Amor, MD;  Location: [facility];  Service: Orthopedics;  Laterality: Right;  EXCISION MASS RIGHT MIDDLE FINGER   . Cesarean section      x 2   Family History  Problem Relation Age of Onset  . Diabetes Mother   . Diabetes Father   . Heart attack Father   . Hypertension Father   . Diabetes Maternal Grandfather   . Heart attack Maternal Grandfather   . Hypertension Maternal Grandfather   . Diabetes Paternal Grandfather   . Heart attack Paternal Grandfather   . Hypertension Paternal Grandfather    Social History  Substance Use Topics  . Smoking status: Former Smoker    Quit date: 05/30/1989  . Smokeless tobacco: Never Used  . Alcohol Use: No   OB History    No data available     Review of Systems  Constitutional: Positive for fever, chills and appetite change. Negative for diaphoresis.  HENT: Negative for sinus pressure and sore throat.   Respiratory: Negative for chest tightness and shortness of breath.   Cardiovascular: Negative for chest pain.  Gastrointestinal: Positive for nausea and vomiting. Negative for abdominal pain and diarrhea.   Genitourinary: Positive for flank pain. Negative for dysuria, frequency, hematuria, vaginal bleeding and vaginal discharge.  Musculoskeletal: Positive for myalgias. Negative for neck pain and neck stiffness.  Skin: Negative for rash.  Neurological: Positive for headaches. Negative for dizziness, seizures and light-headedness.    Allergies  Aspirin; Ibuprofen; Metoclopramide hcl; Sulfa antibiotics; Triptans; and Zithromax  Home Medications   Prior to Admission medications   Medication Sig Start Date End Date Taking? Authorizing Provider  SUMAtriptan Succinate (ONZETRA XSAIL NA) Place into the nose.   Yes Historical Provider, MD  TIZANIDINE HCL PO Take by mouth.   Yes Historical Provider, MD  baclofen ([name]) 10 MG tablet Take 10 mg by mouth as needed.    Historical Provider, MD  butalbital-acetaminophen-caffeine (FIORICET) [phone] MG per tablet Take 1 tablet by mouth every 6 (six) hours as needed for headache. 08/21/14 08/21/15  Elmyra Ricks Pisciotta, PA-C  calcium carbonate 200 MG capsule Take 250 mg by mouth daily.      Historical Provider, MD  diclofenac (VOLTAREN) 75 MG EC tablet Take 75 mg by mouth 2 (two) times daily.    Historical Provider, MD  estrogens, conjugated, ([name]) 0.625 MG tablet Take 0.625 mg by mouth daily.      Historical Provider, MD  famotidine (PEPCID) 10 MG tablet Take 10 mg by mouth 2 (two) times daily.    Historical Provider, MD  methylPREDNISolone (MEDROL  DOSEPAK) 4 MG tablet follow package directions 02/06/12   Awilda Metro, NP  Multiple Vitamin (MULTIVITAMIN) tablet Take 1 tablet by mouth daily.      Historical Provider, MD  Omega-3 Fatty Acids (FISH OIL PO) Take 1 tablet by mouth daily.      Historical Provider, MD  propranolol (INDERAL LA) 80 MG 24 hr capsule Take 80 mg by mouth daily.      Historical Provider, MD  topiramate ([name]) 25 MG tablet Take 75 mg by mouth 2 (two) times daily.    Historical Provider, MD  vitamin C (ASCORBIC ACID) 500 MG  tablet Take 500 mg by mouth 2 (two) times daily.    Historical Provider, MD  ZONISAMIDE PO Take by mouth.    Historical Provider, MD   BP 105/57 mmHg  Pulse 105  Temp(Src) 99.4 F (37.4 C) (Oral)  Resp 16  Ht 5\' 5"  (1.651 m)  Wt 185 lb (83.915 kg)  BMI 30.79 kg/m2  SpO2 98% Physical Exam  Constitutional: She is oriented to person, place, and time. She appears well-developed and well-nourished.  HENT:  Head: Normocephalic and atraumatic.  Eyes: Conjunctivae and EOM are normal. Pupils are equal, round, and reactive to light.  Neck: Normal range of motion. Neck supple.  No neck stiffness or pain on flexion No back pain on hip flexion  Cardiovascular: Normal rate, regular rhythm and normal heart sounds.   Pulmonary/Chest: Effort normal and breath sounds normal.  Abdominal: Soft. Bowel sounds are normal.  Musculoskeletal: Normal range of motion.  Neurological: She is alert and oriented to person, place, and time. She has normal reflexes. No cranial nerve deficit.  Skin: Skin is warm and dry.  Psychiatric: She has a normal mood and affect. Her behavior is normal.    ED Course  Procedures (including critical care time)  Labs Review Labs Reviewed  URINALYSIS, ROUTINE W REFLEX MICROSCOPIC (NOT AT [facility])   Imaging Review No results found. I have personally reviewed and evaluated these images and lab results as part of my medical decision-making.   EKG Interpretation None      MDM   Final diagnoses:  Intractable migraine with aura with status migrainosus    49 yo F h/o chronic migraines presents with typical migraine that responded well to IV toradol, benadryl, and solumedrol. Her subjective chills were likely related and I had low suspicion for meningitis given no meningeal signs of focal signs on neurological exam. She was discharged home with Zofran for her nausea and close follow up with her neurologist.   Loleta Chance, MD 10/31/14 [place], MD 11/01/14  2131

## 2014-10-31 NOTE — ED Notes (Signed)
Resident MD at bedside.

## 2014-10-31 NOTE — ED Notes (Signed)
Delo MD at bedside.

## 2014-10-31 NOTE — ED Notes (Signed)
Migraine x all day yesterday-vomiting, chills started last night with right flank pain

## 2014-11-08 ENCOUNTER — Ambulatory Visit
Admission: RE | Admit: 2014-11-08 | Discharge: 2014-11-08 | Disposition: A | Discharge status: Home / Self Care | Payer: 59 | Source: Ambulatory Visit

## 2014-11-08 DIAGNOSIS — Z1231 Encounter for screening mammogram for malignant neoplasm of breast: Secondary | ICD-10-CM

## 2015-02-28 MED FILL — TRIAMCINOLONE 0.1% CREAM: 0.1 | 30 days supply | Qty: 80 | Fill #0

## 2015-03-02 DIAGNOSIS — G43719 Chronic migraine without aura, intractable, without status migrainosus: Secondary | ICD-10-CM | POA: Diagnosis not present

## 2015-03-02 DIAGNOSIS — G43111 Migraine with aura, intractable, with status migrainosus: Secondary | ICD-10-CM | POA: Diagnosis not present

## 2015-03-02 DIAGNOSIS — G43019 Migraine without aura, intractable, without status migrainosus: Secondary | ICD-10-CM | POA: Diagnosis not present

## 2015-03-03 MED FILL — ZOMIG 5 MG NASAL SPRAY: 5 | 30 days supply | Qty: 6 | Fill #0

## 2015-03-16 MED FILL — ZONISAMIDE 100 MG CAPSULE: 100 | 30 days supply | Qty: 120 | Fill #0

## 2015-03-22 DIAGNOSIS — F329 Major depressive disorder, single episode, unspecified: Secondary | ICD-10-CM | POA: Diagnosis not present

## 2015-03-22 DIAGNOSIS — Z0000 Encounter for general adult medical examination without abnormal findings: Secondary | ICD-10-CM | POA: Diagnosis not present

## 2015-03-22 DIAGNOSIS — M2550 Pain in unspecified joint: Secondary | ICD-10-CM | POA: Diagnosis not present

## 2015-03-23 MED FILL — VERAPAMIL ER 120 MG CAPSULE: 120 | 30 days supply | Qty: 60 | Fill #1

## 2015-03-28 MED FILL — ZOMIG 5 MG NASAL SPRAY: 5 | 30 days supply | Qty: 6 | Fill #1

## 2015-04-05 DIAGNOSIS — Z800 Family history of malignant neoplasm of digestive organs: Secondary | ICD-10-CM | POA: Diagnosis not present

## 2015-04-05 DIAGNOSIS — Z1211 Encounter for screening for malignant neoplasm of colon: Secondary | ICD-10-CM | POA: Diagnosis not present

## 2015-04-07 MED FILL — DICLOFENAC POT 50 MG TABLET: 50 | 3 days supply | Qty: 6 | Fill #0

## 2015-04-14 MED FILL — GAVILYTE-N SOLUTION: 420 | 1 days supply | Qty: 4000 | Fill #0

## 2015-04-14 MED FILL — PERPHENAZINE 4 MG TABLET: 4 | 4 days supply | Qty: 15 | Fill #0

## 2015-04-14 MED FILL — ZONISAMIDE 100 MG CAPSULE: 100 | 30 days supply | Qty: 120 | Fill #1

## 2015-04-19 MED FILL — VERAPAMIL ER 120 MG CAPSULE: 120 | 30 days supply | Qty: 60 | Fill #2

## 2015-04-21 DIAGNOSIS — Z1211 Encounter for screening for malignant neoplasm of colon: Secondary | ICD-10-CM | POA: Diagnosis not present

## 2015-04-21 DIAGNOSIS — K5730 Diverticulosis of large intestine without perforation or abscess without bleeding: Secondary | ICD-10-CM | POA: Diagnosis not present

## 2015-04-26 DIAGNOSIS — H40013 Open angle with borderline findings, low risk, bilateral: Secondary | ICD-10-CM | POA: Diagnosis not present

## 2015-04-26 DIAGNOSIS — D3132 Benign neoplasm of left choroid: Secondary | ICD-10-CM | POA: Diagnosis not present

## 2015-05-01 DIAGNOSIS — G43019 Migraine without aura, intractable, without status migrainosus: Secondary | ICD-10-CM | POA: Diagnosis not present

## 2015-05-01 DIAGNOSIS — G43111 Migraine with aura, intractable, with status migrainosus: Secondary | ICD-10-CM | POA: Diagnosis not present

## 2015-05-01 DIAGNOSIS — G43719 Chronic migraine without aura, intractable, without status migrainosus: Secondary | ICD-10-CM | POA: Diagnosis not present

## 2015-05-02 MED FILL — tiZANidine HCL 4 MG TABS: 4 | 28 days supply | Qty: 30 | Fill #0

## 2015-05-02 MED FILL — ZOMIG 5 MG NASAL SPRAY: 5 | 30 days supply | Qty: 6 | Fill #0

## 2015-05-02 MED FILL — GABAPENTIN 300 MG CAPSULE: 300 | 30 days supply | Qty: 60 | Fill #0

## 2015-05-09 DIAGNOSIS — M79642 Pain in left hand: Secondary | ICD-10-CM | POA: Diagnosis not present

## 2015-05-09 DIAGNOSIS — M79671 Pain in right foot: Secondary | ICD-10-CM | POA: Diagnosis not present

## 2015-05-09 DIAGNOSIS — M79641 Pain in right hand: Secondary | ICD-10-CM | POA: Diagnosis not present

## 2015-05-09 DIAGNOSIS — M7071 Other bursitis of hip, right hip: Secondary | ICD-10-CM | POA: Diagnosis not present

## 2015-05-09 DIAGNOSIS — M79672 Pain in left foot: Secondary | ICD-10-CM | POA: Diagnosis not present

## 2015-05-09 DIAGNOSIS — R5381 Other malaise: Secondary | ICD-10-CM | POA: Diagnosis not present

## 2015-05-10 DIAGNOSIS — M2550 Pain in unspecified joint: Secondary | ICD-10-CM | POA: Diagnosis not present

## 2015-05-10 DIAGNOSIS — R5381 Other malaise: Secondary | ICD-10-CM | POA: Diagnosis not present

## 2015-05-10 DIAGNOSIS — Z79899 Other long term (current) drug therapy: Secondary | ICD-10-CM | POA: Diagnosis not present

## 2015-05-10 DIAGNOSIS — E559 Vitamin D deficiency, unspecified: Secondary | ICD-10-CM | POA: Diagnosis not present

## 2015-05-10 MED FILL — PREMARIN 0.625 MG TABLET: 0.625 | 90 days supply | Qty: 90 | Fill #0

## 2015-05-15 MED FILL — PANTOPRAZOLE SOD DR 40 MG T: 40 | 30 days supply | Qty: 30 | Fill #0

## 2015-05-16 DIAGNOSIS — R278 Other lack of coordination: Secondary | ICD-10-CM | POA: Diagnosis not present

## 2015-05-16 DIAGNOSIS — M6281 Muscle weakness (generalized): Secondary | ICD-10-CM | POA: Diagnosis not present

## 2015-05-16 DIAGNOSIS — M62838 Other muscle spasm: Secondary | ICD-10-CM | POA: Diagnosis not present

## 2015-05-16 DIAGNOSIS — M7060 Trochanteric bursitis, unspecified hip: Secondary | ICD-10-CM | POA: Diagnosis not present

## 2015-05-16 DIAGNOSIS — M791 Myalgia: Secondary | ICD-10-CM | POA: Diagnosis not present

## 2015-05-17 ENCOUNTER — Other Ambulatory Visit (HOSPITAL_COMMUNITY): Payer: Self-pay | Admitting: Orthopaedic Surgery

## 2015-05-17 DIAGNOSIS — M6281 Muscle weakness (generalized): Secondary | ICD-10-CM | POA: Diagnosis not present

## 2015-05-17 DIAGNOSIS — M62838 Other muscle spasm: Secondary | ICD-10-CM | POA: Diagnosis not present

## 2015-05-17 DIAGNOSIS — R278 Other lack of coordination: Secondary | ICD-10-CM | POA: Diagnosis not present

## 2015-05-17 DIAGNOSIS — M25571 Pain in right ankle and joints of right foot: Secondary | ICD-10-CM

## 2015-05-17 DIAGNOSIS — M791 Myalgia: Secondary | ICD-10-CM | POA: Diagnosis not present

## 2015-05-17 DIAGNOSIS — M7060 Trochanteric bursitis, unspecified hip: Secondary | ICD-10-CM | POA: Diagnosis not present

## 2015-05-23 DIAGNOSIS — M7060 Trochanteric bursitis, unspecified hip: Secondary | ICD-10-CM | POA: Diagnosis not present

## 2015-05-23 DIAGNOSIS — M6281 Muscle weakness (generalized): Secondary | ICD-10-CM | POA: Diagnosis not present

## 2015-05-23 DIAGNOSIS — M62838 Other muscle spasm: Secondary | ICD-10-CM | POA: Diagnosis not present

## 2015-05-23 DIAGNOSIS — R278 Other lack of coordination: Secondary | ICD-10-CM | POA: Diagnosis not present

## 2015-05-24 MED FILL — VERAPAMIL ER 120 MG CAPSULE: 120 | 30 days supply | Qty: 60 | Fill #3

## 2015-05-25 ENCOUNTER — Ambulatory Visit (HOSPITAL_COMMUNITY): Admission: RE | Admit: 2015-05-25 | Payer: 59 | Source: Ambulatory Visit

## 2015-05-25 MED FILL — levETIRAcetam 250 MG TABS: 250 | 30 days supply | Qty: 60 | Fill #0

## 2015-06-07 ENCOUNTER — Other Ambulatory Visit: Payer: Self-pay | Admitting: Orthopaedic Surgery

## 2015-06-07 DIAGNOSIS — M79642 Pain in left hand: Secondary | ICD-10-CM | POA: Diagnosis not present

## 2015-06-07 DIAGNOSIS — M2550 Pain in unspecified joint: Secondary | ICD-10-CM | POA: Diagnosis not present

## 2015-06-07 DIAGNOSIS — M79641 Pain in right hand: Secondary | ICD-10-CM | POA: Diagnosis not present

## 2015-06-07 DIAGNOSIS — M25571 Pain in right ankle and joints of right foot: Secondary | ICD-10-CM

## 2015-06-07 MED FILL — VIT D3-50 50,000 UNITS CAPS: 1.25 MG | 84 days supply | Qty: 24 | Fill #0

## 2015-06-15 MED FILL — PANTOPRAZOLE SOD DR 40 MG T: 40 | 30 days supply | Qty: 30 | Fill #1

## 2015-06-16 DIAGNOSIS — M19071 Primary osteoarthritis, right ankle and foot: Secondary | ICD-10-CM | POA: Diagnosis not present

## 2015-06-16 DIAGNOSIS — M7071 Other bursitis of hip, right hip: Secondary | ICD-10-CM | POA: Diagnosis not present

## 2015-06-16 DIAGNOSIS — M25562 Pain in left knee: Secondary | ICD-10-CM | POA: Diagnosis not present

## 2015-06-16 DIAGNOSIS — R5381 Other malaise: Secondary | ICD-10-CM | POA: Diagnosis not present

## 2015-06-16 DIAGNOSIS — M25561 Pain in right knee: Secondary | ICD-10-CM | POA: Diagnosis not present

## 2015-06-16 MED FILL — DICLOFENAC SODIUM 1% GEL: 1 | 30 days supply | Qty: 300 | Fill #0

## 2015-06-19 ENCOUNTER — Ambulatory Visit (INDEPENDENT_AMBULATORY_CARE_PROVIDER_SITE_OTHER): Payer: 59

## 2015-06-19 DIAGNOSIS — M7751 Other enthesopathy of right foot: Secondary | ICD-10-CM | POA: Diagnosis not present

## 2015-06-19 DIAGNOSIS — M25571 Pain in right ankle and joints of right foot: Secondary | ICD-10-CM | POA: Diagnosis not present

## 2015-06-22 MED FILL — VERAPAMIL ER 120 MG CAPSULE: 120 | 30 days supply | Qty: 60 | Fill #4

## 2015-06-22 MED FILL — levETIRAcetam 250 MG TABS: 250 | 30 days supply | Qty: 60 | Fill #0

## 2015-06-26 DIAGNOSIS — S86311A Strain of muscle(s) and tendon(s) of peroneal muscle group at lower leg level, right leg, initial encounter: Secondary | ICD-10-CM | POA: Diagnosis not present

## 2015-06-26 DIAGNOSIS — G43719 Chronic migraine without aura, intractable, without status migrainosus: Secondary | ICD-10-CM | POA: Diagnosis not present

## 2015-06-26 DIAGNOSIS — G43019 Migraine without aura, intractable, without status migrainosus: Secondary | ICD-10-CM | POA: Diagnosis not present

## 2015-06-26 DIAGNOSIS — G43111 Migraine with aura, intractable, with status migrainosus: Secondary | ICD-10-CM | POA: Diagnosis not present

## 2015-06-27 MED FILL — tiZANidine HCL 4 MG TABS: 4 | 30 days supply | Qty: 30 | Fill #0

## 2015-06-30 NOTE — H&P (Addendum)
Jody Fears, MD   Biagio Borg, PA-C [address] [place], [place]  [address]                             [phone]   ORTHOPAEDIC HISTORY & PHYSICAL  Jody Blackwell MRN:  [id number] DOB/SEX:  [birth year]female  CHIEF COMPLAINT:  Painful right ankle  HISTORY: Jody Blackwell is 50 years old and ha been seen  on several occasions in regard to a problem she is having with her right foot and ankle.  She sustained an inversion stress to her ankle in October of 2016, has had recurrent problems ever since that point.  She also has been followed by Dr. Estanislado Pandy with history of arthritis and multiple joint pain as well as fatigue.  She was concerned that she might have a myofascial pain syndrome and had chronic insomnia and fatigue and has been on tizanidine.  She has no clinical features of autoimmune as her autoimmune workup was negative.  She does have some osteoarthritis in her hands and feet which cause some stiffness, but she continues to have a problem with the lateral aspect of her right ankle.  She has worn a boot.  She has used the ankle support.  She has recurrent episodes of pain along the lateral aspect of her right ankle as well as swelling depending upon her activity.  She has not really had any numbness or tingling.  She has had several x-rays which were negative so we did order a MRI scan of her ankle.   MRI scan is reviewed with her and the only pathology was a short segment longitudinal split tear of the peroneus brevis just distal to the lateral malleolus associated with mild tendinosis.  The peroneus longus tendon was intact.  All of the other tendons along the posterior, anterior and medial aspect of her ankle were intact.  The plantar fascia was intact.  There was no evidence of any osteoarthritis or chondromalacia.  Deltoid and spring ligaments were intact.  There was no ankle joint effusion and specifically no evidence of abnormality of the anterior talofibular or the  fibulocalcaneal ligaments  PAST MEDICAL HISTORY: Patient Active Problem List   Diagnosis Date Noted  . Left ankle pain 08/03/2010  . Left shoulder pain 06/29/2010   Past Medical History  Diagnosis Date  . Migraine   . Chronic sinusitis   . Joint pain   . Depression   . GERD (gastroesophageal reflux disease)   . Arthritis     OA in hands, hip and feet  . Complication of anesthesia   . PONV (postoperative nausea and vomiting)    Past Surgical History  Procedure Laterality Date  . Nasal sinus surgery  2007, 2010  . Cholecystectomy  2012    lap choli  . Abdominal hysterectomy    . Mass excision  06/03/2011    Procedure: EXCISION MASS;  Surgeon: Schuyler Amor, MD;  Location: [facility];  Service: Orthopedics;  Laterality: Right;  EXCISION MASS RIGHT MIDDLE FINGER   . Cesarean section      x 2     MEDICATIONS:   Prescriptions prior to admission  Medication Sig Dispense Refill Last Dose  . estrogens, conjugated, ([name]) 0.625 MG tablet Take 0.625 mg by mouth daily.     07/19/2015 at Unknown time  . levETIRAcetam (KEPPRA) 500 MG tablet Take 500 mg by mouth 2 (two) times daily.  07/19/2015 at Unknown time  . pantoprazole (PROTONIX) 40 MG tablet Take 40 mg by mouth daily.   07/20/2015 at [facility]  . verapamil (CALAN-SR) 120 MG CR tablet Take 120 mg by mouth 2 (two) times daily.   07/20/2015 at [facility]  . calcium carbonate 200 MG capsule Take 250 mg by mouth daily.     More than a month at Unknown time  . ondansetron (ZOFRAN) 4 MG tablet Take 1 tablet (4 mg total) by mouth every 8 (eight) hours as needed for nausea or vomiting. 20 tablet 0 More than a month at Unknown time  . TIZANIDINE HCL PO Take by mouth.   More than a month at Unknown time  . zolmitriptan ([name]) 5 MG nasal solution Place into the nose as needed for migraine.   More than a month at Unknown time    ALLERGIES:   Allergies  Allergen Reactions  . Aspirin Hives  . Ibuprofen Hives  . Metoclopramide  Hcl     States she had Parkinson's like syndrome  . Sulfa Antibiotics   . Triptans Other (See Comments)    Serotonin syndrome   . Zithromax [Azithromycin Dihydrate]     REVIEW OF SYSTEMS:  A comprehensive review of systems was negative except for: Cardiovascular: positive for palpitations Neurological: positive for headaches Behavioral/Psych: positive for depression   FAMILY HISTORY:   Family History  Problem Relation Age of Onset  . Diabetes Mother   . Diabetes Father   . Heart attack Father   . Hypertension Father   . Diabetes Maternal Grandfather   . Heart attack Maternal Grandfather   . Hypertension Maternal Grandfather   . Diabetes Paternal Grandfather   . Heart attack Paternal Grandfather   . Hypertension Paternal Grandfather     SOCIAL HISTORY:   Social History  Substance Use Topics  . Smoking status: Former Smoker    Quit date: 05/30/1989  . Smokeless tobacco: Never Used  . Alcohol Use: No      EXAMINATION: Vital signs in last 24 hours: Temp:  [97.9 F (36.6 C)] 97.9 F (36.6 C) (05/25 0623) Pulse Rate:  [77] 77 (05/25 0623) Resp:  [20] 20 (05/25 0623) BP: (124)/(58) 124/58 mmHg (05/25 0623) SpO2:  [99 %] 99 % (05/25 0623) Weight:  [92.08 kg (203 lb)] 92.08 kg (203 lb) (05/25 0623)  Head is normocephalic.   Eyes:  Pupils equal, round and reactive to light and accommodation.  Extraocular intact. ENT: Ears, nose, and throat were benign.   Neck: supple, no bruits were noted.   Chest: good expansion.   Lungs: essentially clear.   Cardiac: regular rhythm and rate, normal S1, S2.  No murmurs appreciated. Pulses :  1+ bilateral and symmetric in lower extremities. Abdomen is scaphoid, soft, nontender, no masses palpable, normal bowel sounds  present. CNS:  He is oriented x3 and cranial nerves II-XII grossly intact. Breast, rectal, and genital exams: not performed and not indicated for an orthopedic evaluation. Musculoskeletal: No evidence of ecchymosis or  erythema, neurovascular exam was intact.  She has some discomfort over the anterior talofibular and the fibulocalcaneal ligaments, but more tenderness over the peroneal tendons particularly distal to the lateral malleolus.  The skin was intact.  I could not tell if she had an increased talar tilt on the right compared to the left, but a negative anterior drawer sign.  No pain posteriorly along the Achilles and no pain medially.     Imaging Review MRI scan is reviewed with her  and the only pathology was a short segment longitudinal split tear of the peroneus brevis just distal to the lateral malleolus associated with mild tendinosis.  The peroneus longus tendon was intact.  All of the other tendons along the posterior, anterior and medial aspect of her ankle were intact.  The plantar fascia was intact.  There was no evidence of any osteoarthritis or chondromalacia.  Deltoid and spring ligaments were intact.  There was no ankle joint effusion and specifically no evidence of abnormality of the anterior talofibular or the fibulocalcaneal ligaments.  Stress views of her ankle without any talar tilt.  ASSESSMENT: right tear of the peroneus brevis tendon distal to the medial and lateral malleolus  Past Medical History  Diagnosis Date  . Migraine   . Chronic sinusitis   . Joint pain   . Depression   . GERD (gastroesophageal reflux disease)   . Arthritis     OA in hands, hip and feet  . Complication of anesthesia   . PONV (postoperative nausea and vomiting)     PLAN: Plan for right exploration of the peroneal tendon and then repairing the split tear then do a synovectomy.  The procedure,  risks, and benefits of surgery were presented and reviewed. The risks including but not limited to infection, blood clots, vascular and nerve injury, stiffness,  among others were discussed. The patient acknowledged the explanation, agreed to proceed.   Mike Craze [place], [place] [phone]  07/20/2015 7:30 AM

## 2015-07-17 ENCOUNTER — Encounter (HOSPITAL_BASED_OUTPATIENT_CLINIC_OR_DEPARTMENT_OTHER): Payer: Self-pay | Admitting: *Deleted

## 2015-07-19 MED FILL — VERAPAMIL ER 120 MG CAPSULE: 120 | 30 days supply | Qty: 60 | Fill #5

## 2015-07-19 MED FILL — levETIRAcetam 500 MG TABS: 500 | 30 days supply | Qty: 30 | Fill #0

## 2015-07-19 MED FILL — PANTOPRAZOLE SOD DR 40 MG T: 40 | 90 days supply | Qty: 90 | Fill #2

## 2015-07-20 ENCOUNTER — Ambulatory Visit (HOSPITAL_BASED_OUTPATIENT_CLINIC_OR_DEPARTMENT_OTHER): Payer: 59 | Admitting: Certified Registered"

## 2015-07-20 ENCOUNTER — Encounter (HOSPITAL_BASED_OUTPATIENT_CLINIC_OR_DEPARTMENT_OTHER): Payer: Self-pay | Admitting: Certified Registered"

## 2015-07-20 ENCOUNTER — Encounter (HOSPITAL_BASED_OUTPATIENT_CLINIC_OR_DEPARTMENT_OTHER)
Admission: RE | Disposition: A | Discharge status: Home / Self Care | Payer: Self-pay | Source: Ambulatory Visit | Attending: Orthopaedic Surgery

## 2015-07-20 ENCOUNTER — Ambulatory Visit (HOSPITAL_BASED_OUTPATIENT_CLINIC_OR_DEPARTMENT_OTHER)
Admission: RE | Admit: 2015-07-20 | Discharge: 2015-07-20 | Disposition: A | Discharge status: Home / Self Care | Payer: 59 | Source: Ambulatory Visit | Attending: Orthopaedic Surgery | Admitting: Orthopaedic Surgery

## 2015-07-20 DIAGNOSIS — Z6833 Body mass index (BMI) 33.0-33.9, adult: Secondary | ICD-10-CM | POA: Insufficient documentation

## 2015-07-20 DIAGNOSIS — M1990 Unspecified osteoarthritis, unspecified site: Secondary | ICD-10-CM | POA: Diagnosis not present

## 2015-07-20 DIAGNOSIS — S86319A Strain of muscle(s) and tendon(s) of peroneal muscle group at lower leg level, unspecified leg, initial encounter: Secondary | ICD-10-CM | POA: Diagnosis present

## 2015-07-20 DIAGNOSIS — Z87891 Personal history of nicotine dependence: Secondary | ICD-10-CM | POA: Diagnosis not present

## 2015-07-20 DIAGNOSIS — S86311A Strain of muscle(s) and tendon(s) of peroneal muscle group at lower leg level, right leg, initial encounter: Secondary | ICD-10-CM | POA: Insufficient documentation

## 2015-07-20 DIAGNOSIS — F329 Major depressive disorder, single episode, unspecified: Secondary | ICD-10-CM | POA: Insufficient documentation

## 2015-07-20 DIAGNOSIS — M65871 Other synovitis and tenosynovitis, right ankle and foot: Secondary | ICD-10-CM | POA: Insufficient documentation

## 2015-07-20 DIAGNOSIS — S86311D Strain of muscle(s) and tendon(s) of peroneal muscle group at lower leg level, right leg, subsequent encounter: Secondary | ICD-10-CM | POA: Diagnosis not present

## 2015-07-20 HISTORY — DX: Nausea with vomiting, unspecified: R11.2

## 2015-07-20 HISTORY — DX: Unspecified osteoarthritis, unspecified site: M19.90

## 2015-07-20 HISTORY — DX: Major depressive disorder, single episode, unspecified: F32.9

## 2015-07-20 HISTORY — DX: Other specified postprocedural states: Z98.890

## 2015-07-20 HISTORY — DX: Adverse effect of unspecified anesthetic, initial encounter: T41.45XA

## 2015-07-20 HISTORY — DX: Gastro-esophageal reflux disease without esophagitis: K21.9

## 2015-07-20 HISTORY — DX: Other complications of anesthesia, initial encounter: T88.59XA

## 2015-07-20 HISTORY — PX: ANKLE RECONSTRUCTION: SHX1151

## 2015-07-20 HISTORY — DX: Depression, unspecified: F32.A

## 2015-07-20 SURGERY — RECONSTRUCTION, ANKLE
Anesthesia: General | Site: Ankle | Laterality: Right

## 2015-07-20 MED ORDER — HYDROMORPHONE HCL 1 MG/ML IJ SOLN
INTRAMUSCULAR | Status: AC
  Filled 2015-07-20: qty 1

## 2015-07-20 MED ORDER — PROMETHAZINE HCL 25 MG/ML IJ SOLN
INTRAMUSCULAR | Status: AC
  Filled 2015-07-20: qty 1

## 2015-07-20 MED ORDER — ONDANSETRON HCL 4 MG/2ML IJ SOLN
INTRAMUSCULAR | Status: DC | PRN
  Administered 2015-07-20: 4 mg via INTRAVENOUS

## 2015-07-20 MED ORDER — BUPIVACAINE HCL (PF) 0.25 % IJ SOLN
INTRAMUSCULAR | Status: DC | PRN
  Administered 2015-07-20: 10 mL

## 2015-07-20 MED ORDER — CHLORHEXIDINE GLUCONATE 4 % EX LIQD: 60.0000 mL | Freq: Once | CUTANEOUS | Status: DC

## 2015-07-20 MED ORDER — CEFAZOLIN SODIUM-DEXTROSE 2-3 GM-% IV SOLR
INTRAVENOUS | Status: DC | PRN
  Administered 2015-07-20: 2 g via INTRAVENOUS

## 2015-07-20 MED ORDER — PROMETHAZINE HCL 25 MG/ML IJ SOLN
6.2500 mg | Freq: Once | INTRAMUSCULAR | Status: AC
  Administered 2015-07-20: 6.25 mg via INTRAVENOUS

## 2015-07-20 MED ORDER — FENTANYL CITRATE (PF) 100 MCG/2ML IJ SOLN
INTRAMUSCULAR | Status: AC
  Filled 2015-07-20: qty 2

## 2015-07-20 MED ORDER — MEPERIDINE HCL 25 MG/ML IJ SOLN: 6.2500 mg | INTRAMUSCULAR | Status: DC | PRN

## 2015-07-20 MED ORDER — SCOPOLAMINE 1 MG/3DAYS TD PT72
1.0000 | MEDICATED_PATCH | Freq: Once | TRANSDERMAL | Status: DC | PRN
  Administered 2015-07-20: 1.5 mg via TRANSDERMAL

## 2015-07-20 MED ORDER — LACTATED RINGERS IV SOLN
INTRAVENOUS | Status: DC
  Administered 2015-07-20 (×2): via INTRAVENOUS

## 2015-07-20 MED ORDER — LIDOCAINE 2% (20 MG/ML) 5 ML SYRINGE
INTRAMUSCULAR | Status: AC
  Filled 2015-07-20: qty 5

## 2015-07-20 MED ORDER — ACETAMINOPHEN 10 MG/ML IV SOLN
1000.0000 mg | Freq: Once | INTRAVENOUS | Status: AC
  Administered 2015-07-20: 1000 mg via INTRAVENOUS

## 2015-07-20 MED ORDER — PROPOFOL 10 MG/ML IV BOLUS
INTRAVENOUS | Status: DC | PRN
  Administered 2015-07-20: 200 mg via INTRAVENOUS

## 2015-07-20 MED ORDER — BUPIVACAINE-EPINEPHRINE (PF) 0.25% -1:200000 IJ SOLN
INTRAMUSCULAR | Status: AC
  Filled 2015-07-20: qty 30

## 2015-07-20 MED ORDER — DEXAMETHASONE SODIUM PHOSPHATE 10 MG/ML IJ SOLN
INTRAMUSCULAR | Status: DC | PRN
  Administered 2015-07-20: 10 mg via INTRAVENOUS

## 2015-07-20 MED ORDER — PROPOFOL 500 MG/50ML IV EMUL
INTRAVENOUS | Status: AC
  Filled 2015-07-20: qty 50

## 2015-07-20 MED ORDER — FENTANYL CITRATE (PF) 100 MCG/2ML IJ SOLN
50.0000 ug | INTRAMUSCULAR | Status: AC | PRN
  Administered 2015-07-20 (×3): 50 ug via INTRAVENOUS

## 2015-07-20 MED ORDER — MIDAZOLAM HCL 2 MG/2ML IJ SOLN
1.0000 mg | INTRAMUSCULAR | Status: DC | PRN
  Administered 2015-07-20: 2 mg via INTRAVENOUS

## 2015-07-20 MED ORDER — EPHEDRINE SULFATE 50 MG/ML IJ SOLN: INTRAMUSCULAR | Status: DC | PRN

## 2015-07-20 MED ORDER — OXYCODONE HCL 5 MG PO TABS: 5.0000 mg | ORAL_TABLET | Freq: Once | ORAL | Status: DC | PRN

## 2015-07-20 MED ORDER — ONDANSETRON HCL 4 MG/2ML IJ SOLN
INTRAMUSCULAR | Status: AC
  Filled 2015-07-20: qty 2

## 2015-07-20 MED ORDER — SCOPOLAMINE 1 MG/3DAYS TD PT72
MEDICATED_PATCH | TRANSDERMAL | Status: AC
  Filled 2015-07-20: qty 1

## 2015-07-20 MED ORDER — OXYCODONE-ACETAMINOPHEN 5-325 MG PO TABS: 1.0000 | ORAL_TABLET | ORAL | Status: DC | PRN

## 2015-07-20 MED ORDER — DEXAMETHASONE SODIUM PHOSPHATE 10 MG/ML IJ SOLN
INTRAMUSCULAR | Status: AC
  Filled 2015-07-20: qty 1

## 2015-07-20 MED ORDER — GLYCOPYRROLATE 0.2 MG/ML IJ SOLN: 0.2000 mg | Freq: Once | INTRAMUSCULAR | Status: DC | PRN

## 2015-07-20 MED ORDER — LIDOCAINE HCL (CARDIAC) 20 MG/ML IV SOLN
INTRAVENOUS | Status: DC | PRN
  Administered 2015-07-20: 60 mg via INTRAVENOUS

## 2015-07-20 MED ORDER — MIDAZOLAM HCL 2 MG/2ML IJ SOLN
INTRAMUSCULAR | Status: AC
  Filled 2015-07-20: qty 2

## 2015-07-20 MED ORDER — EPHEDRINE SULFATE 50 MG/ML IJ SOLN
INTRAMUSCULAR | Status: DC | PRN
  Administered 2015-07-20 (×2): 10 mg via INTRAVENOUS

## 2015-07-20 MED ORDER — OXYCODONE HCL 5 MG/5ML PO SOLN: 5.0000 mg | Freq: Once | ORAL | Status: DC | PRN

## 2015-07-20 MED ORDER — CHLORHEXIDINE GLUCONATE 4 % EX LIQD: 60.0000 mL | Freq: Every day | CUTANEOUS | Status: DC

## 2015-07-20 MED ORDER — ACETAMINOPHEN 10 MG/ML IV SOLN
INTRAVENOUS | Status: AC
  Filled 2015-07-20: qty 100

## 2015-07-20 MED ORDER — SODIUM CHLORIDE 0.9 % IV SOLN: INTRAVENOUS | Status: DC

## 2015-07-20 MED ORDER — HYDROMORPHONE HCL 1 MG/ML IJ SOLN
0.2500 mg | INTRAMUSCULAR | Status: DC | PRN
  Administered 2015-07-20 (×3): 0.5 mg via INTRAVENOUS

## 2015-07-20 MED ORDER — CEFAZOLIN SODIUM-DEXTROSE 2-4 GM/100ML-% IV SOLN
INTRAVENOUS | Status: AC
  Filled 2015-07-20: qty 100

## 2015-07-20 MED ORDER — BUPIVACAINE HCL (PF) 0.25 % IJ SOLN
INTRAMUSCULAR | Status: AC
  Filled 2015-07-20: qty 30

## 2015-07-20 MED FILL — OXYCODONE/APAP 5-325: 5-325 | 5 days supply | Qty: 60 | Fill #0

## 2015-07-20 SURGICAL SUPPLY — 54 items
BAG DECANTER FOR FLEXI CONT (MISCELLANEOUS) ×2 IMPLANT
BANDAGE ACE 4X5 VEL STRL LF (GAUZE/BANDAGES/DRESSINGS) ×2 IMPLANT
BANDAGE ACE 6X5 VEL STRL LF (GAUZE/BANDAGES/DRESSINGS) ×2 IMPLANT
BLADE SURG 15 STRL LF DISP TIS (BLADE) ×1 IMPLANT
BLADE SURG 15 STRL SS (BLADE) ×1
BNDG COHESIVE 3X5 TAN STRL LF (GAUZE/BANDAGES/DRESSINGS) IMPLANT
BNDG ESMARK 4X9 LF (GAUZE/BANDAGES/DRESSINGS) ×2 IMPLANT
BNDG GAUZE ELAST 4 BULKY (GAUZE/BANDAGES/DRESSINGS) ×6 IMPLANT
COVER BACK TABLE 60X90IN (DRAPES) ×2 IMPLANT
COVER MAYO STAND STRL (DRAPES) ×2 IMPLANT
DRAPE EXTREMITY T 121X128X90 (DRAPE) ×2 IMPLANT
DRAPE IMP U-DRAPE 54X76 (DRAPES) ×2 IMPLANT
DRAPE INCISE IOBAN 66X45 STRL (DRAPES) IMPLANT
DRAPE OEC MINIVIEW 54X84 (DRAPES) ×2 IMPLANT
DRSG EMULSION OIL 3X3 NADH (GAUZE/BANDAGES/DRESSINGS) ×2 IMPLANT
DRSG PAD ABDOMINAL 8X10 ST (GAUZE/BANDAGES/DRESSINGS) IMPLANT
DURAPREP 26ML APPLICATOR (WOUND CARE) ×2 IMPLANT
FORCEPS BIPOLAR SPETZLER 8 1.0 (NEUROSURGERY SUPPLIES) ×2 IMPLANT
GLOVE BIO SURGEON STRL SZ 6.5 (GLOVE) ×2 IMPLANT
GLOVE BIO SURGEON STRL SZ7.5 (GLOVE) IMPLANT
GLOVE BIOGEL PI IND STRL 7.0 (GLOVE) ×1 IMPLANT
GLOVE BIOGEL PI IND STRL 8 (GLOVE) ×1 IMPLANT
GLOVE BIOGEL PI INDICATOR 7.0 (GLOVE) ×1
GLOVE BIOGEL PI INDICATOR 8 (GLOVE) ×1
GLOVE ECLIPSE 8.0 STRL XLNG CF (GLOVE) ×2 IMPLANT
GOWN STRL REUS W/ TWL LRG LVL3 (GOWN DISPOSABLE) ×1 IMPLANT
GOWN STRL REUS W/ TWL XL LVL3 (GOWN DISPOSABLE) ×1 IMPLANT
GOWN STRL REUS W/TWL LRG LVL3 (GOWN DISPOSABLE) ×1
GOWN STRL REUS W/TWL XL LVL3 (GOWN DISPOSABLE) ×1
NEEDLE HYPO 22GX1.5 SAFETY (NEEDLE) ×2 IMPLANT
PACK BASIN DAY SURGERY FS (CUSTOM PROCEDURE TRAY) ×2 IMPLANT
PAD CAST 4YDX4 CTTN HI CHSV (CAST SUPPLIES) ×2 IMPLANT
PADDING CAST ABS 4INX4YD NS (CAST SUPPLIES)
PADDING CAST ABS COTTON 4X4 ST (CAST SUPPLIES) IMPLANT
PADDING CAST COTTON 4X4 STRL (CAST SUPPLIES) ×2
PENCIL BUTTON HOLSTER BLD 10FT (ELECTRODE) IMPLANT
SHEET MEDIUM DRAPE 40X70 STRL (DRAPES) ×2 IMPLANT
SPLINT FAST PLASTER 5X30 (CAST SUPPLIES) ×15
SPLINT PLASTER CAST FAST 5X30 (CAST SUPPLIES) ×15 IMPLANT
SPONGE LAP 4X18 X RAY DECT (DISPOSABLE) ×2 IMPLANT
STAPLER VISISTAT 35W (STAPLE) IMPLANT
STOCKINETTE 6  STRL (DRAPES) ×1
STOCKINETTE 6 STRL (DRAPES) ×1 IMPLANT
SUCTION FRAZIER HANDLE 10FR (MISCELLANEOUS) ×1
SUCTION TUBE FRAZIER 10FR DISP (MISCELLANEOUS) ×1 IMPLANT
SUT MNCRL AB 4-0 PS2 18 (SUTURE) ×2 IMPLANT
SUT PROLENE 3 0 PS 2 (SUTURE) IMPLANT
SUT VIC AB 2-0 SH 27 (SUTURE) ×1
SUT VIC AB 2-0 SH 27XBRD (SUTURE) ×1 IMPLANT
SUT VICRYL 4-0 PS2 18IN ABS (SUTURE) IMPLANT
SYR 20CC LL (SYRINGE) ×2 IMPLANT
SYR BULB 3OZ (MISCELLANEOUS) ×2 IMPLANT
TUBE CONNECTING 20X1/4 (TUBING) ×2 IMPLANT
UNDERPAD 30X30 (UNDERPADS AND DIAPERS) ×2 IMPLANT

## 2015-07-20 NOTE — Transfer of Care (Signed)
Immediate Anesthesia Transfer of Care Note  Patient: Jody Blackwell  Procedure(s) Performed: Procedure(s): OPEN EXPLORATION OF LATERAL RIGHT PERONEUS BREVIS TENDON WITH REPAIR  (Right)  Patient Location: PACU  Anesthesia Type:General  Level of Consciousness: awake and patient cooperative  Airway & Oxygen Therapy: Patient Spontanous Breathing and Patient connected to face mask oxygen  Post-op Assessment: Report given to RN and Post -op Vital signs reviewed and stable  Post vital signs: Reviewed and stable  Last Vitals:  Filed Vitals:   07/20/15 0623  BP: 124/58  Pulse: 77  Temp: 36.6 C  Resp: 20    Last Pain: There were no vitals filed for this visit.       Complications: No apparent anesthesia complications

## 2015-07-20 NOTE — Anesthesia Postprocedure Evaluation (Signed)
Anesthesia Post Note  Patient: Damian Baldassarre Lema  Procedure(s) Performed: Procedure(s) (LRB): OPEN EXPLORATION OF LATERAL RIGHT PERONEUS BREVIS TENDON WITH REPAIR  (Right)  Patient location during evaluation: PACU Anesthesia Type: General Level of consciousness: awake and alert Pain management: pain level controlled Vital Signs Assessment: post-procedure vital signs reviewed and stable Respiratory status: spontaneous breathing, nonlabored ventilation and respiratory function stable Cardiovascular status: blood pressure returned to baseline and stable Postop Assessment: no signs of nausea or vomiting Anesthetic complications: no    Last Vitals:  Filed Vitals:   07/20/15 0930 07/20/15 0945  BP: 129/75 121/71  Pulse: 86 81  Temp:    Resp: 11 12    Last Pain:  Filed Vitals:   07/20/15 0950  PainSc: 0-No pain                 Ehsan Corvin A

## 2015-07-20 NOTE — Discharge Instructions (Signed)

## 2015-07-20 NOTE — Anesthesia Preprocedure Evaluation (Addendum)
Anesthesia Evaluation  Patient identified by MRN, date of birth, ID band Patient awake    Reviewed: Allergy & Precautions, NPO status , Patient's Chart, lab work & pertinent test results  History of Anesthesia Complications (+) PONV  Airway Mallampati: I  TM Distance: >3 FB Neck ROM: Full    Dental  (+) Teeth Intact, Dental Advisory Given   Pulmonary former smoker,    breath sounds clear to auscultation       Cardiovascular  Rhythm:Regular Rate:Normal     Neuro/Psych  Headaches, PSYCHIATRIC DISORDERS Depression    GI/Hepatic GERD  Medicated,  Endo/Other  Morbid obesity  Renal/GU      Musculoskeletal  (+) Arthritis , Osteoarthritis,    Abdominal   Peds  Hematology   Anesthesia Other Findings   Reproductive/Obstetrics                            Anesthesia Physical Anesthesia Plan  ASA: II  Anesthesia Plan: General   Post-op Pain Management:    Induction: Intravenous  Airway Management Planned: LMA  Additional Equipment:   Intra-op Plan:   Post-operative Plan: Extubation in OR  Informed Consent: I have reviewed the patients History and Physical, chart, labs and discussed the procedure including the risks, benefits and alternatives for the proposed anesthesia with the patient or authorized representative who has indicated his/her understanding and acceptance.   Dental advisory given  Plan Discussed with: CRNA, Anesthesiologist and Surgeon  Anesthesia Plan Comments:         Anesthesia Quick Evaluation

## 2015-07-20 NOTE — Op Note (Signed)
NAMEZUZU, BEFORT               ACCOUNT NO.:  [id number]  MEDICAL RECORD NO.:  [id number]  LOCATION:                                 FACILITY:  PHYSICIAN:  Vonna Kotyk. Jarryd Gratz, M.D.DATE OF BIRTH:  September 06, 1965  DATE OF PROCEDURE:  07/20/2015 DATE OF DISCHARGE:                              OPERATIVE REPORT   PREOPERATIVE DIAGNOSIS:  Tear of peroneus brevis tendon, right ankle.  POSTOPERATIVE DIAGNOSIS:  Tear of peroneus brevis tendon, right ankle.  PROCEDURE:  Open exploration lateral right ankle with repair of peroneus brevis tendon tear and tendon sheath synovectomy.  SURGEON:  Vonna Kotyk. Durward Fortes, M.D.  ASSISTANT:  Biagio Borg, PA-C.  ANESTHESIA:  General.  COMPLICATIONS:  None.  HISTORY:  A 50 year old female, sustained an inversion stress injury to her right ankle in the fall of last year.  She has had persistent pain along the lateral aspect of her ankle to the point of compromise, despite immobilization and limited activity she continued to have pain. Accordingly an MRI scan was performed, demonstrating a split tear of the peroneus brevis tendon at the level of the lateral malleolus.  She has had some swelling and localized discomfort.  There was no evidence that she had an ankle sprain by MR scan or with stress views.  She is now to have exploration of the tendon with repair.  DESCRIPTION OF PROCEDURE:  Ms. Schifano was met with the family in the holding area.  I identified the right ankle as appropriate operative site and marked it accordingly.  The patient was then transported to room #5 and placed under general anesthesia without difficulty.  A tourniquet was applied to the right thigh.  The right lower extremity was then prepped with DuraPrep in the tips of the toes to the knee. Sterile draping was performed.  Time-out was called.  There was a bump placed beneath her hip to have a better visualization of lateral aspect of her ankle.  Skin incision was  outlined along the peroneus brevis and longus tendons.  Beginning just proximal to the lateral malleolus and extending approximately 3 inches, via sharp dissection the incision was carried down to subcutaneous tissue.  Then via blunt dissection, the soft tissue was elevated from the peroneal tendon sheath.  The sural nerve was identified and carefully retracted. Very small veins were Bovie coagulated and larger veins were maintained.  I incised the peroneus tendon sheath, it did not appear to be patulous, there was no evidence that there was subluxation or dislocation of tendons.  There was mild synovitis in which synovectomy was performed. A tenolysis of the peroneus longus and brevis tendons was performed. The peroneus longus tendon appeared to be intact without evidence of pathology.  There were several split tears of the peroneus brevis, beginning just proximal to the lateral malleolus extending distally.  There also was a prominent muscle belly within the groove, this was carefully resected. Any nonviable tissue of the peroneus brevis was removed.  The remaining tendon was well over 50% of its normal width and accordingly it was tubularized with 2-0 Vicryl.  I did not see an os peroneum.  The tendon sheath appeared to be clear, at  that point, I irrigated the wound, I closed the sheath with a running 2-0 Vicryl.  Wound was again irrigated.  The subcu was closed with 3-0 Monocryl.  Skin closed with Steri-Strips over benzoin.  Sterile bulky dressing was applied, followed by a posterior splint in neutral.  PLAN:  Nonweightbearing.  Office next week.  Percocet for pain.     Vonna Kotyk. Durward Fortes, M.D.   ______________________________ Vonna Kotyk. Durward Fortes, M.D.    PWW/MEDQ  D:  07/20/2015  T:  07/20/2015  Job:  [id number]

## 2015-07-20 NOTE — Op Note (Signed)
PATIENT ID:      Jody Blackwell  MRN:     [id number] DOB/AGE:    03/13/1965 / 50 y.o.       OPERATIVE REPORT    DATE OF PROCEDURE:  07/20/2015       PREOPERATIVE DIAGNOSIS:   RIGHT PERONEUS BREVIS TEAR                                                       Estimated body mass index is 33.78 kg/(m^2) as calculated from the following:   Height as of this encounter: 5\' 5"  (1.651 m).   Weight as of this encounter: 92.08 kg (203 lb).     POSTOPERATIVE DIAGNOSIS:   RIGHT PERONEUS BREVIS TEAR                                                                     Estimated body mass index is 33.78 kg/(m^2) as calculated from the following:   Height as of this encounter: 5\' 5"  (1.651 m).   Weight as of this encounter: 92.08 kg (203 lb).     PROCEDURE:  Procedure(s): OPEN EXPLORATION OF LATERAL RIGHT ANKLE WITH REPAIR OF PERONEUS BREVIS  TENDON TEAR,SYNOVECTOMY OF TENDON SHEATH     SURGEON:  [name], MD    ASSISTANT:   Biagio Borg, PA-C   (Present and scrubbed throughout the case, critical for assistance with exposure, retraction, instrumentation, and closure.)          ANESTHESIA: general     DRAINS: none :      TOURNIQUET TIME: * Missing tourniquet times found for documented tourniquets in log:  [id number] *    COMPLICATIONS:  None   CONDITION:  stable  PROCEDURE IN DETAIL: [id number]   Jody Blackwell 07/20/2015, 8:29 AM

## 2015-07-20 NOTE — Anesthesia Procedure Notes (Signed)
Procedure Name: LMA Insertion Date/Time: 07/20/2015 7:42 AM Performed by: Vinia Jemmott D Pre-anesthesia Checklist: Patient identified, Emergency Drugs available, Suction available and Patient being monitored Patient Re-evaluated:Patient Re-evaluated prior to inductionOxygen Delivery Method: Circle System Utilized Preoxygenation: Pre-oxygenation with 100% oxygen Intubation Type: IV induction Ventilation: Mask ventilation without difficulty LMA: LMA inserted LMA Size: 4.0 Number of attempts: 1 Airway Equipment and Method: Bite block Placement Confirmation: positive ETCO2 Tube secured with: Tape Dental Injury: Teeth and Oropharynx as per pre-operative assessment

## 2015-07-20 NOTE — H&P (Signed)
  The recent History & Physical has been reviewed. I have personally examined the patient today. There is no interval change to the documented History & Physical. The patient would like to proceed with the procedure.  [name] W 07/20/2015,  7:35 AM

## 2015-07-21 ENCOUNTER — Encounter (HOSPITAL_BASED_OUTPATIENT_CLINIC_OR_DEPARTMENT_OTHER): Payer: Self-pay | Admitting: Orthopaedic Surgery

## 2015-07-25 MED FILL — ZOMIG 5 MG NASAL SPRAY: 5 | 30 days supply | Qty: 6 | Fill #1

## 2015-07-26 DIAGNOSIS — S86311D Strain of muscle(s) and tendon(s) of peroneal muscle group at lower leg level, right leg, subsequent encounter: Secondary | ICD-10-CM | POA: Diagnosis not present

## 2015-07-26 MED FILL — traMADol HCL 50 MG TABS: 50 | 7 days supply | Qty: 40 | Fill #0

## 2015-08-04 DIAGNOSIS — S86311D Strain of muscle(s) and tendon(s) of peroneal muscle group at lower leg level, right leg, subsequent encounter: Secondary | ICD-10-CM | POA: Diagnosis not present

## 2015-08-10 DIAGNOSIS — S86311D Strain of muscle(s) and tendon(s) of peroneal muscle group at lower leg level, right leg, subsequent encounter: Secondary | ICD-10-CM | POA: Diagnosis not present

## 2015-08-10 MED FILL — traMADol HCL 50 MG TABS: 50 | 7 days supply | Qty: 40 | Fill #0

## 2015-08-11 MED FILL — PREMARIN 0.625 MG TABLET: 0.625 | 90 days supply | Qty: 90 | Fill #0

## 2015-08-25 MED FILL — VERAPAMIL ER 120 MG CAPSULE: 120 | 30 days supply | Qty: 60 | Fill #0

## 2015-08-28 MED FILL — levETIRAcetam 500 MG TABS: 500 | 30 days supply | Qty: 30 | Fill #1

## 2015-09-01 DIAGNOSIS — R5381 Other malaise: Secondary | ICD-10-CM | POA: Diagnosis not present

## 2015-09-01 DIAGNOSIS — E559 Vitamin D deficiency, unspecified: Secondary | ICD-10-CM | POA: Diagnosis not present

## 2015-09-11 MED FILL — raNITIdine HCL 150 MG TABS: 150 | 30 days supply | Qty: 60 | Fill #0

## 2015-09-25 MED FILL — levETIRAcetam 500 MG TABS: 500 | 30 days supply | Qty: 30 | Fill #2

## 2015-09-25 MED FILL — VERAPAMIL ER 120 MG CAPSULE: 120 | 30 days supply | Qty: 60 | Fill #1

## 2015-09-27 DIAGNOSIS — F329 Major depressive disorder, single episode, unspecified: Secondary | ICD-10-CM | POA: Diagnosis not present

## 2015-09-27 MED FILL — ESCITALOPRAM 10 MG TABLET: 10 | 30 days supply | Qty: 30 | Fill #0

## 2015-10-10 MED FILL — ESOMEPRAZOLE MAG DR 40 MG C: 40 | 30 days supply | Qty: 30 | Fill #0

## 2015-10-10 MED FILL — raNITIdine HCL 150 MG TABS: 150 | 30 days supply | Qty: 60 | Fill #0

## 2015-10-14 ENCOUNTER — Encounter (INDEPENDENT_AMBULATORY_CARE_PROVIDER_SITE_OTHER): Payer: Self-pay

## 2015-10-23 MED FILL — ESCITALOPRAM 10 MG TABLET: 10 | 30 days supply | Qty: 30 | Fill #1

## 2015-10-23 MED FILL — ZOMIG 5 MG NASAL SPRAY: 5 | 30 days supply | Qty: 6 | Fill #0

## 2015-10-23 MED FILL — levETIRAcetam 500 MG TABS: 500 | 30 days supply | Qty: 30 | Fill #3

## 2015-10-23 MED FILL — VERAPAMIL ER 120 MG CAPSULE: 120 | 30 days supply | Qty: 60 | Fill #2

## 2015-11-06 DIAGNOSIS — G43719 Chronic migraine without aura, intractable, without status migrainosus: Secondary | ICD-10-CM | POA: Diagnosis not present

## 2015-11-06 DIAGNOSIS — G43111 Migraine with aura, intractable, with status migrainosus: Secondary | ICD-10-CM | POA: Diagnosis not present

## 2015-11-06 DIAGNOSIS — G43019 Migraine without aura, intractable, without status migrainosus: Secondary | ICD-10-CM | POA: Diagnosis not present

## 2015-11-07 MED FILL — tiZANidine HCL 4 MG TABS: 4 | 30 days supply | Qty: 30 | Fill #0

## 2015-11-07 MED FILL — levETIRAcetam 500 MG TABS: 500 | 30 days supply | Qty: 45 | Fill #0

## 2015-11-08 MED FILL — ESOMEPRAZOLE MAG DR 40 MG C: 40 | 30 days supply | Qty: 30 | Fill #1

## 2015-11-08 MED FILL — PREMARIN 0.625 MG TABLET: 0.625 | 90 days supply | Qty: 90 | Fill #1

## 2015-11-08 MED FILL — raNITIdine HCL 150 MG TABS: 150 | 30 days supply | Qty: 60 | Fill #1

## 2015-11-21 ENCOUNTER — Encounter (HOSPITAL_BASED_OUTPATIENT_CLINIC_OR_DEPARTMENT_OTHER): Payer: Self-pay | Admitting: *Deleted

## 2015-11-21 ENCOUNTER — Emergency Department (HOSPITAL_BASED_OUTPATIENT_CLINIC_OR_DEPARTMENT_OTHER)
Admission: EM | Admit: 2015-11-21 | Discharge: 2015-11-21 | Disposition: A | Discharge status: Home / Self Care | Payer: 59 | Attending: Emergency Medicine | Admitting: Emergency Medicine

## 2015-11-21 DIAGNOSIS — Z87891 Personal history of nicotine dependence: Secondary | ICD-10-CM | POA: Insufficient documentation

## 2015-11-21 DIAGNOSIS — Z79899 Other long term (current) drug therapy: Secondary | ICD-10-CM | POA: Diagnosis not present

## 2015-11-21 DIAGNOSIS — R51 Headache: Secondary | ICD-10-CM | POA: Insufficient documentation

## 2015-11-21 DIAGNOSIS — R519 Headache, unspecified: Secondary | ICD-10-CM

## 2015-11-21 DIAGNOSIS — R110 Nausea: Secondary | ICD-10-CM | POA: Insufficient documentation

## 2015-11-21 MED ORDER — PROCHLORPERAZINE EDISYLATE 5 MG/ML IJ SOLN
10.0000 mg | Freq: Once | INTRAMUSCULAR | Status: AC
  Administered 2015-11-21: 10 mg via INTRAVENOUS
  Filled 2015-11-21: qty 2

## 2015-11-21 MED ORDER — DIPHENHYDRAMINE HCL 50 MG/ML IJ SOLN
25.0000 mg | Freq: Once | INTRAMUSCULAR | Status: AC
  Administered 2015-11-21: 25 mg via INTRAVENOUS
  Filled 2015-11-21: qty 1

## 2015-11-21 MED ORDER — ACETAMINOPHEN 325 MG PO TABS
650.0000 mg | ORAL_TABLET | Freq: Once | ORAL | Status: AC
  Administered 2015-11-21: 650 mg via ORAL
  Filled 2015-11-21: qty 2

## 2015-11-21 MED ORDER — SODIUM CHLORIDE 0.9 % IV BOLUS (SEPSIS)
1000.0000 mL | Freq: Once | INTRAVENOUS | Status: AC
  Administered 2015-11-21: 1000 mL via INTRAVENOUS

## 2015-11-21 MED ORDER — METHYLPREDNISOLONE SODIUM SUCC 125 MG IJ SOLR
125.0000 mg | Freq: Once | INTRAMUSCULAR | Status: AC
  Administered 2015-11-21: 125 mg via INTRAVENOUS
  Filled 2015-11-21: qty 2

## 2015-11-21 MED ORDER — KETOROLAC TROMETHAMINE 30 MG/ML IJ SOLN
30.0000 mg | Freq: Once | INTRAMUSCULAR | Status: AC
Start: 2015-11-21 — End: 2015-11-21
  Administered 2015-11-21: 30 mg via INTRAVENOUS
  Filled 2015-11-21: qty 1

## 2015-11-21 MED ORDER — DIPHENHYDRAMINE HCL 50 MG/ML IJ SOLN
12.5000 mg | Freq: Once | INTRAMUSCULAR | Status: AC
  Administered 2015-11-21: 12.5 mg via INTRAVENOUS
  Filled 2015-11-21: qty 1

## 2015-11-21 NOTE — ED Provider Notes (Signed)
[facility] Provider Note   CSN: [id number] Arrival date & time: 11/21/15  [id number]     History   Chief Complaint Chief Complaint  Patient presents with  . Headache    HPI Jody Blackwell is a 50 y.o. female.  Jody Blackwell is a 50 y.o. Female who presents to the ED complaining of a migraine headache for the past 3 days. Patient has a history of migraine headaches. She sees a neurologist and has completed her treatment plan. She complains of a right-sided posterior headache which is typical for her migraines. She reports she has taken her verapamil and Keppra that she takes daily for migraines. She took [name] and Zofran for her symptoms. This is been without relief. She reports continued right-sided posterior headache and nausea. She reports sensitivity to light and sound. She denies any head injury or trauma. Patient denies fevers, neck stiffness, double vision, numbness, eye pain, tingling, weakness, chest pain, shortness of breath, abdominal pain, diarrhea, or rashes.   The history is provided by the patient. No language interpreter was used.    Past Medical History:  Diagnosis Date  . Arthritis    OA in hands, hip and feet  . Chronic sinusitis   . Complication of anesthesia   . Depression   . GERD (gastroesophageal reflux disease)   . Joint pain   . Migraine   . PONV (postoperative nausea and vomiting)     Patient Active Problem List   Diagnosis Date Noted  . Peroneal tendon tear 07/20/2015  . Left ankle pain 08/03/2010  . Left shoulder pain 06/29/2010    Past Surgical History:  Procedure Laterality Date  . ABDOMINAL HYSTERECTOMY    . ANKLE RECONSTRUCTION Right 07/20/2015   Procedure: OPEN EXPLORATION OF LATERAL RIGHT PERONEUS BREVIS TENDON WITH REPAIR ;  Surgeon: Garald Balding, MD;  Location: [facility];  Service: Orthopedics;  Laterality: Right;  . CESAREAN SECTION     x 2  . CHOLECYSTECTOMY  2012   lap choli  . MASS  EXCISION  06/03/2011   Procedure: EXCISION MASS;  Surgeon: Schuyler Amor, MD;  Location: [facility];  Service: Orthopedics;  Laterality: Right;  EXCISION MASS RIGHT MIDDLE FINGER   . NASAL SINUS SURGERY  2007, 2010    OB History    No data available       Home Medications    Prior to Admission medications   Medication Sig Start Date End Date Taking? Authorizing Provider  calcium-[name] 250-100 MG-UNIT tablet Take 1 tablet by mouth 2 (two) times daily.   Yes Historical Provider, MD  escitalopram (LEXAPRO) 5 MG tablet Take 5 mg by mouth daily.   Yes Historical Provider, MD  esomeprazole ([name]) 40 MG capsule Take 40 mg by mouth daily at 12 noon.   Yes Historical Provider, MD  estrogens, conjugated, ([name]) 0.625 MG tablet Take 0.625 mg by mouth daily.     Yes Historical Provider, MD  levETIRAcetam (KEPPRA) 500 MG tablet Take 500 mg by mouth 2 (two) times daily.   Yes Historical Provider, MD  magnesium hydroxide (MILK OF MAGNESIA) 400 MG/5ML suspension Take by mouth daily as needed for mild constipation.   Yes Historical Provider, MD  ondansetron (ZOFRAN) 4 MG tablet Take 1 tablet (4 mg total) by mouth every 8 (eight) hours as needed for nausea or vomiting. 10/31/14  Yes Loleta Chance, MD  TIZANIDINE HCL PO Take by mouth.   Yes Historical Provider, MD  verapamil (CALAN-SR) 120 MG CR tablet Take 120 mg by mouth 2 (two) times daily.   Yes Historical Provider, MD  zolmitriptan ([name]) 5 MG nasal solution Place into the nose as needed for migraine.   Yes Historical Provider, MD    Family History Family History  Problem Relation Age of Onset  . Diabetes Mother   . Diabetes Father   . Heart attack Father   . Hypertension Father   . Diabetes Maternal Grandfather   . Heart attack Maternal Grandfather   . Hypertension Maternal Grandfather   . Diabetes Paternal Grandfather   . Heart attack Paternal Grandfather   . Hypertension Paternal Grandfather     Social  History Social History  Substance Use Topics  . Smoking status: Former Smoker    Quit date: 05/30/1989  . Smokeless tobacco: Never Used  . Alcohol use No     Allergies   Aspirin; Ibuprofen; Metoclopramide hcl; Triptans; and Zithromax [azithromycin dihydrate]   Review of Systems Review of Systems  Constitutional: Negative for chills and fever.  HENT: Negative for congestion and sore throat.   Eyes: Positive for photophobia. Negative for pain and visual disturbance.  Respiratory: Negative for cough and shortness of breath.   Cardiovascular: Negative for chest pain.  Gastrointestinal: Positive for nausea. Negative for abdominal pain, diarrhea and vomiting.  Genitourinary: Negative for dysuria.  Musculoskeletal: Negative for back pain and neck stiffness.  Skin: Negative for rash.  Neurological: Positive for headaches. Negative for dizziness, syncope, speech difficulty, weakness, light-headedness and numbness.     Physical Exam Updated Vital Signs BP 104/59 (BP Location: Left Arm)   Pulse 67   Temp 98.1 F (36.7 C) (Oral)   Resp 18   Ht 5\' 5"  (1.651 m)   Wt 88.5 kg   SpO2 97%   BMI 32.45 kg/m   Physical Exam  Constitutional: She is oriented to person, place, and time. She appears well-developed and well-nourished. No distress.  Nontoxic appearing.  HENT:  Head: Normocephalic and atraumatic.  Right Ear: External ear normal.  Left Ear: External ear normal.  Mouth/Throat: Oropharynx is clear and moist.  Bilateral tympanic membranes are pearly-gray without erythema or loss of landmarks.  No temporal edema or tenderness.  Eyes: Conjunctivae and EOM are normal. Pupils are equal, round, and reactive to light. Right eye exhibits no discharge. Left eye exhibits no discharge.  Neck: Normal range of motion. Neck supple. No JVD present.  No meningeal signs.  Cardiovascular: Normal rate, regular rhythm, normal heart sounds and intact distal pulses.  Exam reveals no gallop and no  friction rub.   No murmur heard. HR 88.   Pulmonary/Chest: Effort normal and breath sounds normal. No stridor. No respiratory distress. She has no wheezes. She has no rales.  Abdominal: Soft. Bowel sounds are normal. There is no tenderness. There is no guarding.  Abdomen is soft nontender to palpation.  Musculoskeletal: Normal range of motion. She exhibits no edema or tenderness.  Lymphadenopathy:    She has no cervical adenopathy.  Neurological: She is alert and oriented to person, place, and time. No cranial nerve deficit. Coordination normal.  Patient is alert and oriented 3. Cranial nerves are intact. Speech is clear and coherent. EOMs are intact. Vision is grossly intact. No pronator drift. Normal gait. Sensation is intact her bilateral upper and lower extremities.  Skin: Skin is warm and dry. Capillary refill takes less than 2 seconds. No rash noted. She is not diaphoretic. No erythema. No pallor.  Psychiatric:  She has a normal mood and affect. Her behavior is normal.  Nursing note and vitals reviewed.    ED Treatments / Results  Labs (all labs ordered are listed, but only abnormal results are displayed) Labs Reviewed - No data to display  EKG  EKG Interpretation None       Radiology No results found.  Procedures Procedures (including critical care time)  Medications Ordered in ED Medications  sodium chloride 0.9 % bolus 1,000 mL (0 mLs Intravenous Stopped 11/21/15 1116)  diphenhydrAMINE (BENADRYL) injection 25 mg (25 mg Intravenous Given 11/21/15 1032)  ketorolac (TORADOL) 30 MG/ML injection 30 mg (30 mg Intravenous Given 11/21/15 1033)  methylPREDNISolone sodium succinate (SOLU-MEDROL) 125 mg/2 mL injection 125 mg (125 mg Intravenous Given 11/21/15 1033)  acetaminophen (TYLENOL) tablet 650 mg (650 mg Oral Given 11/21/15 1219)  prochlorperazine (COMPAZINE) injection 10 mg (10 mg Intravenous Given 11/21/15 1328)  diphenhydrAMINE (BENADRYL) injection 12.5 mg (12.5 mg  Intravenous Given 11/21/15 1328)  sodium chloride 0.9 % bolus 1,000 mL (0 mLs Intravenous Stopped 11/21/15 1437)     Initial Impression / Assessment and Plan / ED Course  I have reviewed the triage vital signs and the nursing notes.  Pertinent labs & imaging results that were available during my care of the patient were reviewed by me and considered in my medical decision making (see chart for details).  Clinical Course   Patient presented to the emergency department with her typical migraine headache. No resolution with home remedies.   Presentation is like pts typical HA and non concerning for [facility], ICH, Meningitis, or temporal arteritis. No focal neurological deficits. Pt is afebrile with no focal neuro deficits, nuchal rigidity, or change in vision. Initially patient reported that Benadryl, Toradol and Solu-Medrol typically help her headaches. We attempted this migraine cocktail as she is allergic to Reglan. This only helped her nausea and brought her pain down to a 7 out of 10. I then used Compazine and Benadryl and patient's headache improved to 1 out of 10. She is tolerating by mouth. She reports being ready for discharge. I encouraged her to follow-up with neurology for recheck. Discussed return precautions. I advised the patient to follow-up with their primary care provider this week. I advised the patient to return to the emergency department with new or worsening symptoms or new concerns. The patient verbalized understanding and agreement with plan.     Final Clinical Impressions(s) / ED Diagnoses   Final diagnoses:  Bad headache    New Prescriptions Discharge Medication List as of 11/21/2015  2:56 PM       Waynetta Pean, PA-C 11/21/15 [place], MD 11/23/15 [phone]

## 2015-11-21 NOTE — ED Notes (Signed)
Pt. Verbalized understanding of discharge instructions the pad in room not working for signature

## 2015-11-21 NOTE — ED Triage Notes (Signed)
C/o back of head pain. States she has migraines. Onset 3 days ago. C/o tunnel vision x 2. N/v.

## 2015-11-22 MED FILL — DICLOFENAC POT 50 MG TABLET: 50 | 3 days supply | Qty: 6 | Fill #0

## 2015-11-23 MED FILL — ESCITALOPRAM 10 MG TABLET: 10 | 30 days supply | Qty: 30 | Fill #2

## 2015-11-23 MED FILL — VERAPAMIL ER 120 MG CAPSULE: 120 | 30 days supply | Qty: 60 | Fill #3

## 2015-11-28 ENCOUNTER — Emergency Department (HOSPITAL_BASED_OUTPATIENT_CLINIC_OR_DEPARTMENT_OTHER): Payer: 59

## 2015-11-28 ENCOUNTER — Emergency Department (HOSPITAL_BASED_OUTPATIENT_CLINIC_OR_DEPARTMENT_OTHER)
Admission: EM | Admit: 2015-11-28 | Discharge: 2015-11-28 | Disposition: A | Discharge status: Home / Self Care | Payer: 59 | Attending: Emergency Medicine | Admitting: Emergency Medicine

## 2015-11-28 ENCOUNTER — Encounter (HOSPITAL_BASED_OUTPATIENT_CLINIC_OR_DEPARTMENT_OTHER): Payer: Self-pay

## 2015-11-28 DIAGNOSIS — R110 Nausea: Secondary | ICD-10-CM | POA: Diagnosis not present

## 2015-11-28 DIAGNOSIS — M542 Cervicalgia: Secondary | ICD-10-CM | POA: Insufficient documentation

## 2015-11-28 DIAGNOSIS — R51 Headache: Secondary | ICD-10-CM | POA: Diagnosis not present

## 2015-11-28 DIAGNOSIS — R519 Headache, unspecified: Secondary | ICD-10-CM

## 2015-11-28 DIAGNOSIS — Z87891 Personal history of nicotine dependence: Secondary | ICD-10-CM | POA: Diagnosis not present

## 2015-11-28 DIAGNOSIS — G43909 Migraine, unspecified, not intractable, without status migrainosus: Secondary | ICD-10-CM | POA: Diagnosis not present

## 2015-11-28 MED ORDER — DEXAMETHASONE SODIUM PHOSPHATE 10 MG/ML IJ SOLN
10.0000 mg | Freq: Once | INTRAMUSCULAR | Status: AC
  Administered 2015-11-28: 10 mg via INTRAVENOUS
  Filled 2015-11-28: qty 1

## 2015-11-28 MED ORDER — KETOROLAC TROMETHAMINE 30 MG/ML IJ SOLN
30.0000 mg | Freq: Once | INTRAMUSCULAR | Status: AC
  Administered 2015-11-28: 30 mg via INTRAVENOUS
  Filled 2015-11-28: qty 1

## 2015-11-28 MED ORDER — SODIUM CHLORIDE 0.9 % IV BOLUS (SEPSIS)
1000.0000 mL | Freq: Once | INTRAVENOUS | Status: AC
  Administered 2015-11-28: 1000 mL via INTRAVENOUS

## 2015-11-28 MED ORDER — PROCHLORPERAZINE EDISYLATE 5 MG/ML IJ SOLN
10.0000 mg | Freq: Once | INTRAMUSCULAR | Status: AC
  Administered 2015-11-28: 10 mg via INTRAVENOUS
  Filled 2015-11-28: qty 2

## 2015-11-28 MED ORDER — DIPHENHYDRAMINE HCL 50 MG/ML IJ SOLN
25.0000 mg | Freq: Once | INTRAMUSCULAR | Status: AC
  Administered 2015-11-28: 25 mg via INTRAVENOUS
  Filled 2015-11-28: qty 1

## 2015-11-28 NOTE — ED Provider Notes (Signed)
[facility] Provider Note   CSN: [id number] Arrival date & time: 11/28/15  1801   By signing my name below, I, Jody Blackwell, attest that this documentation has been prepared under the direction and in the presence of non-physician practitioner, Delsa Grana, PA-C. Electronically Signed: [name], Scribe. 11/28/2015. 6:16 PM.   History   Chief Complaint Chief Complaint  Patient presents with  . Migraine   The history is provided by the patient.  Headache   This is a recurrent problem. The current episode started yesterday. The problem occurs every few hours. The problem has been gradually worsening. The headache is associated with bright light and loud noise. The pain is located in the occipital region. The quality of the pain is described as throbbing. The pain is at a severity of 10/10. The pain is severe. The pain does not radiate. Associated symptoms include nausea. Pertinent negatives include no anorexia, no fever, no malaise/fatigue, no chest pressure, no near-syncope, no orthopnea, no palpitations, no syncope, no shortness of breath and no vomiting. She has tried resting in a darkened room and triptan therapy for the symptoms. The treatment provided no relief.   HPI Comments:  Jody Blackwell is a 50 y.o. female who presents to the Emergency Department complaining of intermittent worsened migraines beginning a week ago but worsening yesterday and today. She reports has a long history of migraines which typically start with left-sided tunnel vision and progress to posterior right sided pain. Pt notes she had a migraine yesterday and that she started her rescue protocol, which did not fully relieve her symptoms. She states she had another slightly worse migraine last night, which caused her to lose sleep. Pt reports her worst migraine was today 4 hours ago. She went through her rescue protocol and also took some Zofran for her nausea and went to sleep to alleviate her  symptoms, but it did not help. Pt describes her current migraine as 8/10 in the posterior right side of her head. She states that having 3 episodes in 24 hours is highly unusual for her and would like imaging to rule out any intracranial problems. Pt endorses associated nausea, visual disturbance, vomiting, photophobia, tinnitus, and neck pain. She denies any change in activity or diet or fever.  Pt states she was seen last week for her migraines and was given Compazine and Benadryl which relieved her headache.  Past Medical History:  Diagnosis Date  . Arthritis    OA in hands, hip and feet  . Chronic sinusitis   . Complication of anesthesia   . Depression   . GERD (gastroesophageal reflux disease)   . Joint pain   . Migraine   . PONV (postoperative nausea and vomiting)     Patient Active Problem List   Diagnosis Date Noted  . Peroneal tendon tear 07/20/2015  . Left ankle pain 08/03/2010  . Left shoulder pain 06/29/2010    Past Surgical History:  Procedure Laterality Date  . ABDOMINAL HYSTERECTOMY    . ANKLE RECONSTRUCTION Right 07/20/2015   Procedure: OPEN EXPLORATION OF LATERAL RIGHT PERONEUS BREVIS TENDON WITH REPAIR ;  Surgeon: Garald Balding, MD;  Location: [facility];  Service: Orthopedics;  Laterality: Right;  . CESAREAN SECTION     x 2  . CHOLECYSTECTOMY  2012   lap choli  . MASS EXCISION  06/03/2011   Procedure: EXCISION MASS;  Surgeon: Schuyler Amor, MD;  Location: [facility];  Service: Orthopedics;  Laterality:  Right;  EXCISION MASS RIGHT MIDDLE FINGER   . NASAL SINUS SURGERY  2007, 2010    OB History    No data available       Home Medications    Prior to Admission medications   Medication Sig Start Date End Date Taking? Authorizing Provider  calcium-[name] 250-100 MG-UNIT tablet Take 1 tablet by mouth 2 (two) times daily.    Historical Provider, MD  escitalopram (LEXAPRO) 5 MG tablet Take 5 mg by mouth daily.     Historical Provider, MD  esomeprazole ([name]) 40 MG capsule Take 40 mg by mouth daily at 12 noon.    Historical Provider, MD  estrogens, conjugated, ([name]) 0.625 MG tablet Take 0.625 mg by mouth daily.      Historical Provider, MD  levETIRAcetam (KEPPRA) 500 MG tablet Take 500 mg by mouth 2 (two) times daily.    Historical Provider, MD  magnesium hydroxide (MILK OF MAGNESIA) 400 MG/5ML suspension Take by mouth daily as needed for mild constipation.    Historical Provider, MD  ondansetron (ZOFRAN) 4 MG tablet Take 1 tablet (4 mg total) by mouth every 8 (eight) hours as needed for nausea or vomiting. 10/31/14   Loleta Chance, MD  TIZANIDINE HCL PO Take by mouth.    Historical Provider, MD  verapamil (CALAN-SR) 120 MG CR tablet Take 120 mg by mouth 2 (two) times daily.    Historical Provider, MD  zolmitriptan ([name]) 5 MG nasal solution Place into the nose as needed for migraine.    Historical Provider, MD    Family History Family History  Problem Relation Age of Onset  . Diabetes Mother   . Diabetes Father   . Heart attack Father   . Hypertension Father   . Diabetes Maternal Grandfather   . Heart attack Maternal Grandfather   . Hypertension Maternal Grandfather   . Diabetes Paternal Grandfather   . Heart attack Paternal Grandfather   . Hypertension Paternal Grandfather     Social History Social History  Substance Use Topics  . Smoking status: Former Smoker    Quit date: 05/30/1989  . Smokeless tobacco: Never Used  . Alcohol use No     Allergies   Aspirin; Ibuprofen; Metoclopramide hcl; Triptans; and Zithromax [azithromycin dihydrate]   Review of Systems Review of Systems  Constitutional: Negative for activity change, appetite change, fever and malaise/fatigue.  HENT: Positive for tinnitus.   Eyes: Positive for photophobia and visual disturbance.  Respiratory: Negative for shortness of breath.   Cardiovascular: Negative for palpitations, orthopnea, syncope and near-syncope.    Gastrointestinal: Positive for nausea. Negative for anorexia and vomiting.  Musculoskeletal: Positive for neck pain.  Neurological: Positive for headaches.  All other systems reviewed and are negative.    Physical Exam Updated Vital Signs BP 107/58   Pulse 69   Temp 97.9 F (36.6 C) (Oral)   Resp 18   Ht 5\' 5"  (1.651 m)   Wt 88.5 kg   SpO2 97%   BMI 32.45 kg/m   Physical Exam  Constitutional: She is oriented to person, place, and time. Vital signs are normal. She appears well-developed and well-nourished. She is cooperative.  Non-toxic appearance. She does not have a sickly appearance. She does not appear ill. No distress.  HENT:  Head: Normocephalic and atraumatic. Head is without right periorbital erythema and without left periorbital erythema.  Right Ear: Hearing, tympanic membrane, external ear and ear canal normal.  Left Ear: Hearing, tympanic membrane, external ear and ear canal  normal.  Nose: Nose normal. No mucosal edema or rhinorrhea. Right sinus exhibits no maxillary sinus tenderness and no frontal sinus tenderness. Left sinus exhibits no maxillary sinus tenderness and no frontal sinus tenderness.  Mouth/Throat: Uvula is midline, oropharynx is clear and moist and mucous membranes are normal. Mucous membranes are not pale and not dry. No uvula swelling. No oropharyngeal exudate, posterior oropharyngeal edema, posterior oropharyngeal erythema or tonsillar abscesses.  Eyes: Conjunctivae and EOM are normal. Pupils are equal, round, and reactive to light. Right eye exhibits no discharge. Left eye exhibits no discharge. No scleral icterus.  Neck: Trachea normal, normal range of motion, full passive range of motion without pain and phonation normal. Neck supple. No JVD present. Muscular tenderness present. No spinous process tenderness present. No neck rigidity. No tracheal deviation, no edema, no erythema and normal range of motion present. No Brudzinski's sign and no Kernig's sign  noted. No thyromegaly present.    Cardiovascular: Normal rate, regular rhythm, normal heart sounds and intact distal pulses.  Exam reveals no gallop and no friction rub.   No murmur heard. Pulmonary/Chest: Effort normal and breath sounds normal. No stridor. No respiratory distress. She has no wheezes. She has no rales. She exhibits no tenderness.  Abdominal: Soft. Bowel sounds are normal. She exhibits no distension and no mass. There is no tenderness. There is no rebound and no guarding.  Musculoskeletal: Normal range of motion. She exhibits no edema or tenderness.  Lymphadenopathy:    She has no cervical adenopathy.  Neurological: She is alert and oriented to person, place, and time. She has normal strength. She displays no tremor and normal reflexes. No cranial nerve deficit or sensory deficit. She exhibits normal muscle tone. Coordination and gait normal. GCS eye subscore is 4. GCS verbal subscore is 5. GCS motor subscore is 6.  Speech is clear and goal oriented, follows commands Major Cranial nerves without deficit, no facial droop Normal strength in upper and lower extremities bilaterally including dorsiflexion and plantar flexion, strong and equal grip strength Sensation normal to light touch Moves extremities without ataxia, coordination intact Normal finger to nose Neg romberg, no pronator drift Normal gait and balance   Skin: Skin is warm and dry. Capillary refill takes less than 2 seconds. No rash noted. She is not diaphoretic. No erythema. No pallor.  Psychiatric: She has a normal mood and affect. Her behavior is normal. Judgment and thought content normal.  Nursing note and vitals reviewed.    ED Treatments / Results  DIAGNOSTIC STUDIES:  Oxygen Saturation is 99% on RA, normal by my interpretation.    COORDINATION OF CARE:  7:17 PM Discussed treatment plan with pt at bedside which included imaging and pt agreed to plan.  Labs (all labs ordered are listed, but only  abnormal results are displayed) Labs Reviewed - No data to display  EKG  EKG Interpretation None       Radiology Ct Head Wo Contrast  Result Date: 11/28/2015 CLINICAL DATA:  50 y/o F; migraine as starting yesterday with dizziness and some blurred vision. EXAM: CT HEAD WITHOUT CONTRAST TECHNIQUE: Contiguous axial images were obtained from the base of the skull through the vertex without intravenous contrast. COMPARISON:  None. FINDINGS: Brain: No evidence of acute infarction, hemorrhage, hydrocephalus, extra-axial collection or mass lesion/mass effect. Vascular: No hyperdense vessel or unexpected calcification. Skull: Normal. Negative for fracture or focal lesion. Sinuses/Orbits: No acute finding. Other: None. IMPRESSION: Normal CT of the head. Electronically Signed   By: Mia Creek  [place]-Stratton M.D.   On: 11/28/2015 19:11    Procedures Procedures (including critical care time)  Medications Ordered in ED Medications  sodium chloride 0.9 % bolus 1,000 mL (0 mLs Intravenous Stopped 11/28/15 2031)  ketorolac (TORADOL) 30 MG/ML injection 30 mg (30 mg Intravenous Given 11/28/15 1850)  dexamethasone (DECADRON) injection 10 mg (10 mg Intravenous Given 11/28/15 1850)  prochlorperazine (COMPAZINE) injection 10 mg (10 mg Intravenous Given 11/28/15 1850)  diphenhydrAMINE (BENADRYL) injection 25 mg (25 mg Intravenous Given 11/28/15 1851)     Initial Impression / Assessment and Plan / ED Course  I have reviewed the triage vital signs and the nursing notes.  Pertinent labs & imaging results that were available during my care of the patient were reviewed by me and considered in my medical decision making (see chart for details).  Clinical Course    Patient presented to the emergency department with HA similar to her typical migraine headaches, however more severe with tinnitus, No resolution with home remedies.   Presentation is like pts typical HA, however she is concerned with worsening severity  and frequency, and request imaging.  Presentation non concerning for [facility], ICH, Meningitis, or temporal arteritis. No focal neurological deficits. Pt is afebrile with no focal neuro deficits, nuchal rigidity, or change in vision.   Recent visit to the ER pt required Benadryl, Toradol, steroids, Compazine and Benadryl and patient's headache improved. She is tolerating by mouth. She reports being ready for discharge. I encouraged her to follow-up with neurology for recheck.  Ambulatory referral entered, she is wishing to get a second opinion from [facility].  Discussed return precautions. I advised the patient to follow-up with their primary care provider this week. I advised the patient to return to the emergency department with new or worsening symptoms or new concerns. The patient verbalized understanding and agreement with plan.     Final Clinical Impressions(s) / ED Diagnoses   Final diagnoses:  Bad headache    New Prescriptions Discharge Medication List as of 11/28/2015  8:32 PM     I personally performed the services described in this documentation, which was scribed in my presence. The recorded information has been reviewed and is accurate.       Delsa Grana, PA-C 11/28/15 2042    Merrily Pew, MD 11/28/15 2308

## 2015-11-28 NOTE — ED Notes (Signed)
Patient transported to CT 

## 2015-11-28 NOTE — ED Triage Notes (Signed)
C/o migraine started yesterday-NAD-steady gait

## 2015-11-29 MED FILL — ZOMIG 5 MG NASAL SPRAY: 5 | 30 days supply | Qty: 6 | Fill #1

## 2015-12-07 MED FILL — ESOMEPRAZOLE MAG DR 40 MG C: 40 | 30 days supply | Qty: 30 | Fill #2

## 2015-12-11 DIAGNOSIS — R51 Headache: Secondary | ICD-10-CM | POA: Diagnosis not present

## 2015-12-11 DIAGNOSIS — K219 Gastro-esophageal reflux disease without esophagitis: Secondary | ICD-10-CM | POA: Diagnosis not present

## 2015-12-11 DIAGNOSIS — F329 Major depressive disorder, single episode, unspecified: Secondary | ICD-10-CM | POA: Diagnosis not present

## 2015-12-11 DIAGNOSIS — N898 Other specified noninflammatory disorders of vagina: Secondary | ICD-10-CM | POA: Diagnosis not present

## 2015-12-14 DIAGNOSIS — D235 Other benign neoplasm of skin of trunk: Secondary | ICD-10-CM | POA: Diagnosis not present

## 2015-12-14 DIAGNOSIS — L821 Other seborrheic keratosis: Secondary | ICD-10-CM | POA: Diagnosis not present

## 2015-12-14 DIAGNOSIS — D485 Neoplasm of uncertain behavior of skin: Secondary | ICD-10-CM | POA: Diagnosis not present

## 2015-12-18 ENCOUNTER — Ambulatory Visit: Payer: Self-pay | Admitting: Rheumatology

## 2015-12-19 MED FILL — tiZANidine HCL 4 MG TABS: 4 | 30 days supply | Qty: 90 | Fill #0

## 2015-12-19 MED FILL — TOPIRAMATE 25 MG TABLET: 25 | 45 days supply | Qty: 90 | Fill #0

## 2015-12-19 MED FILL — ESCITALOPRAM 5 MG TABLET: 5 | 90 days supply | Qty: 90 | Fill #0

## 2015-12-20 ENCOUNTER — Other Ambulatory Visit: Payer: Self-pay | Admitting: Physician Assistant

## 2015-12-20 ENCOUNTER — Telehealth: Payer: Self-pay | Admitting: Rheumatology

## 2015-12-20 DIAGNOSIS — Z1231 Encounter for screening mammogram for malignant neoplasm of breast: Secondary | ICD-10-CM

## 2015-12-20 NOTE — Telephone Encounter (Signed)
Patient called to reschedule her 6 month return office visit and instead of rescheduling the appointment, she wants to come in to have lab work done and based on the lab work see if she needs a follow up right now. She has not had any new symptoms and she is doing fine. She wants to see what Dr. Estanislado Pandy thinks about this.

## 2015-12-20 NOTE — Telephone Encounter (Signed)
I have advised her no need for her to follow up if she is well, she states she was treated for Vit D def. And she feels better since she has been treated. She will let us know if she has any further problems

## 2015-12-21 MED FILL — BACLOFEN 10 MG TABLET: 10 | 30 days supply | Qty: 90 | Fill #0

## 2015-12-25 MED FILL — VERAPAMIL ER 120 MG CAPSULE: 120 | 30 days supply | Qty: 60 | Fill #4

## 2015-12-25 MED FILL — raNITIdine HCL 150 MG TABS: 150 | 30 days supply | Qty: 60 | Fill #2

## 2016-01-05 MED FILL — DICLOFENAC SOD EC 50 MG TAB: 50 | 15 days supply | Qty: 30 | Fill #0

## 2016-01-08 MED FILL — ESOMEPRAZOLE MAG DR 40 MG C: 40 | 30 days supply | Qty: 30 | Fill #3

## 2016-01-09 MED FILL — TOPIRAMATE 100 MG TABLET: 100 | 90 days supply | Qty: 90 | Fill #0

## 2016-01-11 ENCOUNTER — Ambulatory Visit: Payer: 59

## 2016-01-22 ENCOUNTER — Telehealth: Payer: 59 | Admitting: Nurse Practitioner

## 2016-01-22 DIAGNOSIS — J019 Acute sinusitis, unspecified: Secondary | ICD-10-CM

## 2016-01-22 MED ORDER — AMOXICILLIN-POT CLAVULANATE 875-125 MG PO TABS: 1.0000 | ORAL_TABLET | Freq: Two times a day (BID) | ORAL | 0 refills | Status: DC

## 2016-01-22 MED ORDER — FLUTICASONE PROPIONATE 50 MCG/ACT NA SUSP: 2.0000 | Freq: Every day | NASAL | 0 refills | Status: DC

## 2016-01-22 MED FILL — AMOX-CLAV 875-125 MG TABLET: 875-125 | 10 days supply | Qty: 20 | Fill #0

## 2016-01-22 MED FILL — raNITIdine HCL 150 MG TABS: 150 | 30 days supply | Qty: 60 | Fill #3

## 2016-01-22 MED FILL — VERAPAMIL ER 120 MG CAPSULE: 120 | 30 days supply | Qty: 60 | Fill #5

## 2016-01-22 MED FILL — FLUTICASONE PROP 50 MCG SPR: 50 | 30 days supply | Qty: 16 | Fill #0

## 2016-01-22 NOTE — Progress Notes (Signed)
We are sorry that you are not feeling well.  Here is how we plan to help!  Based on what you have shared with me it looks like you have sinusitis.  Sinusitis is inflammation and infection in the sinus cavities of the head.  Based on your presentation I believe you most likely have Acute Viral Sinusitis.This is an infection most likely caused by a virus. There is not specific treatment for viral sinusitis other than to help you with the symptoms until the infection runs its course.  You may use an oral decongestant such as Mucinex D or if you have glaucoma or high blood pressure use plain Mucinex. Saline nasal spray help and can safely be used as often as needed for congestion, I have prescribed: Fluticasone nasal spray two sprays in each nostril twice a day.  In addition, I am also prescribing Amoxicillin/Clauvulanic Acid 875/125mg  twice daily for 10 days based on the duration of your symptoms and no improvement from over-the-counter medications.   Some authorities believe that zinc sprays or the use of Echinacea may shorten the course of your symptoms.  Sinus infections are not as easily transmitted as other respiratory infection, however we still recommend that you avoid close contact with loved ones, especially the very young and elderly.  Remember to wash your hands thoroughly throughout the day as this is the number one way to prevent the spread of infection!  Home Care:  Only take medications as instructed by your medical team.  Complete the entire course of an antibiotic.  Do not take these medications with alcohol.  A steam or ultrasonic humidifier can help congestion.  You can place a towel over your head and breathe in the steam from hot water coming from a faucet.  Avoid close contacts especially the very young and the elderly.  Cover your mouth when you cough or sneeze.  Always remember to wash your hands.  Get Help Right Away If:  You develop worsening fever or sinus  pain.  You develop a severe head ache or visual changes.  Your symptoms persist after you have completed your treatment plan.  Make sure you  Understand these instructions.  Will watch your condition.  Will get help right away if you are not doing well or get worse.  Your e-visit answers were reviewed by a board certified advanced clinical practitioner to complete your personal care plan.  Depending on the condition, your plan could have included both over the counter or prescription medications.  If there is a problem please reply  once you have received a response from your provider.  Your safety is important to [place].  If you have drug allergies check your prescription carefully.    You can use MyChart to ask questions about today's visit, request a non-urgent call back, or ask for a work or school excuse for 24 hours related to this e-Visit. If it has been greater than 24 hours you will need to follow up with your provider, or enter a new e-Visit to address those concerns.  You will get an e-mail in the next two days asking about your experience.  I hope that your e-visit has been valuable and will speed your recovery. Thank you for using e-visits.

## 2016-01-29 MED FILL — ZOMIG 5 MG NASAL SPRAY: 5 | 30 days supply | Qty: 6 | Fill #0

## 2016-01-29 MED FILL — ESOMEPRAZOLE MAG DR 40 MG C: 40 | 30 days supply | Qty: 30 | Fill #0

## 2016-01-29 MED FILL — PREMARIN 0.625 MG TABLET: 0.625 | 90 days supply | Qty: 90 | Fill #0

## 2016-01-30 MED FILL — SCOPOLAMINE 1 MG/3 DAY PATC: 1 | 15 days supply | Qty: 5 | Fill #0

## 2016-02-15 MED FILL — ESCITALOPRAM 10 MG TABLET: 10 | 90 days supply | Qty: 90 | Fill #0

## 2016-02-15 MED FILL — TRIAMCINOLONE 0.1% CREAM: 0.1 | 15 days supply | Qty: 60 | Fill #0

## 2016-02-20 ENCOUNTER — Ambulatory Visit
Admission: RE | Admit: 2016-02-20 | Discharge: 2016-02-20 | Disposition: A | Discharge status: Home / Self Care | Payer: 59 | Source: Ambulatory Visit | Attending: Physician Assistant | Admitting: Physician Assistant

## 2016-02-20 DIAGNOSIS — Z1231 Encounter for screening mammogram for malignant neoplasm of breast: Secondary | ICD-10-CM

## 2016-02-22 MED FILL — raNITIdine HCL 150 MG TABS: 150 | 30 days supply | Qty: 60 | Fill #1

## 2016-02-23 MED FILL — VERAPAMIL ER 120 MG CAPSULE: 120 | 90 days supply | Qty: 180 | Fill #0

## 2016-03-07 MED FILL — ESOMEPRAZOLE MAG DR 40 MG C: 40 | 30 days supply | Qty: 30 | Fill #1

## 2016-03-21 ENCOUNTER — Telehealth: Payer: 59 | Admitting: Family

## 2016-03-21 DIAGNOSIS — B9689 Other specified bacterial agents as the cause of diseases classified elsewhere: Secondary | ICD-10-CM

## 2016-03-21 DIAGNOSIS — J028 Acute pharyngitis due to other specified organisms: Secondary | ICD-10-CM | POA: Diagnosis not present

## 2016-03-21 MED ORDER — BENZONATATE 100 MG PO CAPS: 100.0000 mg | ORAL_CAPSULE | Freq: Three times a day (TID) | ORAL | 0 refills | Status: DC | PRN

## 2016-03-21 MED ORDER — PREDNISONE 5 MG PO TABS: 5.0000 mg | ORAL_TABLET | ORAL | 0 refills | Status: DC

## 2016-03-21 MED ORDER — DOXYCYCLINE HYCLATE 100 MG PO TABS: 100.0000 mg | ORAL_TABLET | Freq: Two times a day (BID) | ORAL | 0 refills | Status: DC

## 2016-03-21 MED FILL — DOXYCYCLINE HYCLATE 100 MG: 100 | 7 days supply | Qty: 14 | Fill #0

## 2016-03-21 MED FILL — BENZONATATE 100 MG CAPSULE: 100 | 5 days supply | Qty: 30 | Fill #0

## 2016-03-21 MED FILL — predniSONE 5 MG TABS: 5 | 6 days supply | Qty: 21 | Fill #0

## 2016-03-21 NOTE — Progress Notes (Signed)
We are sorry that you are not feeling well.  Here is how we plan to help!  Based on what you have shared with me it looks like you have upper respiratory tract inflammation that has resulted in a significant cough.  Inflammation and infection in the upper respiratory tract is commonly called bronchitis and has four common causes:  Allergies, Viral Infections, Acid Reflux and Bacterial Infections.  Allergies, viruses and acid reflux are treated by controlling symptoms or eliminating the cause. An example might be a cough caused by taking certain blood pressure medications. You stop the cough by changing the medication. Another example might be a cough caused by acid reflux. Controlling the reflux helps control the cough.  Based on your presentation I believe you most likely have A cough due to bacteria.  When patients have a fever and a productive cough with a change in color or increased sputum production, we are concerned about bacterial bronchitis.  If left untreated it can progress to pneumonia.  If your symptoms do not improve with your treatment plan it is important that you contact your provider.   I hve prescribed Doxycycline 100 mg twice a day for 7 days     In addition you may use A non-prescription cough medication called Mucinex DM: take 2 tablets every 12 hours. and A prescription cough medication called Tessalon Perles 100mg . You may take 1-2 capsules every 8 hours as needed for your cough.  Sterapred 5 mg dosepak   *Also, please stop using the Neti pot. This has been proven to cause and worsen sinus infections. The water flow irritates the tissue causing microtrauma, allowing bacteria to infect. Use saline nasal spray to loosen secretions instead.   USE OF BRONCHODILATOR ("RESCUE") INHALERS: There is a risk from using your bronchodilator too frequently.  The risk is that over-reliance on a medication which only relaxes the muscles surrounding the breathing tubes can reduce the  effectiveness of medications prescribed to reduce swelling and congestion of the tubes themselves.  Although you feel brief relief from the bronchodilator inhaler, your asthma may actually be worsening with the tubes becoming more swollen and filled with mucus.  This can delay other crucial treatments, such as oral steroid medications. If you need to use a bronchodilator inhaler daily, several times per day, you should discuss this with your provider.  There are probably better treatments that could be used to keep your asthma under control.     HOME CARE . Only take medications as instructed by your medical team. . Complete the entire course of an antibiotic. . Drink plenty of fluids and get plenty of rest. . Avoid close contacts especially the very young and the elderly . Cover your mouth if you cough or cough into your sleeve. . Always remember to wash your hands . A steam or ultrasonic humidifier can help congestion.   GET HELP RIGHT AWAY IF: . You develop worsening fever. . You become short of breath . You cough up blood. . Your symptoms persist after you have completed your treatment plan MAKE SURE YOU   Understand these instructions.  Will watch your condition.  Will get help right away if you are not doing well or get worse.  Your e-visit answers were reviewed by a board certified advanced clinical practitioner to complete your personal care plan.  Depending on the condition, your plan could have included both over the counter or prescription medications. If there is a problem please reply  once you  have received a response from your provider. Your safety is important to [place].  If you have drug allergies check your prescription carefully.    You can use MyChart to ask questions about today's visit, request a non-urgent call back, or ask for a work or school excuse for 24 hours related to this e-Visit. If it has been greater than 24 hours you will need to follow up with your  provider, or enter a new e-Visit to address those concerns. You will get an e-mail in the next two days asking about your experience.  I hope that your e-visit has been valuable and will speed your recovery. Thank you for using e-visits.

## 2016-03-22 ENCOUNTER — Telehealth: Payer: 59 | Admitting: Family

## 2016-03-22 DIAGNOSIS — J029 Acute pharyngitis, unspecified: Secondary | ICD-10-CM

## 2016-03-22 MED ORDER — ALBUTEROL SULFATE HFA 108 (90 BASE) MCG/ACT IN AERS: 1.0000 | INHALATION_SPRAY | Freq: Four times a day (QID) | RESPIRATORY_TRACT | 1 refills | Status: DC | PRN

## 2016-03-22 MED FILL — VENTOLIN HFA 90 MCG INHALER: 108 (90 BAS | 25 days supply | Qty: 18 | Fill #0

## 2016-03-22 NOTE — Progress Notes (Signed)
Based on what you shared with me it looks like you have a serious condition that should be evaluated in a face to face office visit.  NOTE: Even if you have entered your credit card information for this eVisit, you will not be charged.   If you are having a true medical emergency please call 911.  If you need an urgent face to face visit, [facility] has four urgent care centers for your convenience.  If you need care fast and have a high deductible or no insurance consider:   [email or web address]  [phone]  [address], [address] [place], [place] [address] 8 am to 8 pm Monday-Friday 10 am to 4 pm Saturday-Sunday   The following sites will take your  insurance:    . [facility]  [phone] Get Driving Directions Find a Provider at this Location  [address] [place], [place] [address] . 10 am to 8 pm Monday-Friday . 12 pm to 8 pm Saturday-Sunday   . [facility]  [phone] Get Driving Directions Find a Provider at this Location  [address], [address] [place], [place] [address] . 8 am to 8 pm Monday-Friday . 9 am to 6 pm Saturday . 11 am to 6 pm Sunday   . [facility] [place]  [phone] Get Driving Directions  [address] [place], [place] [address] . 8 am to 8 pm Monday-Friday . 8 am to 4 pm Saturday-Sunday   Your e-visit answers were reviewed by a board certified advanced clinical practitioner to complete your personal care plan.  Thank you for using e-Visits.  

## 2016-03-25 MED FILL — raNITIdine HCL 150 MG TABS: 150 | 30 days supply | Qty: 60 | Fill #2

## 2016-03-25 MED FILL — AMOXICILLIN 500 MG CAPSULE: 500 | 10 days supply | Qty: 30 | Fill #0

## 2016-03-25 MED FILL — HYDROCODON-APAP 5-325: 5-325 | 7 days supply | Qty: 15 | Fill #0

## 2016-04-02 MED FILL — ESOMEPRAZOLE MAG DR 40 MG C: 40 | 30 days supply | Qty: 30 | Fill #2

## 2016-04-11 MED FILL — ZOMIG 5 MG NASAL SPRAY: 5 | 30 days supply | Qty: 6 | Fill #1

## 2016-04-11 MED FILL — TOPIRAMATE 25 MG TABLET: 25 | 90 days supply | Qty: 90 | Fill #0

## 2016-04-11 MED FILL — TOPIRAMATE 100 MG TABLET: 100 | 90 days supply | Qty: 90 | Fill #1

## 2016-04-11 MED FILL — ONDANSETRON HCL 8 MG TABLET: 8 | 3 days supply | Qty: 20 | Fill #0

## 2016-04-11 MED FILL — FLUCONAZOLE 150 MG TABLET: 150 | 1 days supply | Qty: 1 | Fill #0

## 2016-04-18 MED FILL — CELECOXIB 200 MG CAPSULE: 200 | 30 days supply | Qty: 30 | Fill #0

## 2016-04-24 MED FILL — raNITIdine HCL 150 MG TABS: 150 | 30 days supply | Qty: 60 | Fill #0

## 2016-05-02 DIAGNOSIS — R51 Headache: Secondary | ICD-10-CM | POA: Diagnosis not present

## 2016-05-02 DIAGNOSIS — F329 Major depressive disorder, single episode, unspecified: Secondary | ICD-10-CM | POA: Diagnosis not present

## 2016-05-03 MED FILL — ESCITALOPRAM 10 MG TABLET: 10 | 90 days supply | Qty: 90 | Fill #0

## 2016-05-03 MED FILL — PREMARIN 0.625 MG TABLET: 0.625 | 90 days supply | Qty: 90 | Fill #0

## 2016-05-03 MED FILL — ESOMEPRAZOLE MAG DR 40 MG C: 40 | 90 days supply | Qty: 90 | Fill #0

## 2016-05-27 DIAGNOSIS — H40013 Open angle with borderline findings, low risk, bilateral: Secondary | ICD-10-CM | POA: Diagnosis not present

## 2016-05-27 DIAGNOSIS — D3132 Benign neoplasm of left choroid: Secondary | ICD-10-CM | POA: Diagnosis not present

## 2016-05-27 MED FILL — raNITIdine HCL 150 MG TABS: 150 | 30 days supply | Qty: 60 | Fill #1

## 2016-05-27 MED FILL — VERAPAMIL ER 120 MG TABLET: 120 | 30 days supply | Qty: 60 | Fill #0

## 2016-05-27 MED FILL — TRIAMCINOLONE 0.1% CREAM: 0.1 | 20 days supply | Qty: 60 | Fill #0

## 2016-06-08 ENCOUNTER — Encounter (HOSPITAL_BASED_OUTPATIENT_CLINIC_OR_DEPARTMENT_OTHER): Payer: Self-pay | Admitting: *Deleted

## 2016-06-08 ENCOUNTER — Emergency Department (HOSPITAL_BASED_OUTPATIENT_CLINIC_OR_DEPARTMENT_OTHER)
Admission: EM | Admit: 2016-06-08 | Discharge: 2016-06-08 | Disposition: A | Discharge status: Home / Self Care | Payer: 59 | Attending: Emergency Medicine | Admitting: Emergency Medicine

## 2016-06-08 DIAGNOSIS — R110 Nausea: Secondary | ICD-10-CM

## 2016-06-08 DIAGNOSIS — G43109 Migraine with aura, not intractable, without status migrainosus: Secondary | ICD-10-CM | POA: Insufficient documentation

## 2016-06-08 DIAGNOSIS — Z87891 Personal history of nicotine dependence: Secondary | ICD-10-CM | POA: Diagnosis not present

## 2016-06-08 DIAGNOSIS — Z79899 Other long term (current) drug therapy: Secondary | ICD-10-CM | POA: Diagnosis not present

## 2016-06-08 MED ORDER — DEXAMETHASONE SODIUM PHOSPHATE 10 MG/ML IJ SOLN
10.0000 mg | Freq: Once | INTRAMUSCULAR | Status: AC
  Administered 2016-06-08: 10 mg via INTRAVENOUS
  Filled 2016-06-08: qty 1

## 2016-06-08 MED ORDER — ONDANSETRON HCL 4 MG/2ML IJ SOLN
4.0000 mg | Freq: Once | INTRAMUSCULAR | Status: AC
  Administered 2016-06-08: 4 mg via INTRAVENOUS
  Filled 2016-06-08: qty 2

## 2016-06-08 MED ORDER — KETOROLAC TROMETHAMINE 30 MG/ML IJ SOLN
30.0000 mg | Freq: Once | INTRAMUSCULAR | Status: AC
  Administered 2016-06-08: 30 mg via INTRAVENOUS
  Filled 2016-06-08: qty 1

## 2016-06-08 MED ORDER — SODIUM CHLORIDE 0.9 % IV BOLUS (SEPSIS)
1000.0000 mL | Freq: Once | INTRAVENOUS | Status: AC
  Administered 2016-06-08: 1000 mL via INTRAVENOUS

## 2016-06-08 MED ORDER — PROCHLORPERAZINE EDISYLATE 5 MG/ML IJ SOLN
10.0000 mg | Freq: Once | INTRAMUSCULAR | Status: AC
  Administered 2016-06-08: 10 mg via INTRAVENOUS
  Filled 2016-06-08: qty 2

## 2016-06-08 MED ORDER — PROCHLORPERAZINE MALEATE 10 MG PO TABS: 10.0000 mg | ORAL_TABLET | Freq: Two times a day (BID) | ORAL | 0 refills | Status: DC | PRN

## 2016-06-08 MED ORDER — DIPHENHYDRAMINE HCL 50 MG/ML IJ SOLN
25.0000 mg | Freq: Once | INTRAMUSCULAR | Status: AC
  Administered 2016-06-08: 25 mg via INTRAVENOUS
  Filled 2016-06-08: qty 1

## 2016-06-08 NOTE — ED Provider Notes (Signed)
[facility] DEPT [facility] Provider Note   CSN: [id number] Arrival date & time: 06/08/16  1253     History   Chief Complaint Chief Complaint  Patient presents with  . Migraine    HPI Noga Fogg is a 51 y.o. female with a PMHx of migraines, chronic sinusitis, GERD, and arthritis, who presents to the ED with complaints of migraine 1 day. Patient states that this migraine is the same as her prior migraines, reports that she's had an increase in migraine frequency this month and has an appointment with her PCP on Tuesday (in 3 days) for evaluation and management of this increase in migraine frequency. Patient describes the pain as 9/10 constant throbbing nonradiating right lateral headache worse with lights and sounds and unrelieved with intranasal Zomid, baclofen, and diclofenac which she took yesterday; only medication today was diclofenac (and her usual home meds) but this also didn't help. She reports associated nausea as well. Of note, chart review reveals she's been seen in the ED multiple times for headaches in the past, most recent visit was 11/28/15 during which she had a head CT which was unremarkable, headache improved with migraine cocktail of: 1L IVFs, 30mg  IV toradol, 10mg  IV decadron, 10mg  IV compazine, and 25mg  IV benadryl. This same cocktail has been given to her during other ED visits as well. She states this combination always seems to help.   She denies fevers, chills, congestion, vision changes, lightheadedness, syncope, neck stiffness, CP, SOB, abd pain, V/D/C, hematuria, dysuria, myalgias, arthralgias, numbness, tingling, focal weakness, or any other complaints at this time. Denies recent head injury.    The history is provided by the patient and medical records. No language interpreter was used.  Migraine  This is a chronic problem. The current episode started yesterday. The problem occurs constantly. The problem has not changed since onset.Associated symptoms include  headaches. Pertinent negatives include no chest pain, no abdominal pain and no shortness of breath. Exacerbated by: lights and sounds. Nothing relieves the symptoms. Treatments tried: intranasal zomid, baclofen, and diclofenac. The treatment provided no relief.    Past Medical History:  Diagnosis Date  . Arthritis    OA in hands, hip and feet  . Chronic sinusitis   . Complication of anesthesia   . Depression   . GERD (gastroesophageal reflux disease)   . Joint pain   . Migraine   . PONV (postoperative nausea and vomiting)     Patient Active Problem List   Diagnosis Date Noted  . Peroneal tendon tear 07/20/2015  . Left ankle pain 08/03/2010  . Left shoulder pain 06/29/2010    Past Surgical History:  Procedure Laterality Date  . ABDOMINAL HYSTERECTOMY    . ANKLE RECONSTRUCTION Right 07/20/2015   Procedure: OPEN EXPLORATION OF LATERAL RIGHT PERONEUS BREVIS TENDON WITH REPAIR ;  Surgeon: Garald Balding, MD;  Location: [facility];  Service: Orthopedics;  Laterality: Right;  . CESAREAN SECTION     x 2  . CHOLECYSTECTOMY  2012   lap choli  . MASS EXCISION  06/03/2011   Procedure: EXCISION MASS;  Surgeon: Schuyler Amor, MD;  Location: [facility];  Service: Orthopedics;  Laterality: Right;  EXCISION MASS RIGHT MIDDLE FINGER   . NASAL SINUS SURGERY  2007, 2010    OB History    No data available       Home Medications    Prior to Admission medications   Medication Sig Start Date End Date Taking? Authorizing Provider  ranitidine ([name]) 150 MG capsule Take 150 mg by mouth 2 (two) times daily.   Yes Historical Provider, MD  calcium-[name] 250-100 MG-UNIT tablet Take 1 tablet by mouth 2 (two) times daily.    Historical Provider, MD  escitalopram (LEXAPRO) 5 MG tablet Take 5 mg by mouth daily.    Historical Provider, MD  esomeprazole ([name]) 40 MG capsule Take 40 mg by mouth daily at 12 noon.    Historical Provider, MD  estrogens,  conjugated, ([name]) 0.625 MG tablet Take 0.625 mg by mouth daily.      Historical Provider, MD  magnesium hydroxide (MILK OF MAGNESIA) 400 MG/5ML suspension Take by mouth daily as needed for mild constipation.    Historical Provider, MD  TIZANIDINE HCL PO Take by mouth.    Historical Provider, MD  topiramate ([name]) 100 MG tablet Take 100 mg by mouth 2 (two) times daily.    Historical Provider, MD  verapamil (CALAN-SR) 120 MG CR tablet Take 120 mg by mouth 2 (two) times daily.    Historical Provider, MD  zolmitriptan ([name]) 5 MG nasal solution Place into the nose as needed for migraine.    Historical Provider, MD    Family History Family History  Problem Relation Age of Onset  . Diabetes Mother   . Diabetes Father   . Heart attack Father   . Hypertension Father   . Diabetes Maternal Grandfather   . Heart attack Maternal Grandfather   . Hypertension Maternal Grandfather   . Diabetes Paternal Grandfather   . Heart attack Paternal Grandfather   . Hypertension Paternal Grandfather     Social History Social History  Substance Use Topics  . Smoking status: Former Smoker    Quit date: 05/30/1989  . Smokeless tobacco: Never Used  . Alcohol use No     Allergies   Aspirin; Ibuprofen; Metoclopramide hcl; Triptans; and Zithromax [azithromycin dihydrate]   Review of Systems Review of Systems  Constitutional: Negative for chills and fever.  HENT: Negative for congestion.   Eyes: Positive for photophobia. Negative for visual disturbance.  Respiratory: Negative for shortness of breath.   Cardiovascular: Negative for chest pain.  Gastrointestinal: Negative for abdominal pain, constipation, diarrhea, nausea and vomiting.  Genitourinary: Negative for dysuria and hematuria.  Musculoskeletal: Negative for arthralgias, myalgias and neck stiffness.  Skin: Negative for color change.  Allergic/Immunologic: Negative for immunocompromised state.  Neurological: Positive for headaches.  Negative for syncope, weakness, light-headedness and numbness.  Psychiatric/Behavioral: Negative for confusion.   10 Systems reviewed and are negative for acute change except as noted in the HPI.   Physical Exam Updated Vital Signs BP 100/62 (BP Location: Right Arm)   Pulse 67   Temp 98.5 F (36.9 C) (Oral)   Resp 16   Ht 5\' 5"  (1.651 m)   Wt 95.3 kg   SpO2 99%   BMI 34.95 kg/m    Physical Exam  Constitutional: She is oriented to person, place, and time. Vital signs are normal. She appears well-developed and well-nourished.  Non-toxic appearance. No distress.  Afebrile, nontoxic, NAD  HENT:  Head: Normocephalic and atraumatic.  Mouth/Throat: Oropharynx is clear and moist and mucous membranes are normal.  Eyes: Conjunctivae and EOM are normal. Pupils are equal, round, and reactive to light. Right eye exhibits no discharge. Left eye exhibits no discharge.  PERRL, EOMI, no nystagmus, no visual field deficits   Neck: Normal range of motion. Neck supple. No spinous process tenderness and no muscular tenderness present. No neck rigidity.  Normal range of motion present.  FROM intact without spinous process TTP, no bony stepoffs or deformities, no paraspinous muscle TTP or muscle spasms. No rigidity or meningeal signs. No bruising or swelling.   Cardiovascular: Normal rate, regular rhythm, normal heart sounds and intact distal pulses.  Exam reveals no gallop and no friction rub.   No murmur heard. Pulmonary/Chest: Effort normal and breath sounds normal. No respiratory distress. She has no decreased breath sounds. She has no wheezes. She has no rhonchi. She has no rales.  Abdominal: Soft. Normal appearance and bowel sounds are normal. She exhibits no distension. There is no tenderness. There is no rigidity, no rebound, no guarding, no CVA tenderness, no tenderness at McBurney's point and negative Murphy's sign.  Musculoskeletal: Normal range of motion.  MAE x4 Strength and sensation grossly  intact in all extremities Distal pulses intact Gait steady  Neurological: She is alert and oriented to person, place, and time. She has normal strength. No cranial nerve deficit or sensory deficit. Coordination and gait normal. GCS eye subscore is 4. GCS verbal subscore is 5. GCS motor subscore is 6.  CN 2-12 grossly intact A&O x4 GCS 15 Sensation and strength intact Gait nonataxic including with tandem walking Coordination with finger-to-nose WNL Neg pronator drift   Skin: Skin is warm, dry and intact. No rash noted.  Psychiatric: She has a normal mood and affect.  Nursing note and vitals reviewed.    ED Treatments / Results  Labs (all labs ordered are listed, but only abnormal results are displayed) Labs Reviewed - No data to display  EKG  EKG Interpretation None       Radiology No results found.  Procedures Procedures (including critical care time)  Medications Ordered in ED Medications  diphenhydrAMINE (BENADRYL) injection 25 mg (25 mg Intravenous Given 06/08/16 1555)    And  sodium chloride 0.9 % bolus 1,000 mL (1,000 mLs Intravenous New Bag/Given 06/08/16 1554)    And  ketorolac (TORADOL) 30 MG/ML injection 30 mg (30 mg Intravenous Given 06/08/16 1554)  ondansetron (ZOFRAN) injection 4 mg (4 mg Intravenous Given 06/08/16 1501)  prochlorperazine (COMPAZINE) injection 10 mg (10 mg Intravenous Given 06/08/16 1555)  dexamethasone (DECADRON) injection 10 mg (10 mg Intravenous Given 06/08/16 1554)     Initial Impression / Assessment and Plan / ED Course  I have reviewed the triage vital signs and the nursing notes.  Pertinent labs & imaging results that were available during my care of the patient were reviewed by me and considered in my medical decision making (see chart for details).     51 y.o. female here with migraine x1 day, hx of migraines, states it feels the same. Recent increase in migraine frequency, has appt with PCP in 3 days. On exam, no focal neuro  deficits. Has previously had relief from migraine cocktail consisting of IV benadryl, compazine, fluids, toradol, and decadron; will give this now. Doubt need for imaging or labs. Will reassess after meds given.   4:43 PM Pt feeling improved, ready to go home. Advised use of home meds, in addition to tylenol for additional relief of symptoms. Will rx compazine to help with nausea/headaches. F/up with her PCP in 3 days for her already scheduled visit, and for ongoing management of her migraines. I explained the diagnosis and have given explicit precautions to return to the ER including for any other new or worsening symptoms. The patient understands and accepts the medical plan as it's been dictated and I have answered  their questions. Discharge instructions concerning home care and prescriptions have been given. The patient is STABLE and is discharged to home in good condition.    Final Clinical Impressions(s) / ED Diagnoses   Final diagnoses:  Migraine with aura and without status migrainosus, not intractable  Nausea    New Prescriptions New Prescriptions   PROCHLORPERAZINE (COMPAZINE) 10 MG TABLET    Take 1 tablet (10 mg total) by mouth 2 (two) times daily as needed for nausea or vomiting (or headaches).     [address] Rodricus Candelaria, PA-C 06/08/16 [place], MD 06/09/16 [phone]

## 2016-06-08 NOTE — ED Triage Notes (Signed)
Pt reports migraine since yesterday.  Hx of same.  Reports that it feels the same as her 'normal' migraines.

## 2016-06-08 NOTE — Discharge Instructions (Signed)
Use your usual home medications, including your headache medications as directed by your doctor. Use additional tylenol as needed for additional relief. Use compazine as needed for headaches and/or nausea. Stay well hydrated. Get plenty of rest. Follow up with your regular doctor on Tuesday for your already scheduled appointment, and for recheck of symptoms and ongoing management of your headaches. Return to the ER for changes or worsening symptoms.

## 2016-06-11 DIAGNOSIS — F329 Major depressive disorder, single episode, unspecified: Secondary | ICD-10-CM | POA: Diagnosis not present

## 2016-06-11 DIAGNOSIS — K219 Gastro-esophageal reflux disease without esophagitis: Secondary | ICD-10-CM | POA: Diagnosis not present

## 2016-06-11 DIAGNOSIS — Z0000 Encounter for general adult medical examination without abnormal findings: Secondary | ICD-10-CM | POA: Diagnosis not present

## 2016-06-11 DIAGNOSIS — R51 Headache: Secondary | ICD-10-CM | POA: Diagnosis not present

## 2016-06-19 ENCOUNTER — Encounter (HOSPITAL_BASED_OUTPATIENT_CLINIC_OR_DEPARTMENT_OTHER): Payer: Self-pay

## 2016-06-19 ENCOUNTER — Emergency Department (HOSPITAL_BASED_OUTPATIENT_CLINIC_OR_DEPARTMENT_OTHER)
Admission: EM | Admit: 2016-06-19 | Discharge: 2016-06-19 | Disposition: A | Discharge status: Home / Self Care | Payer: 59 | Attending: Emergency Medicine | Admitting: Emergency Medicine

## 2016-06-19 DIAGNOSIS — Z87891 Personal history of nicotine dependence: Secondary | ICD-10-CM | POA: Insufficient documentation

## 2016-06-19 DIAGNOSIS — G43109 Migraine with aura, not intractable, without status migrainosus: Secondary | ICD-10-CM | POA: Insufficient documentation

## 2016-06-19 DIAGNOSIS — G43909 Migraine, unspecified, not intractable, without status migrainosus: Secondary | ICD-10-CM | POA: Diagnosis present

## 2016-06-19 MED ORDER — DIPHENHYDRAMINE HCL 50 MG/ML IJ SOLN
50.0000 mg | Freq: Once | INTRAMUSCULAR | Status: AC
  Administered 2016-06-19: 50 mg via INTRAVENOUS
  Filled 2016-06-19: qty 1

## 2016-06-19 MED ORDER — ONDANSETRON HCL 4 MG/2ML IJ SOLN
4.0000 mg | Freq: Once | INTRAMUSCULAR | Status: AC
  Administered 2016-06-19: 4 mg via INTRAVENOUS
  Filled 2016-06-19: qty 2

## 2016-06-19 MED ORDER — METHYLPREDNISOLONE SODIUM SUCC 125 MG IJ SOLR
125.0000 mg | Freq: Once | INTRAMUSCULAR | Status: AC
  Administered 2016-06-19: 125 mg via INTRAVENOUS
  Filled 2016-06-19: qty 2

## 2016-06-19 MED ORDER — SODIUM CHLORIDE 0.9 % IV BOLUS (SEPSIS)
1000.0000 mL | Freq: Once | INTRAVENOUS | Status: AC
  Administered 2016-06-19: 1000 mL via INTRAVENOUS

## 2016-06-19 MED ORDER — KETOROLAC TROMETHAMINE 30 MG/ML IJ SOLN
30.0000 mg | Freq: Once | INTRAMUSCULAR | Status: AC
  Administered 2016-06-19: 30 mg via INTRAVENOUS
  Filled 2016-06-19: qty 1

## 2016-06-19 NOTE — Discharge Instructions (Signed)

## 2016-06-19 NOTE — ED Provider Notes (Signed)
Emergency Department Provider Note   I have reviewed the triage vital signs and the nursing notes.   HISTORY  Chief Complaint Headache   HPI Jody Blackwell is a 51 y.o. female with PMH of GERD, migraine HA, and arthritis presents to the emergency department for evaluation of migraine headache. The patient has a 20+ year history of migraines and states that they all presented in a similar way. She usually has a vision disturbance followed by severe, throbbing pain in her right occipital region. No radiation of symptoms. This morning at 4 AM she awoke with typical right posterior headache. She took Tylenol and with her home medications and her headache resolved slightly. She was driving to work and noticed some blurry vision and so turned around to go home. Once arriving home she had return of her right sided migraine headache. She denies any new or different quality to her pain. No weakness or numbness. She called her primary care physician for an appointment but was unable to be seen until 3 PM today so presented to the emergency department.   Past Medical History:  Diagnosis Date  . Arthritis    OA in hands, hip and feet  . Chronic sinusitis   . Complication of anesthesia   . Depression   . GERD (gastroesophageal reflux disease)   . Joint pain   . Migraine   . PONV (postoperative nausea and vomiting)     Patient Active Problem List   Diagnosis Date Noted  . Peroneal tendon tear 07/20/2015  . Left ankle pain 08/03/2010  . Left shoulder pain 06/29/2010    Past Surgical History:  Procedure Laterality Date  . ABDOMINAL HYSTERECTOMY    . ANKLE RECONSTRUCTION Right 07/20/2015   Procedure: OPEN EXPLORATION OF LATERAL RIGHT PERONEUS BREVIS TENDON WITH REPAIR ;  Surgeon: Garald Balding, MD;  Location: [facility];  Service: Orthopedics;  Laterality: Right;  . CESAREAN SECTION     x 2  . CHOLECYSTECTOMY  2012   lap choli  . MASS EXCISION  06/03/2011   Procedure: EXCISION MASS;  Surgeon: Schuyler Amor, MD;  Location: [facility];  Service: Orthopedics;  Laterality: Right;  EXCISION MASS RIGHT MIDDLE FINGER   . NASAL SINUS SURGERY  2007, 2010    Current Outpatient Rx  . Order #: [id number] Class: Historical Med  . Order #: [id number] Class: Historical Med  . Order #: [id number] Class: Historical Med  . Order #: [id number] Class: Historical Med  . Order #: [id number] Class: Print  . Order #: [id number] Class: Historical Med  . Order #: [id number] Class: Historical Med  . Order #: [id number] Class: Historical Med  . Order #: [id number] Class: Historical Med  . Order #: [id number] Class: Historical Med  . Order #: [id number] Class: Historical Med  . Order #: [id number] Class: Historical Med    Allergies Aspirin; Ibuprofen; Metoclopramide hcl; Sulfa antibiotics; Triptans; and Zithromax [azithromycin dihydrate]  Family History  Problem Relation Age of Onset  . Diabetes Mother   . Diabetes Father   . Heart attack Father   . Hypertension Father   . Diabetes Maternal Grandfather   . Heart attack Maternal Grandfather   . Hypertension Maternal Grandfather   . Diabetes Paternal Grandfather   . Heart attack Paternal Grandfather   . Hypertension Paternal Grandfather     Social History Social History  Substance Use Topics  . Smoking status: Former Smoker    Quit date: 05/30/1989  . Smokeless tobacco: Never Used  . Alcohol use No  Review of Systems  Constitutional: No fever/chills Eyes: No visual changes. ENT: No sore throat. Cardiovascular: Denies chest pain. Respiratory: Denies shortness of breath. Gastrointestinal: No abdominal pain.  No nausea, no vomiting.  No diarrhea.  No constipation. Genitourinary: Negative for dysuria. Musculoskeletal: Negative for back pain. Skin: Negative for rash. Neurological: Negative for focal weakness or numbness. Positive HA.   10-point ROS otherwise  negative.  ____________________________________________   PHYSICAL EXAM:  VITAL SIGNS: ED Triage Vitals  Enc Vitals Group     BP 06/19/16 1149 115/79     Pulse Rate 06/19/16 1149 74     Resp 06/19/16 1149 20     Temp 06/19/16 1149 98.2 F (36.8 C)     Temp Source 06/19/16 1149 Oral     SpO2 06/19/16 1149 99 %     Weight 06/19/16 1148 206 lb (93.4 kg)     Height 06/19/16 1148 5\' 5"  (1.651 m)     Pain Score 06/19/16 1148 8   Constitutional: Alert and oriented. Well appearing and in no acute distress. Eyes: Conjunctivae are normal.  Head: Atraumatic. Nose: No congestion/rhinnorhea. Mouth/Throat: Mucous membranes are moist.  Oropharynx non-erythematous. Neck: No stridor. Cardiovascular: Normal rate, regular rhythm. Good peripheral circulation. Grossly normal heart sounds.   Respiratory: Normal respiratory effort.  No retractions. Lungs CTAB. Gastrointestinal: Soft and nontender. No distention.  Musculoskeletal: No lower extremity tenderness nor edema. No gross deformities of extremities. Neurologic:  Normal speech and language. No gross focal neurologic deficits are appreciated.  Skin:  Skin is warm, dry and intact. No rash noted.  ____________________________________________   PROCEDURES  Procedure(s) performed:   Procedures  None  ____________________________________________   INITIAL IMPRESSION / ASSESSMENT AND PLAN / ED COURSE  Pertinent labs & imaging results that were available during my care of the patient were reviewed by me and considered in my medical decision making (see chart for details).  Patient presents to the emergency department for evaluation of migraine headache. Her headache today is exactly the same as prior migraines. No focal neurological deficits on exam. Plan for IV fluid, steroid, Toradol, Zofran, Benadryl and reassessment. No indication for lab work of head imaging at this time.   01:16 PM Patient is feeling much better. Will call ride  for transport home. Discussed return precautions in detail.   At this time, I do not feel there is any life-threatening condition present. I have reviewed and discussed all results (EKG, imaging, lab, urine as appropriate), exam findings with patient. I have reviewed nursing notes and appropriate previous records.  I feel the patient is safe to be discharged home without further emergent workup. Discussed usual and customary return precautions. Patient and family (if present) verbalize understanding and are comfortable with this plan.  Patient will follow-up with their primary care provider. If they do not have a primary care provider, information for follow-up has been provided to them. All questions have been answered.  ____________________________________________  FINAL CLINICAL IMPRESSION(S) / ED DIAGNOSES  Final diagnoses:  Migraine with aura and without status migrainosus, not intractable     MEDICATIONS GIVEN DURING THIS VISIT:  Medications  sodium chloride 0.9 % bolus 1,000 mL (0 mLs Intravenous Stopped 06/19/16 1338)  diphenhydrAMINE (BENADRYL) injection 50 mg (50 mg Intravenous Given 06/19/16 1222)  ketorolac (TORADOL) 30 MG/ML injection 30 mg (30 mg Intravenous Given 06/19/16 1222)  ondansetron (ZOFRAN) injection 4 mg (4 mg Intravenous Given 06/19/16 1222)  methylPREDNISolone sodium succinate (SOLU-MEDROL) 125 mg/2 mL injection 125 mg (125 mg  Intravenous Given 06/19/16 1222)     NEW OUTPATIENT MEDICATIONS STARTED DURING THIS VISIT:  None   Note:  This document was prepared using Dragon voice recognition software and may include unintentional dictation errors.  Nanda Quinton, MD Emergency Medicine   Margette Fast, MD 06/19/16 [phone]

## 2016-06-19 NOTE — ED Triage Notes (Signed)
Pt reports a migraine since 4am - vision changes, nausea, posterior right sided headache - states feels similar to previous migraines but PCP referred her here for IV fluids. Failed home treatment with [name], diflocenac, and Tylenol. PT has driver.

## 2016-06-20 MED FILL — DICLOFENAC SOD EC 50 MG TAB: 50 | 15 days supply | Qty: 30 | Fill #0

## 2016-06-20 MED FILL — VERAPAMIL ER 120 MG TABLET: 120 | 30 days supply | Qty: 60 | Fill #1

## 2016-06-20 MED FILL — ZOMIG 5 MG NASAL SPRAY: 5 | 30 days supply | Qty: 6 | Fill #0

## 2016-06-20 MED FILL — TOPIRAMATE 50 MG TABLET: 50 | 30 days supply | Qty: 30 | Fill #0

## 2016-06-30 IMAGING — MG MM SCREEN MAMMOGRAM BILATERAL
4 series · 4 of 4 positions shown · non-contrast
Comparison: Previous exam(s).

CLINICAL DATA: Screening.

EXAM:
DIGITAL SCREENING BILATERAL MAMMOGRAM WITH CAD

[R CC]
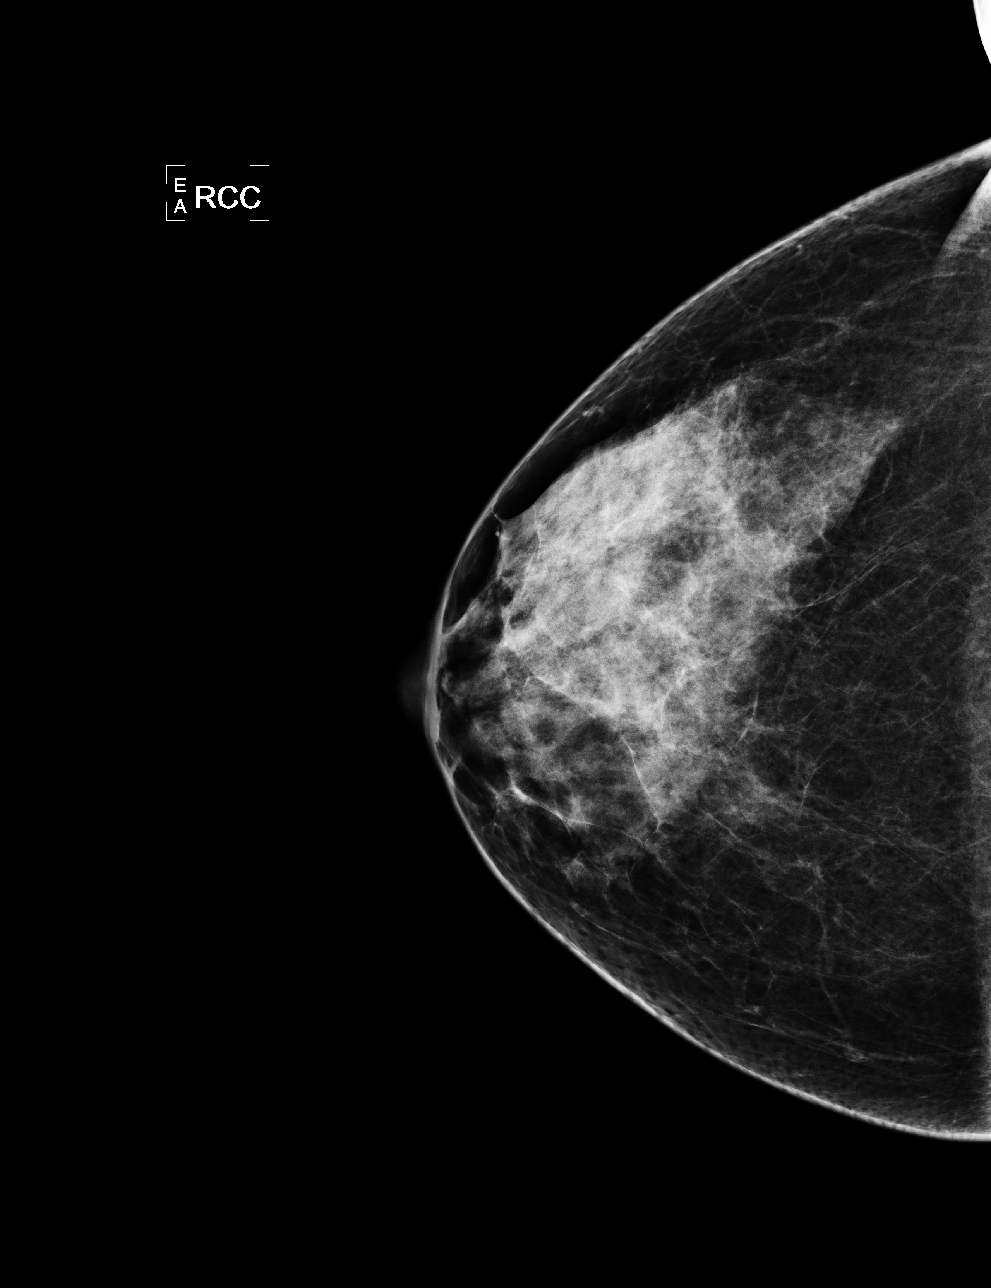

[L CC]
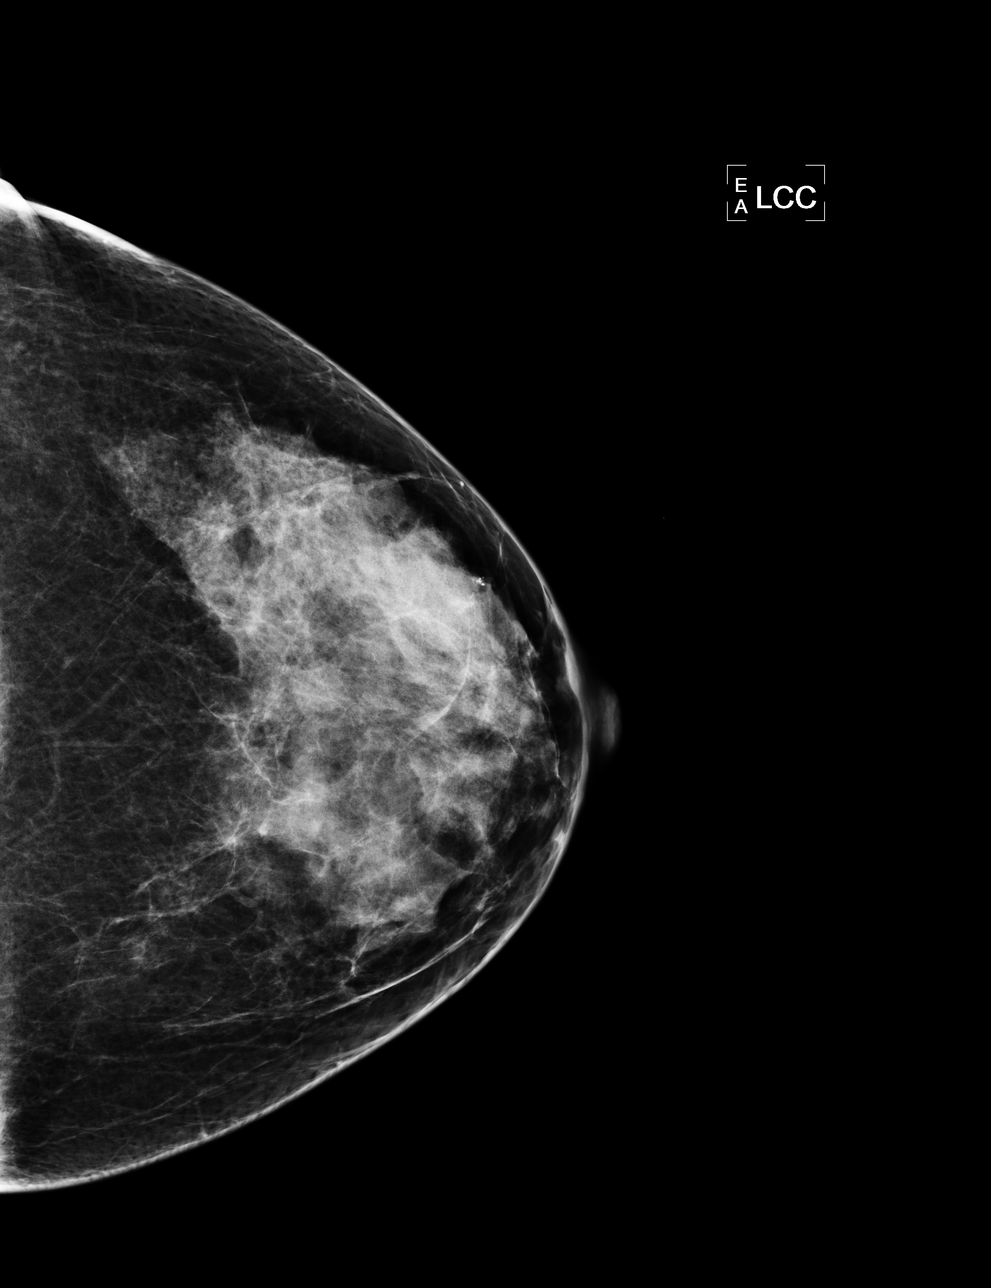

[L MLO]
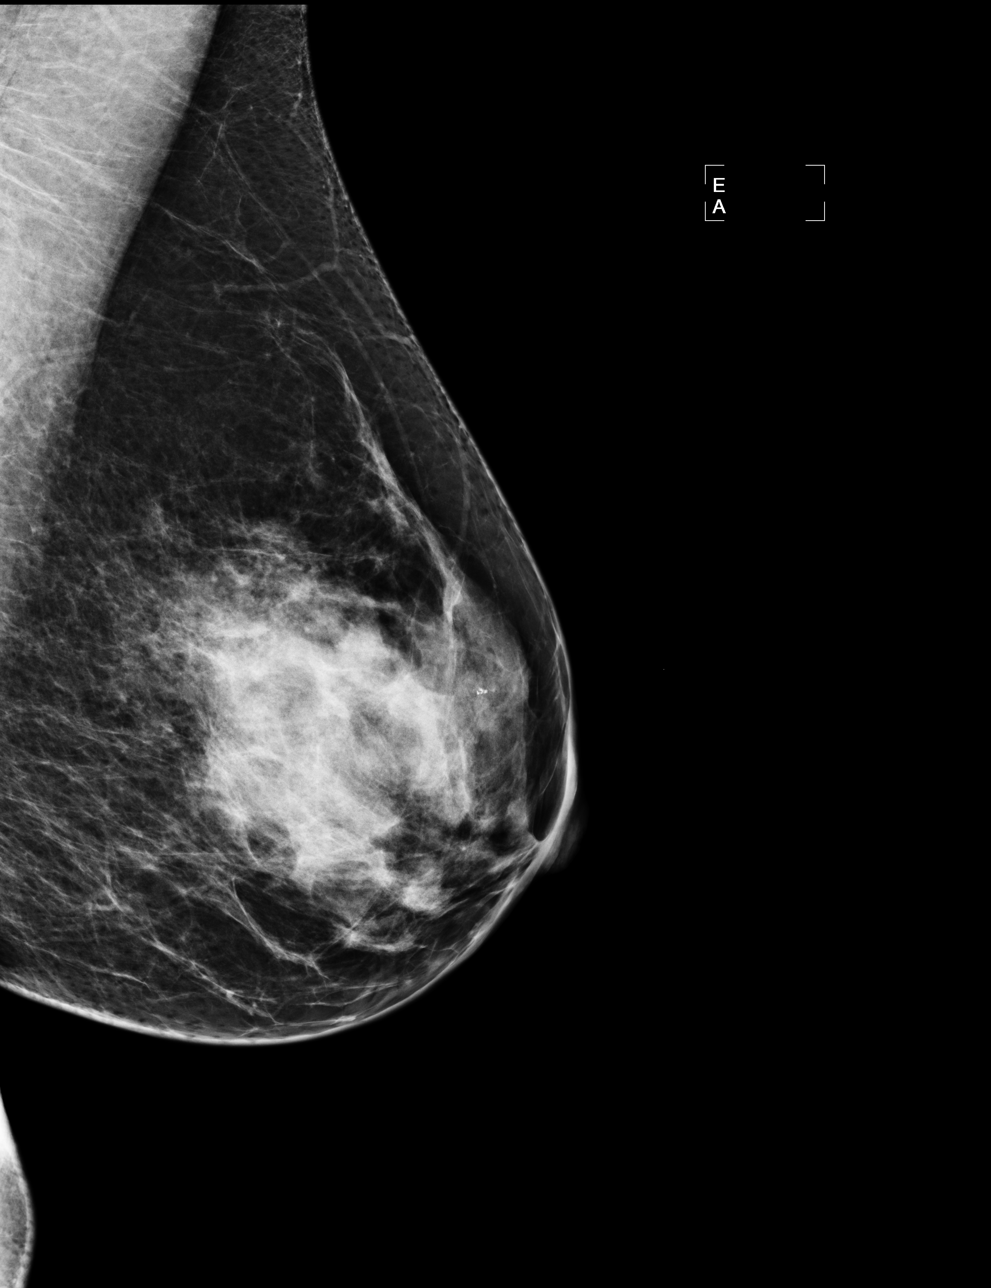

[R MLO]
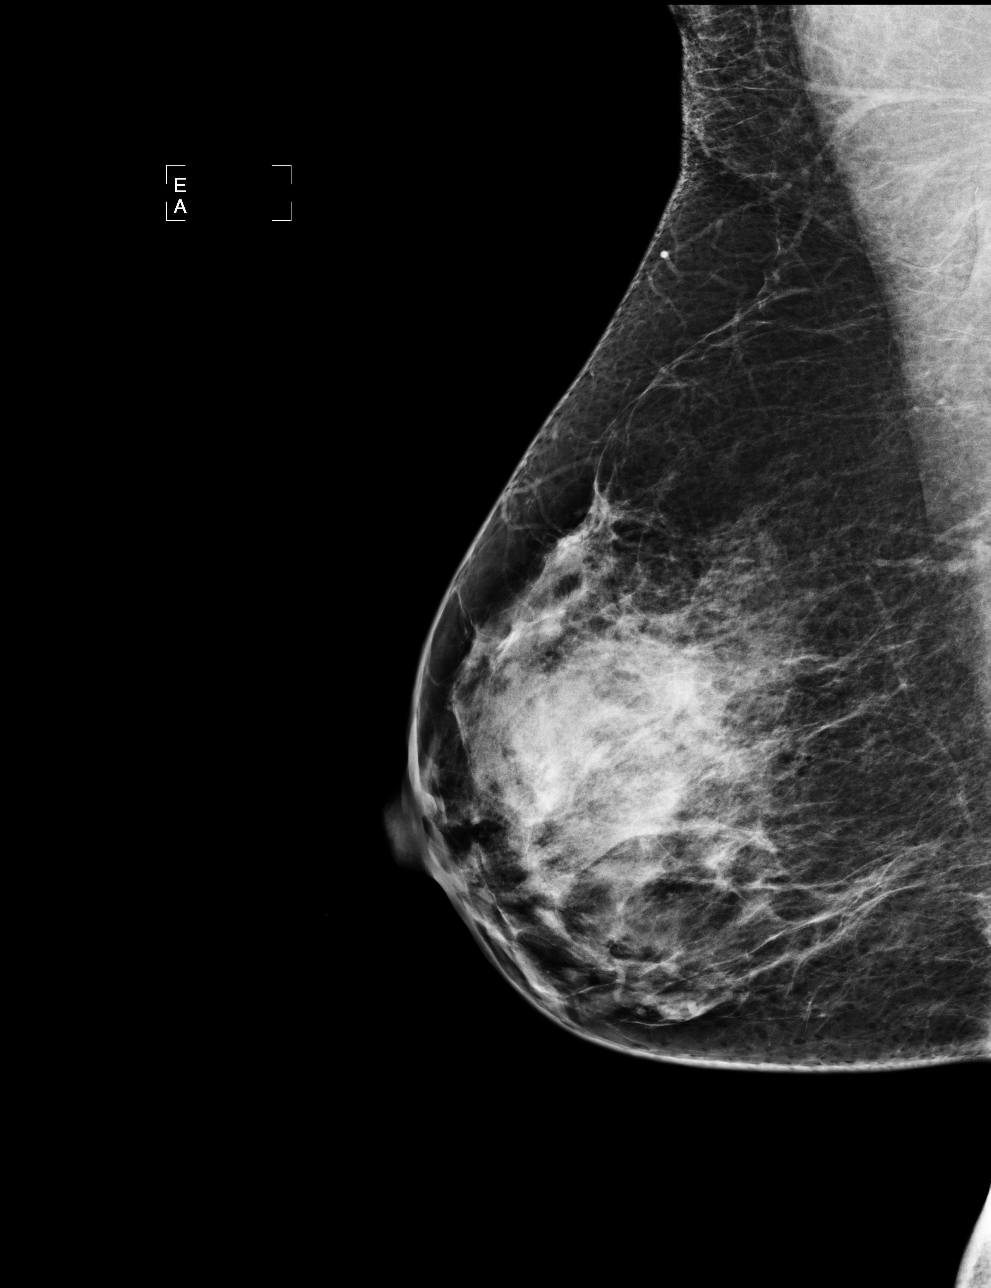

[4 of 4 positions shown; findings below may reference images not displayed]

ACR Breast Density Category c: The breast tissue is heterogeneously
dense, which may obscure small masses.
FINDINGS: There are no findings suspicious for malignancy. Images were
processed with CAD.
IMPRESSION: No mammographic evidence of malignancy. A result letter of this
screening mammogram will be mailed directly to the patient.

RECOMMENDATION:
Screening mammogram in one year. (Code:YJ-[id number])

BI-RADS CATEGORY  1: Negative.

## 2016-07-11 ENCOUNTER — Encounter (HOSPITAL_BASED_OUTPATIENT_CLINIC_OR_DEPARTMENT_OTHER): Payer: Self-pay | Admitting: Emergency Medicine

## 2016-07-11 ENCOUNTER — Emergency Department (HOSPITAL_BASED_OUTPATIENT_CLINIC_OR_DEPARTMENT_OTHER): Payer: 59

## 2016-07-11 ENCOUNTER — Emergency Department (HOSPITAL_BASED_OUTPATIENT_CLINIC_OR_DEPARTMENT_OTHER)
Admission: EM | Admit: 2016-07-11 | Discharge: 2016-07-11 | Disposition: A | Discharge status: Home / Self Care | Payer: 59 | Attending: Emergency Medicine | Admitting: Emergency Medicine

## 2016-07-11 DIAGNOSIS — Z79899 Other long term (current) drug therapy: Secondary | ICD-10-CM | POA: Diagnosis not present

## 2016-07-11 DIAGNOSIS — R079 Chest pain, unspecified: Secondary | ICD-10-CM | POA: Diagnosis not present

## 2016-07-11 DIAGNOSIS — Z87891 Personal history of nicotine dependence: Secondary | ICD-10-CM | POA: Diagnosis not present

## 2016-07-11 DIAGNOSIS — R0602 Shortness of breath: Secondary | ICD-10-CM | POA: Insufficient documentation

## 2016-07-11 DIAGNOSIS — R072 Precordial pain: Secondary | ICD-10-CM | POA: Diagnosis not present

## 2016-07-11 DIAGNOSIS — R05 Cough: Secondary | ICD-10-CM | POA: Diagnosis not present

## 2016-07-11 LAB — BASIC METABOLIC PANEL
Anion gap: 9 (ref 5–15)
BUN: 14 mg/dL (ref 6–20)
CO2: 21 mmol/L — ABNORMAL LOW (ref 22–32)
CREATININE: 0.89 mg/dL (ref 0.44–1.00)
Calcium: 8.8 mg/dL — ABNORMAL LOW (ref 8.9–10.3)
Chloride: 109 mmol/L (ref 101–111)
GFR calc Af Amer: >60 mL/min (ref >60)
Glucose, Bld: 103 mg/dL — ABNORMAL HIGH (ref 65–99)
Potassium: 3.7 mmol/L (ref 3.5–5.1)
SODIUM: 139 mmol/L (ref 135–145)

## 2016-07-11 LAB — CBC
HCT: 38.5 % (ref 36.0–46.0)
Hemoglobin: 12.8 g/dL (ref 12.0–15.0)
MCH: 31.4 pg (ref 26.0–34.0)
MCHC: 33.2 g/dL (ref 30.0–36.0)
MCV: 94.6 fL (ref 78.0–100.0)
PLATELETS: 289 10*3/uL (ref 150–400)
RBC: 4.07 MIL/uL (ref 3.87–5.11)
RDW: 13.1 % (ref 11.5–15.5)
WBC: 7.6 10*3/uL (ref 4.0–10.5)

## 2016-07-11 LAB — URINALYSIS, ROUTINE W REFLEX MICROSCOPIC
Bilirubin Urine: NEGATIVE
GLUCOSE, UA: NEGATIVE mg/dL
HGB URINE DIPSTICK: NEGATIVE
KETONES UR: NEGATIVE mg/dL
Leukocytes, UA: NEGATIVE
Nitrite: NEGATIVE
PROTEIN: NEGATIVE mg/dL
Specific Gravity, Urine: 1.008 (ref 1.005–1.030)
pH: 7.5 (ref 5.0–8.0)

## 2016-07-11 LAB — D-DIMER, QUANTITATIVE (NOT AT ARMC): D DIMER QUANT: 0.31 ug{FEU}/mL (ref 0.00–0.50)

## 2016-07-11 LAB — TROPONIN I: Troponin I: <0.03 ng/mL (ref <0.03)

## 2016-07-11 MED ORDER — PREDNISONE 50 MG PO TABS: 50.0000 mg | ORAL_TABLET | Freq: Every day | ORAL | 0 refills | Status: AC

## 2016-07-11 MED ORDER — ALBUTEROL SULFATE HFA 108 (90 BASE) MCG/ACT IN AERS: 2.0000 | INHALATION_SPRAY | Freq: Once | RESPIRATORY_TRACT | Status: DC

## 2016-07-11 MED ORDER — IPRATROPIUM-ALBUTEROL 0.5-2.5 (3) MG/3ML IN SOLN
3.0000 mL | Freq: Four times a day (QID) | RESPIRATORY_TRACT | Status: DC
  Administered 2016-07-11: 3 mL via RESPIRATORY_TRACT
  Filled 2016-07-11: qty 3

## 2016-07-11 MED ORDER — METHYLPREDNISOLONE SODIUM SUCC 125 MG IJ SOLR
125.0000 mg | Freq: Once | INTRAMUSCULAR | Status: AC
  Administered 2016-07-11: 125 mg via INTRAVENOUS
  Filled 2016-07-11: qty 2

## 2016-07-11 NOTE — ED Triage Notes (Signed)
Patient states that she started to have SOb and Chest pain around 10 am, the patient reports that wheezing. Denies any N/V dizziness. Has a dry cough

## 2016-07-11 NOTE — ED Provider Notes (Signed)
[facility] Provider Note   CSN: [id number] Arrival date & time: 07/11/16  1819  By signing my name below, I, Jaquelyn Bitter., attest that this documentation has been prepared under the direction and in the presence of Mckennon Zwart, Gwenyth Allegra, *. Electronically signed: Jaquelyn Bitter., ED Scribe. 07/11/16. 9:01 PM.    History   Chief Complaint Chief Complaint  Patient presents with  . Chest Pain  . Shortness of Breath    HPI Jody Blackwell is a 51 y.o. female with hx of migraines,  who presents to the Emergency Department complaining of chest pain with onset x12 hours. Pt states that she has had chest pain and SOB since developing a dry cough while in a meeting this afternoon. Pt states that while driving home x5 hours ago, she developed a sharp chest pain that she states radiates through the center of the chest to the back and shoulder blades. Pt rates the pain 3/10 currently, 7/10 at worst. She reports headache, wheezing, chest tightness, ankle swelling, nausea. She denies any modifying factors. Pt denies hemoptysis, constipation, diarrhea, vomiting, rhinorrhea, congestion, palpations, syncope, diaphoresis. Pt reports family hx of heart disease (father- 19 years old.) She denies hx of asthma, hx of DVT. Of note, pt reports having bronchitis x3 months ago.   The history is provided by the patient. No language interpreter was used.  Chest Pain   The current episode started 3 to 5 hours ago. The problem occurs constantly. The problem has been gradually improving. The pain is associated with breathing. The pain is present in the substernal region. The pain is at a severity of 3/10. The pain is moderate. The quality of the pain is described as pleuritic and sharp. The pain radiates to the upper back and left shoulder. The symptoms are aggravated by deep breathing. Associated symptoms include cough, headaches, malaise/fatigue, nausea and shortness of breath.  Pertinent negatives include no abdominal pain, no back pain, no diaphoresis, no dizziness, no exertional chest pressure, no fever, no hemoptysis, no near-syncope, no numbness, no palpitations, no vomiting and no weakness. She has tried nothing for the symptoms. The treatment provided no relief. Risk factors: estrogen use.  Her family medical history is significant for heart disease.    Past Medical History:  Diagnosis Date  . Arthritis    OA in hands, hip and feet  . Chronic sinusitis   . Complication of anesthesia   . Depression   . GERD (gastroesophageal reflux disease)   . Joint pain   . Migraine   . PONV (postoperative nausea and vomiting)     Patient Active Problem List   Diagnosis Date Noted  . Peroneal tendon tear 07/20/2015  . Left ankle pain 08/03/2010  . Left shoulder pain 06/29/2010    Past Surgical History:  Procedure Laterality Date  . ABDOMINAL HYSTERECTOMY    . ANKLE RECONSTRUCTION Right 07/20/2015   Procedure: OPEN EXPLORATION OF LATERAL RIGHT PERONEUS BREVIS TENDON WITH REPAIR ;  Surgeon: Garald Balding, MD;  Location: [facility];  Service: Orthopedics;  Laterality: Right;  . CESAREAN SECTION     x 2  . CHOLECYSTECTOMY  2012   lap choli  . MASS EXCISION  06/03/2011   Procedure: EXCISION MASS;  Surgeon: Schuyler Amor, MD;  Location: [facility];  Service: Orthopedics;  Laterality: Right;  EXCISION MASS RIGHT MIDDLE FINGER   . NASAL SINUS SURGERY  2007, 2010    OB History  No data available       Home Medications    Prior to Admission medications   Medication Sig Start Date End Date Taking? Authorizing Provider  baclofen ([name]) 10 MG tablet Take 10 mg by mouth 3 (three) times daily as needed for muscle spasms.    [provider]  calcium-[name] 250-100 MG-UNIT tablet Take 1 tablet by mouth 2 (two) times daily.    [provider]  escitalopram (LEXAPRO) 5 MG tablet Take 5 mg by mouth daily.     [provider]  esomeprazole ([name]) 40 MG capsule Take 40 mg by mouth daily at 12 noon.    [provider]  estrogens, conjugated, ([name]) 0.625 MG tablet Take 0.625 mg by mouth daily.      [provider]  magnesium hydroxide (MILK OF MAGNESIA) 400 MG/5ML suspension Take by mouth daily as needed for mild constipation.    [provider]  prochlorperazine (COMPAZINE) 10 MG tablet Take 1 tablet (10 mg total) by mouth 2 (two) times daily as needed for nausea or vomiting (or headaches). 06/08/16   Street, Mercedes, PA-C  ranitidine ([name]) 150 MG capsule Take 150 mg by mouth 2 (two) times daily.    [provider]  TIZANIDINE HCL PO Take by mouth.    [provider]  topiramate ([name]) 100 MG tablet Take 100 mg by mouth 2 (two) times daily.    [provider]  verapamil (CALAN-SR) 120 MG CR tablet Take 120 mg by mouth 2 (two) times daily.    [provider]  zolmitriptan ([name]) 5 MG nasal solution Place into the nose as needed for migraine.    [provider]    Family History Family History  Problem Relation Age of Onset  . Diabetes Mother   . Diabetes Father   . Heart attack Father   . Hypertension Father   . Diabetes Maternal Grandfather   . Heart attack Maternal Grandfather   . Hypertension Maternal Grandfather   . Diabetes Paternal Grandfather   . Heart attack Paternal Grandfather   . Hypertension Paternal Grandfather     Social History Social History  Substance Use Topics  . Smoking status: Former Smoker    Quit date: 05/30/1989  . Smokeless tobacco: Never Used  . Alcohol use No     Allergies   Aspirin; Ibuprofen; Metoclopramide hcl; Sulfa antibiotics; Triptans; and Zithromax [azithromycin dihydrate]   Review of Systems Review of Systems  Constitutional: Positive for malaise/fatigue. Negative for chills, diaphoresis, fatigue and fever.  HENT: Negative for congestion and rhinorrhea.    Respiratory: Positive for cough, chest tightness, shortness of breath and wheezing. Negative for hemoptysis, choking and stridor.   Cardiovascular: Positive for chest pain. Negative for palpitations and near-syncope.  Gastrointestinal: Positive for nausea. Negative for abdominal pain, constipation, diarrhea and vomiting.  Genitourinary: Negative for dysuria.  Musculoskeletal: Negative for back pain, neck pain and neck stiffness.  Skin: Negative for rash and wound.  Neurological: Positive for headaches. Negative for dizziness, syncope, weakness, light-headedness and numbness.  Psychiatric/Behavioral: Negative for agitation and confusion.  All other systems reviewed and are negative.    Physical Exam Updated Vital Signs BP 104/65 (BP Location: Left Arm)   Pulse 72   Temp 98.8 F (37.1 C) (Oral)   Resp 18   Ht 5\' 5"  (1.651 m)   Wt 206 lb (93.4 kg)   SpO2 100%   BMI 34.28 kg/m   Physical Exam  Constitutional: She appears well-developed  and well-nourished. No distress.  HENT:  Head: Normocephalic and atraumatic.  Nose: Nose normal.  Mouth/Throat: Oropharynx is clear and moist. No oropharyngeal exudate.  Eyes: Conjunctivae and EOM are normal. Pupils are equal, round, and reactive to light.  Neck: Normal range of motion. Neck supple.  Cardiovascular: Normal rate and regular rhythm.   No murmur heard. Pulmonary/Chest: Effort normal. No respiratory distress. She has decreased breath sounds in the right upper field, the right middle field, the right lower field, the left upper field, the left middle field and the left lower field. She has wheezes in the right upper field, the right middle field, the right lower field, the left upper field, the left middle field and the left lower field. She has no rhonchi. She has no rales. She exhibits tenderness.    Decreased air movement in all fields, expiratory wheezes in all fields. L chest wall tenderness.   Abdominal: Soft. There is no  tenderness.  Musculoskeletal: She exhibits no edema or tenderness.  Neurological: She is alert. No sensory deficit. She exhibits normal muscle tone.  Skin: Skin is warm and dry. Capillary refill takes less than 2 seconds. No rash noted. No erythema.  Psychiatric: She has a normal mood and affect.  Nursing note and vitals reviewed.    ED Treatments / Results   DIAGNOSTIC STUDIES: Oxygen Saturation is 100% on RA, normal by my interpretation.   COORDINATION OF CARE: 9:02 PM-Discussed next steps with pt. Pt verbalized understanding and is agreeable with the plan.    Labs (all labs ordered are listed, but only abnormal results are displayed) Labs Reviewed  BASIC METABOLIC PANEL - Abnormal; Notable for the following:       Result Value   CO2 21 (*)    Glucose, Bld 103 (*)    Calcium 8.8 (*)    All other components within normal limits  CBC  TROPONIN I  D-DIMER, QUANTITATIVE (NOT AT [facility])  TROPONIN I  URINALYSIS, ROUTINE W REFLEX MICROSCOPIC    EKG  EKG Interpretation  Date/Time:  Thursday Jul 11 2016 18:26:40 EDT Ventricular Rate:  82 PR Interval:  136 QRS Duration: 82 QT Interval:  414 QTC Calculation: 483 R Axis:   23 Text Interpretation:  Normal sinus rhythm Cannot rule out Anterior infarct , age undetermined Abnormal ECG No prior ECG fior comparison. No STEMI Confirmed by Antony Blackbird [id number]) on 07/11/2016 10:56:47 PM Also confirmed by Antony Blackbird [phone]), editor Hattie Perch ([id number])  on 07/12/2016 7:21:45 AM       Radiology Dg Chest 2 View  Result Date: 07/11/2016 CLINICAL DATA:  Pt co chest pain, sob, x today, cough x 2 days EXAM: CHEST  2 VIEW COMPARISON:  None. FINDINGS: The cardiac silhouette is normal in size and configuration. Normal mediastinal and hilar contours. There is a vague nodular opacity in the right upper lobe. Minor reticular scarring in the left upper lobe lingula at the lung base. Lungs otherwise clear. No pleural effusion or  pneumothorax. Skeletal structures are intact. IMPRESSION: 1. No acute cardiopulmonary disease. 2. Possible right upper lobe lung nodule. Recommend follow-up chest CT without contrast on a nonemergent basis. Electronically Signed   By: Lajean Manes M.D.   On: 07/11/2016 18:47    Procedures Procedures (including critical care time)  Medications Ordered in ED Medications  methylPREDNISolone sodium succinate (SOLU-MEDROL) 125 mg/2 mL injection 125 mg (125 mg Intravenous Given 07/11/16 2206)     Initial Impression / Assessment and Plan / ED Course  I have reviewed the triage vital signs and the nursing notes.  Pertinent labs & imaging results that were available during my care of the patient were reviewed by me and considered in my medical decision making (see chart for details).     Jody Blackwell is a 51 y.o. female With a past medical history significant for depression, GERD, and migraines who presents with chest pain, shortness of breath, and wheezing. Patient reports that today, while in the meeting, she began having some shortness of breath and wheezing. Patient said she began feeling of chest tightness that was across her chest and into her back. She reported a dry cough with it. She reports some nausea and vomiting. She denied any diaphoresis, lightheadedness, or syncope. Patient denies history of this but had a history of wheezing with a recent bronchitis. Patient was given albuterol but had not used it.  History and exam are seen above. On exam, patient had wheezing and decreased breath sounds and all lung fields. Patient's chest was slightly tender to palpation. Patient had no lower extremity swelling or edema. No tenderness in the legs. Pulses symmetric on both upper extremity's. No murmurs. No focal neurologic deficits. Patient remained on room air with normal option saturations.  Heart score calculated as a three. Patient does have estrogen use but no history of DVT/PE.  EKG  showed no evidence of acute ischemia. Diagnostic testing results are seen above. D dimer and troponin negative times two. Labs reassuring. Patient felt much better after DuoNeb breathing treatment and steroids.  Patient chest tightness in chest discomfort significantly improved after breathing treatments. Suspect reactive airway disease exacerbation causing symptoms. This may be in the setting of seasonal allergies and pollen versus URI.  Given reassuring workup and demonstrated stability in the ED, patient felt stable for discharge. Patient will be a prescription for steroids for the next several days and instructions to follow up with PCP. Patient given strict return precautions for any new or worsened symptoms. Patient understood plan of care and was discharged with family in good condition.    Final Clinical Impressions(s) / ED Diagnoses   Final diagnoses:  Shortness of breath  Precordial pain    New Prescriptions Discharge Medication List as of 07/11/2016 11:21 PM    START taking these medications   Details  predniSONE (DELTASONE) 50 MG tablet Take 1 tablet (50 mg total) by mouth daily., Starting Fri 07/12/2016, Until Wed 07/17/2016, Print       I personally performed the services described in this documentation, which was scribed in my presence. The recorded information has been reviewed and is accurate.  Clinical Impression: 1. Shortness of breath   2. Precordial pain     Disposition: Discharge  Condition: Good  I have discussed the results, Dx and Tx plan with the pt(& family if present). He/she/they expressed understanding and agree(s) with the plan. Discharge instructions discussed at great length. Strict return precautions discussed and pt &/or family have verbalized understanding of the instructions. No further questions at time of discharge.    Discharge Medication List as of 07/11/2016 11:21 PM    START taking these medications   Details  predniSONE (DELTASONE) 50  MG tablet Take 1 tablet (50 mg total) by mouth daily., Starting Fri 07/12/2016, Until Wed 07/17/2016, Print        Follow Up: Mayra Neer, MD [address] [place] [address] [phone]  Schedule an appointment as soon as possible for a visit  [facility] [address] [id number] [place] [address] [phone]  If symptoms worsen      Angeleena Dueitt, Gwenyth Allegra, MD 07/12/16 1244

## 2016-07-11 NOTE — Discharge Instructions (Signed)
Please take your prednisone for the next 5 days to treat your reactive airway disease exacerbation. Please use your albuterol at home with the spacer as directed. Please schedule a follow-up appointment with a primary care physician for further management of your pain and shortness of breath. If symptoms change or worsen, please return to the nearest emergency department.

## 2016-07-11 NOTE — ED Notes (Signed)
ED Provider at bedside. 

## 2016-07-12 MED FILL — predniSONE 50 MG TABS: 50 | 5 days supply | Qty: 5 | Fill #0

## 2016-07-18 ENCOUNTER — Other Ambulatory Visit (HOSPITAL_COMMUNITY): Payer: Self-pay | Admitting: Physician Assistant

## 2016-07-18 DIAGNOSIS — R05 Cough: Secondary | ICD-10-CM | POA: Diagnosis not present

## 2016-07-18 DIAGNOSIS — R9389 Abnormal findings on diagnostic imaging of other specified body structures: Secondary | ICD-10-CM

## 2016-07-18 DIAGNOSIS — K219 Gastro-esophageal reflux disease without esophagitis: Secondary | ICD-10-CM | POA: Diagnosis not present

## 2016-07-18 DIAGNOSIS — R51 Headache: Secondary | ICD-10-CM | POA: Diagnosis not present

## 2016-07-18 DIAGNOSIS — R938 Abnormal findings on diagnostic imaging of other specified body structures: Secondary | ICD-10-CM | POA: Diagnosis not present

## 2016-07-18 MED FILL — TOPIRAMATE 50 MG TABLET: 50 | 30 days supply | Qty: 30 | Fill #0

## 2016-07-18 MED FILL — raNITIdine HCL 150 MG TABS: 150 | 30 days supply | Qty: 60 | Fill #0

## 2016-07-18 MED FILL — TOPIRAMATE 100 MG TABLET: 100 | 30 days supply | Qty: 30 | Fill #0

## 2016-07-19 ENCOUNTER — Ambulatory Visit (HOSPITAL_COMMUNITY): Payer: 59

## 2016-07-19 ENCOUNTER — Ambulatory Visit (HOSPITAL_COMMUNITY)
Admission: RE | Admit: 2016-07-19 | Discharge: 2016-07-19 | Disposition: A | Discharge status: Home / Self Care | Payer: 59 | Source: Ambulatory Visit | Attending: Physician Assistant | Admitting: Physician Assistant

## 2016-07-19 DIAGNOSIS — R918 Other nonspecific abnormal finding of lung field: Secondary | ICD-10-CM | POA: Diagnosis not present

## 2016-07-19 DIAGNOSIS — R938 Abnormal findings on diagnostic imaging of other specified body structures: Secondary | ICD-10-CM | POA: Insufficient documentation

## 2016-07-19 DIAGNOSIS — R9389 Abnormal findings on diagnostic imaging of other specified body structures: Secondary | ICD-10-CM

## 2016-07-26 MED FILL — VERAPAMIL ER 120 MG TABLET: 120 | 30 days supply | Qty: 60 | Fill #0

## 2016-08-01 MED FILL — ESOMEPRAZOLE MAG DR 40 MG C: 40 | 90 days supply | Qty: 90 | Fill #1

## 2016-08-01 MED FILL — PREMARIN 0.625 MG TABLET: 0.625 | 90 days supply | Qty: 90 | Fill #1

## 2016-08-06 DIAGNOSIS — R05 Cough: Secondary | ICD-10-CM | POA: Diagnosis not present

## 2016-08-06 DIAGNOSIS — R0602 Shortness of breath: Secondary | ICD-10-CM | POA: Diagnosis not present

## 2016-08-06 DIAGNOSIS — J069 Acute upper respiratory infection, unspecified: Secondary | ICD-10-CM | POA: Diagnosis not present

## 2016-08-06 MED FILL — VENTOLIN HFA 90 MCG INHALER: 108 (90 BAS | 30 days supply | Qty: 18 | Fill #0

## 2016-08-06 MED FILL — CLARITHROMYCIN 500 MG TAB: 500 | 7 days supply | Qty: 14 | Fill #0

## 2016-08-22 MED FILL — VERAPAMIL ER 120 MG TABLET: 120 | 30 days supply | Qty: 60 | Fill #1

## 2016-08-22 MED FILL — POLYMYXIN B/TMP EYE DROPS: 10000-0.1 | 25 days supply | Qty: 10 | Fill #0

## 2016-08-22 MED FILL — TOPIRAMATE 50 MG TABLET: 50 | 30 days supply | Qty: 30 | Fill #1

## 2016-08-22 MED FILL — TOPIRAMATE 100 MG TABLET: 100 | 30 days supply | Qty: 30 | Fill #1

## 2016-08-29 MED FILL — DICLOFENAC SOD EC 50 MG TAB: 50 | 15 days supply | Qty: 30 | Fill #1

## 2016-09-12 DIAGNOSIS — R51 Headache: Secondary | ICD-10-CM | POA: Diagnosis not present

## 2016-09-12 DIAGNOSIS — G43119 Migraine with aura, intractable, without status migrainosus: Secondary | ICD-10-CM | POA: Diagnosis not present

## 2016-09-12 MED FILL — SUMATRIPTAN SUCC 50 MG TAB: 50 | 30 days supply | Qty: 18 | Fill #0

## 2016-09-16 MED FILL — AIMOVIG 70 MG/ML SOAJ: 70 | 28 days supply | Qty: 1 | Fill #0

## 2016-09-19 ENCOUNTER — Other Ambulatory Visit (INDEPENDENT_AMBULATORY_CARE_PROVIDER_SITE_OTHER): Payer: 59

## 2016-09-19 ENCOUNTER — Encounter: Payer: Self-pay | Admitting: Pulmonary Disease

## 2016-09-19 ENCOUNTER — Ambulatory Visit (INDEPENDENT_AMBULATORY_CARE_PROVIDER_SITE_OTHER): Payer: 59 | Admitting: Pulmonary Disease

## 2016-09-19 VITALS — BP 128/78 | HR 90 | Ht 65.5 in | Wt 214.0 lb

## 2016-09-19 DIAGNOSIS — M7989 Other specified soft tissue disorders: Secondary | ICD-10-CM

## 2016-09-19 DIAGNOSIS — R938 Abnormal findings on diagnostic imaging of other specified body structures: Secondary | ICD-10-CM | POA: Diagnosis not present

## 2016-09-19 DIAGNOSIS — R0600 Dyspnea, unspecified: Secondary | ICD-10-CM

## 2016-09-19 DIAGNOSIS — R9389 Abnormal findings on diagnostic imaging of other specified body structures: Secondary | ICD-10-CM

## 2016-09-19 LAB — HEPATIC FUNCTION PANEL
ALK PHOS: 43 U/L (ref 39–117)
ALT: 12 U/L (ref 0–35)
AST: 11 U/L (ref 0–37)
Albumin: 3.9 g/dL (ref 3.5–5.2)
BILIRUBIN DIRECT: 0.1 mg/dL (ref 0.0–0.3)
BILIRUBIN TOTAL: 0.4 mg/dL (ref 0.2–1.2)
TOTAL PROTEIN: 6.6 g/dL (ref 6.0–8.3)

## 2016-09-19 LAB — BASIC METABOLIC PANEL
BUN: 15 mg/dL (ref 6–23)
CALCIUM: 9 mg/dL (ref 8.4–10.5)
CO2: 24 mEq/L (ref 19–32)
Chloride: 109 mEq/L (ref 96–112)
Creatinine, Ser: 0.98 mg/dL (ref 0.40–1.20)
GFR: 63.35 mL/min (ref >60.00)
GLUCOSE: 111 mg/dL — AB (ref 70–99)
POTASSIUM: 3.8 meq/L (ref 3.5–5.1)
SODIUM: 139 meq/L (ref 135–145)

## 2016-09-19 LAB — BRAIN NATRIURETIC PEPTIDE: Pro B Natriuretic peptide (BNP): 45 pg/mL (ref 0.0–100.0)

## 2016-09-19 NOTE — Patient Instructions (Signed)
For your AV malformation: We are going to get an echocardiogram to assess your heart and to assess the degree of shunt of blood from the right side to the left side. We will discuss this further with interventional radiology  For your shortness of breath: We will check an echocardiogram We will check a pulmonary function test:  For your leg swelling: We will check labs: a basic metabolic panel, proBNP and liver function testing  We will see you back in 3-4 weeks after these tests are back, sooner if needed, overbook OK

## 2016-09-19 NOTE — Progress Notes (Signed)
Subjective:    Patient ID: Jody Blackwell, female    DOB: May 16, 1965, 51 y.o.   MRN: [id number]  Synopsis: referred in 2018 for evaluation of an isolated AV fistula seen on CT scanning of the chest  HPI Chief Complaint  Patient presents with  . Advice Only    Referred by UC for abnormal CT chest.      Jody Blackwell has had recurrent bronchitis this year and she says that the second episode was associated with tightness in her chest, dyspnea and some mild pain in her chest.  She had a CT scan done which showed a small AV.    Prior to this he has had recurrent bronchitis associated with wheezing form time to time.  She will get a crackling cough from time to time.  She will have some leg swelling from time to time associated with dyspnea.  She says taht the ankle swelling has been a little worse lately and is typically worse with being on her feet a lot.  She has not had heart or lung problems in the past. She notes that her weight is up some too.  Childhood was normal.  Her son has asthma, her father had severe heart disease.  Her father died from multi-system failure.    She has chronic migraines.  She has GERD which is well controlled with dual PPI and H2 blockade.   She notes that she's had significant shortness of breath with exertion such as climbing a stairs or helping her daughter move recently. This is sometimes associated with some mild chest tightness. No associated wheezing. She notes that leg swelling comes and goes with this often.  She smoked briefly as a young adult when she would go out and have alcoholic beverages with friends. She never smoked on a consistent basis and this only lasted for a few years.   Past Medical History:  Diagnosis Date  . Arthritis    OA in hands, hip and feet  . Chronic sinusitis   . Complication of anesthesia   . Depression   . GERD (gastroesophageal reflux disease)   . Joint pain   . Migraine   . PONV (postoperative nausea and vomiting)      Family History  Problem Relation Age of Onset  . Diabetes Mother   . Diabetes Father   . Heart attack Father   . Hypertension Father   . Diabetes Maternal Grandfather   . Heart attack Maternal Grandfather   . Hypertension Maternal Grandfather   . Diabetes Paternal Grandfather   . Heart attack Paternal Grandfather   . Hypertension Paternal Grandfather      Social History   Social History  . Marital status: Married    Spouse name: N/A  . Number of children: N/A  . Years of education: N/A   Occupational History  . Not on file.   Social History Main Topics  . Smoking status: Former Smoker    Packs/day: 0.25    Years: 5.00    Types: Cigarettes    Quit date: 05/30/1989  . Smokeless tobacco: Never Used     Comment: social smoker per pt  . Alcohol use No  . Drug use: No  . Sexual activity: Not on file   Other Topics Concern  . Not on file   Social History Narrative  . No narrative on file     Allergies  Allergen Reactions  . Aspirin Hives  . Ibuprofen Hives  . 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 Sulfa Antibiotics   . Triptans Other (See Comments)    Sensitivity to injectable triptan drugs per pt  . Zithromax [Azithromycin Dihydrate]      Outpatient Medications Prior to Visit  Medication Sig Dispense Refill  . baclofen ([name]) 10 MG tablet Take 10 mg by mouth 3 (three) times daily as needed for muscle spasms.    [name] Kitchen esomeprazole ([name]) 40 MG capsule Take 40 mg by mouth daily at 12 noon.    . estrogens, conjugated, ([name]) 0.625 MG tablet Take 0.625 mg by mouth daily.      . ranitidine ([name]) 150 MG capsule Take 150 mg by mouth daily.     . verapamil (CALAN-SR) 120 MG CR tablet Take 120 mg by mouth 2 (two) times daily.    . calcium-[name] 250-100 MG-UNIT tablet Take 1 tablet by mouth 2 (two) times daily.    [name] Kitchen escitalopram (LEXAPRO) 5 MG tablet Take 5 mg by mouth daily.    . magnesium hydroxide (MILK OF MAGNESIA) 400  MG/5ML suspension Take by mouth daily as needed for mild constipation.    . prochlorperazine (COMPAZINE) 10 MG tablet Take 1 tablet (10 mg total) by mouth 2 (two) times daily as needed for nausea or vomiting (or headaches). 10 tablet 0  . TIZANIDINE HCL PO Take by mouth.    . topiramate ([name]) 100 MG tablet Take 100 mg by mouth 2 (two) times daily.    [name] Kitchen zolmitriptan ([name]) 5 MG nasal solution Place into the nose as needed for migraine.     No facility-administered medications prior to visit.        Review of Systems  Constitutional: Negative for fever and unexpected weight change.  HENT: Negative for congestion, dental problem, ear pain, nosebleeds, postnasal drip, rhinorrhea, sinus pressure, sneezing, sore throat and trouble swallowing.   Eyes: Negative for redness and itching.  Respiratory: Positive for cough, shortness of breath and wheezing. Negative for chest tightness.   Cardiovascular: Positive for leg swelling. Negative for palpitations.  Gastrointestinal: Negative for nausea and vomiting.  Genitourinary: Negative for dysuria.  Musculoskeletal: Negative for joint swelling.  Skin: Negative for rash.  Neurological: Negative for headaches.  Hematological: Does not bruise/bleed easily.  Psychiatric/Behavioral: Negative for dysphoric mood. The patient is not nervous/anxious.        Objective:   Physical Exam Vitals:   09/19/16 1442  BP: 128/78  Pulse: 90  SpO2: 95%  Weight: 214 lb (97.1 kg)  Height: 5' 5.5" (1.664 m)   Gen: well appearing, no acute distress HENT: NCAT, OP clear, neck supple without masses Eyes: PERRL, EOMi Lymph: no cervical lymphadenopathy PULM: CTA B CV: RRR, no mgr, no JVD GI: BS+, soft, nontender, no hsm Derm: mild ankle edema, no rash or skin breakdown MSK: normal bulk and tone Neuro: A&Ox4, CN II-XII intact, strength 5/5 in all 4 extremities Psyche: normal mood and affect    Chest imaging: 2018 CT chest imaging independently reviewed  showing a small AV malformation in the right lower lobe, otherwise normal pulmonary parenchyma     Assessment & Plan:  Dyspnea, unspecified type - Plan: Pulmonary function test, Basic Metabolic Panel (BMET), B Nat Peptide, ECHOCARDIOGRAM COMPLETE BUBBLE STUDY, CANCELED: ECHOCARDIOGRAM COMPLETE  Leg swelling - Plan: Hepatic function panel  Abnormal CT of the chest  Discussion: She has been referred to me for evaluation of an abnormal CT scan of the chest in the setting of some unexplained dyspnea and periodic  leg swelling. It's hard for me to find a unifying diagnosis for all of these, though I suppose at the AV malformation was reflective of some other underlying systemic illness we need to investigate that further. Specifically, AV malformations can be seen in the setting of hereditary hemorrhagic telangiectasias, but fortunately she has no family history of that.  So to assess her dyspnea and leg swelling we will check her liver with liver function testing, kidneys with basic metabolic panel testing, and hard with an echocardiogram. Because of the AV malformation we will get a bubble study, of note AV malformation is quite small so the likelihood of a complication from a bubble study is low.  Because of her shortness of breath we will get lung function testing.  Plan: For your AV malformation: We are going to get an echocardiogram to assess your heart and to assess the degree of shunt of blood from the right side to the left side. We will discuss this further with interventional radiology  For your shortness of breath: We will check an echocardiogram We will check a pulmonary function test:  For your leg swelling: We will check labs: a basic metabolic panel, proBNP and liver function testing  We will see you back in 3-4 weeks after these tests are back, sooner if needed, overbook OK    Current Outpatient Prescriptions:  .  albuterol ([name];[name]) 108 (90 Base)  MCG/ACT inhaler, Inhale 1-2 puffs into the lungs every 6 (six) hours as needed for wheezing or shortness of breath., Disp: , Rfl:  .  baclofen ([name]) 10 MG tablet, Take 10 mg by mouth 3 (three) times daily as needed for muscle spasms., Disp: , Rfl:  .  Cholecalciferol ([name]) 2000 units CAPS, Take 1 capsule by mouth daily., Disp: , Rfl:  .  diclofenac (VOLTAREN) 50 MG EC tablet, Take 50 mg by mouth as needed., Disp: , Rfl:  .  Diclofenac Sodium 3 % GEL, Place onto the skin as needed., Disp: , Rfl:  .  esomeprazole ([name]) 40 MG capsule, Take 40 mg by mouth daily at 12 noon., Disp: , Rfl:  .  estrogens, conjugated, ([name]) 0.625 MG tablet, Take 0.625 mg by mouth daily.  , Disp: , Rfl:  .  magnesium oxide ([name]) 400 MG tablet, Take 400 mg by mouth daily., Disp: , Rfl:  .  ondansetron (ZOFRAN) 8 MG tablet, Take 8 mg by mouth as needed for nausea or vomiting., Disp: , Rfl:  .  ranitidine ([name]) 150 MG capsule, Take 150 mg by mouth daily. , Disp: , Rfl:  .  SUMAtriptan ([name]) 50 MG tablet, Take 50 mg by mouth every 2 (two) hours as needed for migraine. May repeat in 2 hours if headache persists or recurs., Disp: , Rfl:  .  triamcinolone cream (KENALOG) 0.1 %, Apply 1 application topically as needed., Disp: , Rfl:  .  UNABLE TO FIND, Amivog 70mg  injected monthly, Disp: , Rfl:  .  verapamil (CALAN-SR) 120 MG CR tablet, Take 120 mg by mouth 2 (two) times daily., Disp: , Rfl:

## 2016-09-20 ENCOUNTER — Institutional Professional Consult (permissible substitution): Payer: 59 | Admitting: Pulmonary Disease

## 2016-09-24 ENCOUNTER — Telehealth: Payer: Self-pay | Admitting: Pulmonary Disease

## 2016-09-24 NOTE — Telephone Encounter (Signed)
After discussing the results of her CT scan with my interventional radiology colleagues we have decided that the best approach would be to perform another CT angiogram in one year. I called her to explain this and she voiced understanding.

## 2016-09-25 ENCOUNTER — Ambulatory Visit (HOSPITAL_COMMUNITY): Payer: 59 | Attending: Cardiology

## 2016-09-25 ENCOUNTER — Other Ambulatory Visit: Payer: Self-pay

## 2016-09-25 DIAGNOSIS — R0600 Dyspnea, unspecified: Secondary | ICD-10-CM | POA: Insufficient documentation

## 2016-09-25 DIAGNOSIS — I081 Rheumatic disorders of both mitral and tricuspid valves: Secondary | ICD-10-CM | POA: Insufficient documentation

## 2016-09-25 DIAGNOSIS — Q211 Atrial septal defect: Secondary | ICD-10-CM | POA: Diagnosis not present

## 2016-10-09 MED FILL — AIMOVIG 70 MG/ML SOAJ: 70 | 28 days supply | Qty: 1 | Fill #1

## 2016-10-22 ENCOUNTER — Encounter: Payer: Self-pay | Admitting: Pulmonary Disease

## 2016-10-22 ENCOUNTER — Ambulatory Visit (INDEPENDENT_AMBULATORY_CARE_PROVIDER_SITE_OTHER): Payer: 59 | Admitting: Pulmonary Disease

## 2016-10-22 VITALS — BP 122/70 | HR 104 | Ht 65.0 in | Wt 212.0 lb

## 2016-10-22 DIAGNOSIS — Q211 Atrial septal defect: Secondary | ICD-10-CM | POA: Diagnosis not present

## 2016-10-22 DIAGNOSIS — Q2112 Patent foramen ovale: Secondary | ICD-10-CM

## 2016-10-22 DIAGNOSIS — R0600 Dyspnea, unspecified: Secondary | ICD-10-CM | POA: Diagnosis not present

## 2016-10-22 DIAGNOSIS — I280 Arteriovenous fistula of pulmonary vessels: Secondary | ICD-10-CM

## 2016-10-22 LAB — PULMONARY FUNCTION TEST
DL/VA % pred: 84 %
DL/VA: 4.19 ml/min/mmHg/L
DLCO COR % PRED: 75 %
DLCO UNC: 21.11 ml/min/mmHg
DLCO cor: 19.93 ml/min/mmHg
DLCO unc % pred: 80 %
FEF 25-75 POST: 2.99 L/s
FEF 25-75 Pre: 3.3 L/sec
FEF2575-%Change-Post: -9 %
FEF2575-%PRED-PRE: 119 %
FEF2575-%Pred-Post: 107 %
FEV1-%Change-Post: -2 %
FEV1-%PRED-POST: 96 %
FEV1-%PRED-PRE: 99 %
FEV1-POST: 2.8 L
FEV1-PRE: 2.87 L
FEV1FVC-%Change-Post: 3 %
FEV1FVC-%PRED-PRE: 104 %
FEV6-%Change-Post: -5 %
FEV6-%PRED-POST: 90 %
FEV6-%PRED-PRE: 95 %
FEV6-POST: 3.23 L
FEV6-Pre: 3.42 L
FEV6FVC-%CHANGE-POST: 0 %
FEV6FVC-%PRED-POST: 103 %
FEV6FVC-%Pred-Pre: 102 %
FVC-%CHANGE-POST: -5 %
FVC-%PRED-POST: 87 %
FVC-%PRED-PRE: 93 %
FVC-POST: 3.23 L
FVC-Pre: 3.43 L
POST FEV6/FVC RATIO: 100 %
PRE FEV1/FVC RATIO: 84 %
PRE FEV6/FVC RATIO: 100 %
Post FEV1/FVC ratio: 87 %
RV % pred: 87 %
RV: 1.66 L
TLC % PRED: 94 %
TLC: 4.98 L

## 2016-10-22 NOTE — Progress Notes (Signed)
Subjective:    Patient ID: Jody Blackwell, female    DOB: 14-May-1965, 51 y.o.   MRN: [id number]  Synopsis: referred in 2018 for evaluation of an isolated AV fistula seen on CT scanning of the chest  HPI Chief Complaint  Patient presents with  . Follow-up    review PFT.     Jody Blackwell has been feeling fine lately.  No changes in her breathing.  She has been staying active but reports no new respiratory complaints.      Past Medical History:  Diagnosis Date  . Arthritis    OA in hands, hip and feet  . Chronic sinusitis   . Complication of anesthesia   . Depression   . GERD (gastroesophageal reflux disease)   . Joint pain   . Migraine   . PONV (postoperative nausea and vomiting)        Review of Systems  Constitutional: Negative for fever and unexpected weight change.  HENT: Negative for congestion, dental problem, ear pain, nosebleeds, postnasal drip, rhinorrhea, sinus pressure, sneezing, sore throat and trouble swallowing.   Eyes: Negative for redness and itching.  Respiratory: Positive for cough, shortness of breath and wheezing. Negative for chest tightness.   Cardiovascular: Positive for leg swelling. Negative for palpitations.  Gastrointestinal: Negative for nausea and vomiting.  Genitourinary: Negative for dysuria.  Musculoskeletal: Negative for joint swelling.  Skin: Negative for rash.  Neurological: Negative for headaches.  Hematological: Does not bruise/bleed easily.  Psychiatric/Behavioral: Negative for dysphoric mood. The patient is not nervous/anxious.        Objective:   Physical Exam Vitals:   10/22/16 1326  BP: 122/70  Pulse: (!) 104  SpO2: 98%  Weight: 212 lb (96.2 kg)  Height: 5\' 5"  (1.651 m)    Gen: well appearing HENT: OP clear, TM's clear, neck supple PULM: CTA B, normal percussion CV: RRR, no mgr, trace edema GI: BS+, soft, nontender Derm: no cyanosis or rash Psyche: normal mood and affect     Chest imaging: 2018 CT chest  imaging independently reviewed showing a small AV malformation in the right lower lobe, otherwise normal pulmonary parenchyma  Echocardiogram: August 2018 echocardiogram with bubble studies showed a small patent foramen ovale, otherwise normal  Pulmonary function testing: 10/22/2016: Ratio 87%, FEV1 2.80 L 96% protected, FVC 3.23 L 87% predicted, total lung capacity 4.98 L 94% protected, DLCO 21.11 mL per minute 80% predicted     Assessment & Plan:   PFO (patent foramen ovale) - Plan: Ambulatory referral to Cardiology, CT Angio Chest W/Cm &/Or Wo Cm  Pulmonary AV (arteriovenous) fistula ([place])  Discussion:   This is been a stable interval for Jody Blackwell. Fortunately her pulmonary AV malformation is so small at only 2 mm. She would only need an intervention if it were larger than 3 mm. I have discussed this with our interventional radiology team and we think the best plan moving forward is to repeat a CT angiogram of her chest in May 2019 to ensure that the lesion is not increasing in size.  We discovered a patent foramen ovale with her workup. I will refer her to interventional cardiology to review further.  Plan: For your patent foramen ovale: I will refer you to interventional cardiology to evaluate this further  For your pulmonary AV malformation: We will arrange for a CT angiogram of your chest in May 2019 and see you after that visit  We will see you back in May 2019 or sooner if needed  Current Outpatient Prescriptions:  .  albuterol ([name];[name]) 108 (90 Base) MCG/ACT inhaler, Inhale 1-2 puffs into the lungs every 6 (six) hours as needed for wheezing or shortness of breath., Disp: , Rfl:  .  baclofen ([name]) 10 MG tablet, Take 10 mg by mouth 3 (three) times daily as needed for muscle spasms., Disp: , Rfl:  .  Cholecalciferol ([name]) 2000 units CAPS, Take 1 capsule by mouth daily., Disp: , Rfl:  .  diclofenac (VOLTAREN) 50 MG EC tablet, Take 50 mg by mouth  as needed., Disp: , Rfl:  .  Diclofenac Sodium 3 % GEL, Place onto the skin as needed., Disp: , Rfl:  .  esomeprazole ([name]) 40 MG capsule, Take 40 mg by mouth daily at 12 noon., Disp: , Rfl:  .  estrogens, conjugated, ([name]) 0.625 MG tablet, Take 0.625 mg by mouth daily.  , Disp: , Rfl:  .  magnesium oxide ([name]) 400 MG tablet, Take 400 mg by mouth daily., Disp: , Rfl:  .  ondansetron (ZOFRAN) 8 MG tablet, Take 8 mg by mouth as needed for nausea or vomiting., Disp: , Rfl:  .  ranitidine ([name]) 150 MG capsule, Take 150 mg by mouth daily. , Disp: , Rfl:  .  SUMAtriptan ([name]) 50 MG tablet, Take 50 mg by mouth every 2 (two) hours as needed for migraine. May repeat in 2 hours if headache persists or recurs., Disp: , Rfl:  .  triamcinolone cream (KENALOG) 0.1 %, Apply 1 application topically as needed., Disp: , Rfl:  .  UNABLE TO FIND, Amivog 70mg  injected monthly, Disp: , Rfl:

## 2016-10-22 NOTE — Progress Notes (Signed)
PFT done today. 

## 2016-10-22 NOTE — Patient Instructions (Signed)
For your patent foramen ovale: I will refer you to interventional cardiology to evaluate this further  For your pulmonary AV malformation: We will arrange for a CT angiogram of your chest in May 2019 and see you after that visit  We will see you back in May 2019 or sooner if needed

## 2016-10-30 MED FILL — ZOMIG 5 MG NASAL SPRAY: 5 | 30 days supply | Qty: 6 | Fill #0

## 2016-10-30 MED FILL — PREMARIN 0.625 MG TABLET: 0.625 | 90 days supply | Qty: 90 | Fill #0

## 2016-10-30 MED FILL — ESOMEPRAZOLE MAG DR 40 MG C: 40 | 90 days supply | Qty: 90 | Fill #0

## 2016-11-04 DIAGNOSIS — F329 Major depressive disorder, single episode, unspecified: Secondary | ICD-10-CM | POA: Diagnosis not present

## 2016-11-04 DIAGNOSIS — K219 Gastro-esophageal reflux disease without esophagitis: Secondary | ICD-10-CM | POA: Diagnosis not present

## 2016-11-05 MED FILL — AIMOVIG 70 MG/ML SOAJ: 70 | 28 days supply | Qty: 1 | Fill #2

## 2016-11-09 NOTE — Progress Notes (Signed)
Cardiology Office Note Date:  11/15/2016   ID:  Parrish, Daddario May 05, 1965, MRN [id number]  PCP:  Mayra Neer, MD  Cardiologist:  Jody Mocha, MD    Chief Complaint  Patient presents with  . Shortness of Breath   History of Present Illness: Jody Blackwell is a 51 y.o. female who presents for evaluation of PFO, referred by Dr Lake Bells.   The patient has had problems with shortness of breath associated with recurrent bronchitis. A CT scan of the chest demonstrated a small pulmonary AVM and this was reviewed with interventional radiology who recommended imaging follow-up at one year intervals to make sure it is not enlarging. The patient had an echo bubble study demonstrating right-to-left shunting of bubbles thought to be secondary to a PFO. She is referred for further evaluation of her PFO.  She has no hx of heart disease. Denies chest pain, palpitations, orthopnea, PND, or lightheadedness. She does have chronic exertional dyspnea and leg swelling that is worse when she's up on her feet. Also notes that she's gained significant weight over the past few years (33# by chart review since 2016).    Past Medical History:  Diagnosis Date  . Arthritis    OA in hands, hip and feet  . Chronic sinusitis   . Complication of anesthesia   . Depression   . GERD (gastroesophageal reflux disease)   . Joint pain   . Migraine   . PONV (postoperative nausea and vomiting)     Past Surgical History:  Procedure Laterality Date  . ABDOMINAL HYSTERECTOMY    . ANKLE RECONSTRUCTION Right 07/20/2015   Procedure: OPEN EXPLORATION OF LATERAL RIGHT PERONEUS BREVIS TENDON WITH REPAIR ;  Surgeon: Garald Balding, MD;  Location: [facility];  Service: Orthopedics;  Laterality: Right;  . CESAREAN SECTION     x 2  . CHOLECYSTECTOMY  2012   lap choli  . MASS EXCISION  06/03/2011   Procedure: EXCISION MASS;  Surgeon: Schuyler Amor, MD;  Location: [facility];   Service: Orthopedics;  Laterality: Right;  EXCISION MASS RIGHT MIDDLE FINGER   . NASAL SINUS SURGERY  2007, 2010    Current Outpatient Prescriptions  Medication Sig Dispense Refill  . baclofen ([name]) 10 MG tablet Take 10 mg by mouth 3 (three) times daily as needed for muscle spasms.    . Cholecalciferol ([name]) 2000 units CAPS Take 1 capsule by mouth daily.    . diclofenac (VOLTAREN) 50 MG EC tablet Take 50 mg by mouth as needed.    . Diclofenac Sodium 3 % GEL Place onto the skin as needed.    [name] Kitchen esomeprazole ([name]) 40 MG capsule Take 40 mg by mouth daily at 12 noon.    . estrogens, conjugated, ([name]) 0.625 MG tablet Take 0.625 mg by mouth daily.      . magnesium oxide ([name]) 400 MG tablet Take 400 mg by mouth daily.    . ondansetron (ZOFRAN) 8 MG tablet Take 8 mg by mouth as needed for nausea or vomiting.    . ranitidine ([name]) 150 MG capsule Take 150 mg by mouth daily.     . SUMAtriptan ([name]) 50 MG tablet Take 50 mg by mouth every 2 (two) hours as needed for migraine. May repeat in 2 hours if headache persists or recurs.    . triamcinolone cream (KENALOG) 0.1 % Apply 1 application topically as needed.    [name] Kitchen [name] 70mg  injected monthly    .  albuterol ([name];[name]) 108 (90 Base) MCG/ACT inhaler Inhale 1-2 puffs into the lungs every 6 (six) hours as needed for wheezing or shortness of breath.     No current facility-administered medications for this visit.     Allergies:   Aspirin; Ibuprofen; Metoclopramide hcl; Sulfa antibiotics; Triptans; and Zithromax [azithromycin dihydrate]   Social History:  The patient  reports that she quit smoking about 27 years ago. Her smoking use included Cigarettes. She has a 1.25 pack-year smoking history. She has never used smokeless tobacco. She reports that she does not drink alcohol or use drugs.   Family History:  The patient's family history includes Diabetes in her father, maternal grandfather, mother,  and paternal grandfather; Heart attack in her father, maternal grandfather, and paternal grandfather; Hypertension in her father, maternal grandfather, and paternal grandfather.   Father - first MI at 93  ROS:  Please see the history of present illness.   All other systems are reviewed and negative.    PHYSICAL EXAM: VS:  BP 106/64   Pulse 96   Ht 5' 5.5" (1.664 m)   Wt 96.8 kg (213 lb 6.4 oz)   SpO2 98%   BMI 34.97 kg/m  , BMI Body mass index is 34.97 kg/m. GEN: Well nourished, well developed, in no acute distress  HEENT: normal  Neck: no JVD, no masses. No carotid bruits Cardiac: RRR without murmur or gallop                Respiratory:  clear to auscultation bilaterally, normal work of breathing GI: soft, nontender, nondistended, + BS MS: no deformity or atrophy  Ext: no pretibial edema, pedal pulses 2+= bilaterally Skin: warm and dry, no rash Neuro:  Strength and sensation are intact Psych: euthymic mood, full affect  EKG:  EKG is not ordered today.  Recent Labs: 07/11/2016: Hemoglobin 12.8; Platelets 289 09/19/2016: ALT 12; BUN 15; Creatinine, Ser 0.98; Potassium 3.8; Pro B Natriuretic peptide (BNP) 45.0; Sodium 139   Lipid Panel  No results found for: CHOL, TRIG, HDL, CHOLHDL, VLDL, LDLCALC, LDLDIRECT    Wt Readings from Last 3 Encounters:  11/13/16 96.8 kg (213 lb 6.4 oz)  10/22/16 96.2 kg (212 lb)  09/19/16 97.1 kg (214 lb)     Cardiac Studies Reviewed: Echo 09/25/2016: ------------------------------------------------------------------- Left ventricle:  The cavity size was normal. Wall thickness was normal. Systolic function was normal. The estimated ejection fraction was in the range of 60% to 65%. Wall motion was normal; there were no regional wall motion abnormalities. Doppler parameters are consistent with abnormal left ventricular relaxation (grade 1 diastolic dysfunction).  ------------------------------------------------------------------- Aortic  valve:   Trileaflet; normal thickness leaflets. Mobility was not restricted.  Doppler:  Transvalvular velocity was within the normal range. There was no stenosis. There was no regurgitation.   ------------------------------------------------------------------- Aorta:  Ascending aorta: The ascending aorta was not dilated.  ------------------------------------------------------------------- Mitral valve:   Structurally normal valve.   Mobility was not restricted.  Doppler:  Transvalvular velocity was within the normal range. There was no evidence for stenosis. There was trivial regurgitation.    Peak gradient (D): 3 mm Hg.  ------------------------------------------------------------------- Left atrium:  The atrium was mildly dilated.  ------------------------------------------------------------------- Atrial septum:  There was a patent foramen ovale, positive bubble contrast study.  ------------------------------------------------------------------- Right ventricle:  The cavity size was normal. Wall thickness was normal. Systolic function was normal.  ------------------------------------------------------------------- Pulmonic valve:   Poorly visualized.  Structurally normal valve. Cusp separation was normal.  Doppler:  Transvalvular  velocity was within the normal range. There was no evidence for stenosis. There was no regurgitation.  ------------------------------------------------------------------- Tricuspid valve:   Structurally normal valve.    Doppler: Transvalvular velocity was within the normal range. There was trivial regurgitation.  ------------------------------------------------------------------- Pulmonary artery:   The main pulmonary artery was normal-sized. Systolic pressure was within the normal range.  ------------------------------------------------------------------- Right atrium:  The atrium was normal in  size.  ------------------------------------------------------------------- Pericardium:  There was no pericardial effusion.  ------------------------------------------------------------------- Systemic veins: Inferior vena cava: The vessel was normal in size.  CT Chest: FINDINGS: Cardiovascular: No significant vascular findings. Normal heart size. No pericardial effusion.  Mediastinum/Nodes: No enlarged mediastinal or axillary lymph nodes. Thyroid gland, trachea, and esophagus demonstrate no significant findings.  Lungs/Pleura: There appears to be a small arteriovenous malformation seen in the right costophrenic sulcus anteriorly, with enlarge feeding and draining veins. No other pulmonary parenchymal abnormality is noted. No definite nodule or mass is noted. No pleural effusion or pneumothorax.  Upper Abdomen: No acute abnormality.  Musculoskeletal: No chest wall mass or suspicious bone lesions identified.  IMPRESSION: Probable small arteriovenous malformation seen in right lung base. No other abnormality seen in the chest.  ASSESSMENT AND PLAN: 51 year-old woman with known pulmonary AVM and evidence of right-to-left shunt by positive saline microcavitation study (positive bubble study).   I reviewed the patient's echo study. There is clearly right-to-left shunting seen at 4 cardiac cycles. This could be indicative of a PFO but also would be expected in a patient who has a pulmonary AVM. On surface echo, the level of shunt is not clear and on careful review I cannot see any color flow evidence of a PFO. The only way to differentiate the specific etiology of shunt would be with a TEE. Since this patient has no other structural heart disease, no hx of stroke or TIA, and no evidence of right-sided cardiac enlargement, I don't think we need to pursue TEE at this point as it would not impact her management. I did counsel her about the potential increased risk for TIA/stroke  since she has known right-to-left shunting and is taking hormone replacement therapy with premarin. She will discuss further with her PCP and consider weaning off of premarin. Daily ASA would be another consideration but she is allergic.   All of her questions are answered today. I would be happy to see her back in the future if any other cardiac issues arise.   Current medicines are reviewed with the patient today.  The patient does not have concerns regarding medicines.  Labs/ tests ordered today include:  No orders of the defined types were placed in this encounter.   Disposition:   FU prn  Signed, Jody Mocha, MD  11/15/2016 9:00 [facility] [address], [place], [place]  [address] Phone: [phone]; Fax: [phone]

## 2016-11-13 ENCOUNTER — Ambulatory Visit (INDEPENDENT_AMBULATORY_CARE_PROVIDER_SITE_OTHER): Payer: 59 | Admitting: Cardiovascular Disease

## 2016-11-13 ENCOUNTER — Encounter: Payer: Self-pay | Admitting: Cardiovascular Disease

## 2016-11-13 VITALS — BP 106/64 | HR 96 | Ht 65.5 in | Wt 213.4 lb

## 2016-11-13 DIAGNOSIS — Q249 Congenital malformation of heart, unspecified: Secondary | ICD-10-CM | POA: Diagnosis not present

## 2016-11-13 NOTE — Patient Instructions (Signed)
Medication Instructions:  Your provider recommends that you continue on your current medications as directed. Please refer to the Current Medication list given to you today.    Labwork: None  Testing/Procedures: None  Follow-Up: Your provider recommends that you schedule a follow-up appointment AS NEEDED with Dr. Cooper.  Any Other Special Instructions Will Be Listed Below (If Applicable).     If you need a refill on your cardiac medications before your next appointment, please call your pharmacy.   

## 2016-11-21 DIAGNOSIS — R51 Headache: Secondary | ICD-10-CM | POA: Diagnosis not present

## 2016-11-21 DIAGNOSIS — G43119 Migraine with aura, intractable, without status migrainosus: Secondary | ICD-10-CM | POA: Diagnosis not present

## 2016-12-06 MED FILL — AIMOVIG 70 MG/ML SOAJ: 70 | 28 days supply | Qty: 1 | Fill #0

## 2017-01-03 MED FILL — AIMOVIG 70 MG/ML SOAJ: 70 | 28 days supply | Qty: 1 | Fill #1

## 2017-01-03 MED FILL — DICLOFENAC SOD EC 50 MG TAB: 50 | 15 days supply | Qty: 30 | Fill #0

## 2017-01-29 MED FILL — ESOMEPRAZOLE MAG DR 40 MG C: 40 | 90 days supply | Qty: 90 | Fill #0

## 2017-01-29 MED FILL — AIMOVIG 70 MG/ML SOAJ: 70 | 28 days supply | Qty: 1 | Fill #2

## 2017-01-30 ENCOUNTER — Other Ambulatory Visit: Payer: Self-pay | Admitting: Dermatology

## 2017-01-30 DIAGNOSIS — L814 Other melanin hyperpigmentation: Secondary | ICD-10-CM | POA: Diagnosis not present

## 2017-01-30 DIAGNOSIS — L820 Inflamed seborrheic keratosis: Secondary | ICD-10-CM | POA: Diagnosis not present

## 2017-01-30 DIAGNOSIS — D485 Neoplasm of uncertain behavior of skin: Secondary | ICD-10-CM | POA: Diagnosis not present

## 2017-02-07 DIAGNOSIS — R300 Dysuria: Secondary | ICD-10-CM | POA: Diagnosis not present

## 2017-02-07 MED FILL — CIPROFLOXACIN HCL 250 MG TA: 250 | 3 days supply | Qty: 6 | Fill #0

## 2017-02-26 MED FILL — DICLOFENAC SOD EC 50 MG TAB: 50 | 15 days supply | Qty: 30 | Fill #1

## 2017-02-27 MED FILL — AIMOVIG 70 MG/ML SOAJ: 70 | 28 days supply | Qty: 1 | Fill #3

## 2017-03-10 ENCOUNTER — Encounter: Payer: Self-pay | Admitting: Neurology

## 2017-03-10 ENCOUNTER — Ambulatory Visit: Payer: No Typology Code available for payment source | Admitting: Neurology

## 2017-03-10 VITALS — BP 108/70 | HR 98 | Ht 65.5 in | Wt 215.2 lb

## 2017-03-10 DIAGNOSIS — G43109 Migraine with aura, not intractable, without status migrainosus: Secondary | ICD-10-CM | POA: Diagnosis not present

## 2017-03-10 MED ORDER — ERENUMAB-AOOE 70 MG/ML ~~LOC~~ SOAJ: 140.0000 mg | SUBCUTANEOUS | 11 refills | Status: DC

## 2017-03-10 NOTE — Addendum Note (Signed)
Addended by: Clois Comber on: 03/10/2017 02:46 PM   Modules accepted: Orders

## 2017-03-10 NOTE — Patient Instructions (Signed)
Migraine Recommendations: 1.  We will increase [name] to 140mg  2.  Take the diclofenac 50mg  with sumatriptan 50mg . Use baclofen and Zofran if needed. 3.  Limit use of pain relievers to no more than 2 days out of the week.  These medications include acetaminophen, ibuprofen, triptans and narcotics.  This will help reduce risk of rebound headaches. 4.  Be aware of common food triggers such as processed sweets, processed foods with nitrites (such as deli meat, hot dogs, sausages), foods with MSG, alcohol (such as wine), chocolate, certain cheeses, certain fruits (dried fruits, bananas, some citrus fruit), vinegar, diet soda. 4.  Avoid caffeine 5.  Routine exercise 6.  Proper sleep hygiene 7.  Stay adequately hydrated with water 8.  Keep a headache diary. 9.  Maintain proper stress management. 10.  Do not skip meals. 11.  Consider supplements:  Magnesium citrate 400mg  to 600mg  daily, riboflavin 400mg , Coenzyme Q 10 100mg  three times daily 12.  Follow up in 3 months.

## 2017-03-10 NOTE — Progress Notes (Signed)
NEUROLOGY CONSULTATION NOTE  Jody Blackwell MRN: [id number] DOB: [birth year]  Referring provider: Lennie Odor, PA-C Primary care provider: Lennie Odor, PA-C, Mayra Neer, MD  Reason for consult:  migraine  HISTORY OF PRESENT ILLNESS: Jody Blackwell is a 52 year old female with depression, GERD, chronic pain syndrome [name] deficiency and history of hysterectomy in 1997 who presents for migraines.  History supplemented by PCP and prior neurologist notes.   Onset:  Early 30s.  Location:  right occipital Quality:  pounding, like head is going to explode Intensity:  Severe prior to [place].  Now moderate and sometimes severe. Aura:  Severe headache preceded by left tunnel vision for 15-20 minutes. Prodrome:  Generalized aches, tired Postdrome:  Tired, foggy, washed out Associated symptoms:  nausea, photophobia, phonophobia, osmophobia, blurred vision.  Left facial numbness when severe.   Duration:  Prior to [name], 2 days up to 2 weeks.  Now 4 hours with sumatriptan 50mg . Frequency:  Prior to [name], 20+ days per month.  Now 10 days per month (worse the week prior to next injection). Frequency of abortive medication: as needed Triggers/exacerbating factors:  Artificial sweetner, tea, raw onions, perfumes/lotions, skipped meals, decreased sleep. Relieving factors:  rest Activity:  Aggravates.  Cannot function about 2 to 3 days a month.  Past NSAIDS:  ibuprofen Past analgesics:  Tylenol Past abortive triptans:  Sumatriptan [place]/[name]/[name] (rapid heartbeat, flushing), [name] 5mg  NS (ineffective) Past muscle relaxants:  no Past anti-emetic:  no Past antihypertensive medications:  propranolol (initially effective for many years), verapamil Past antidepressant medications:  Nortriptyline, amitriptyline, escitalopram Past anticonvulsant medications:  Topiramate, gabapentin Past vitamins/Herbal/Supplements:  no Past antihistamines/decongestants:  no Other past  therapies:  Trigger point injections/nerve blocks  Rescue protocol:  Sumatriptan 50mg  earliest onset, repeat in 2 hours, baclofen, Zofran if needed, then diclofenac after a day if needed. Current NSAIDS:  Diclofenac 50mg  tablet Current analgesics:  no Current triptans:  Sumatriptan 50mg  Current anti-emetic:  Zofran 8mg  Current muscle relaxants:  Baclofen 10mg  Current anti-anxiolytic:  no Current sleep aide:  no Current Antihypertensive medications:  no Current Antidepressant medications:  no Current Anticonvulsant medications:  no Current anti-CGRP:  [name] 70mg  Current Vitamins/Herbal/Supplements:  magnesium 400mg , D Current Antihistamines/Decongestants:  no Other therapy:  no Hormone:  [name]  Caffeine:  2 cups coffee daily Alcohol:  occasionally Smoker:  no Diet:  Hydrates.  Limited soda Exercise:  Gym 3 days a week Depression/anxiety:  Stable.  History of depression Sleep hygiene:  Good except for hot flashes Family history of headache:  Mother, maternal grandmother  03/22/15: Sed Rate 3  PAST MEDICAL HISTORY: Past Medical History:  Diagnosis Date  . Arthritis    OA in hands, hip and feet  . Chronic sinusitis   . Complication of anesthesia   . Depression   . GERD (gastroesophageal reflux disease)   . Joint pain   . Migraine   . PONV (postoperative nausea and vomiting)     PAST SURGICAL HISTORY: Past Surgical History:  Procedure Laterality Date  . ABDOMINAL HYSTERECTOMY    . ANKLE RECONSTRUCTION Right 07/20/2015   Procedure: OPEN EXPLORATION OF LATERAL RIGHT PERONEUS BREVIS TENDON WITH REPAIR ;  Surgeon: Garald Balding, MD;  Location: [facility];  Service: Orthopedics;  Laterality: Right;  . CESAREAN SECTION     x 2  . CHOLECYSTECTOMY  2012   lap choli  . MASS EXCISION  06/03/2011   Procedure: EXCISION MASS;  Surgeon: Schuyler Amor, MD;  Location:  [facility];  Service: Orthopedics;  Laterality: Right;  EXCISION MASS RIGHT  MIDDLE FINGER   . NASAL SINUS SURGERY  2007, 2010    MEDICATIONS: Current Outpatient Medications on File Prior to Visit  Medication Sig Dispense Refill  . Erenumab-aooe ([name]) 70 MG/ML SOAJ Inject 70 mg into the skin every 30 (thirty) days.    [name] Kitchen albuterol ([name];[name]) 108 (90 Base) MCG/ACT inhaler Inhale 1-2 puffs into the lungs every 6 (six) hours as needed for wheezing or shortness of breath.    . baclofen ([name]) 10 MG tablet Take 10 mg by mouth 3 (three) times daily as needed for muscle spasms.    . Cholecalciferol ([name]) 2000 units CAPS Take 1 capsule by mouth daily.    . diclofenac (VOLTAREN) 50 MG EC tablet Take 50 mg by mouth as needed.    . Diclofenac Sodium 3 % GEL Place onto the skin as needed.    [name] Kitchen esomeprazole ([name]) 40 MG capsule Take 40 mg by mouth daily at 12 noon.    . estrogens, conjugated, ([name]) 0.625 MG tablet Take 0.625 mg by mouth daily.      . magnesium oxide ([name]) 400 MG tablet Take 400 mg by mouth daily.    . ondansetron (ZOFRAN) 8 MG tablet Take 8 mg by mouth as needed for nausea or vomiting.    . ranitidine ([name]) 150 MG capsule Take 150 mg by mouth daily.     . SUMAtriptan ([name]) 50 MG tablet Take 50 mg by mouth every 2 (two) hours as needed for migraine. May repeat in 2 hours if headache persists or recurs.    . triamcinolone cream (KENALOG) 0.1 % Apply 1 application topically as needed.    [name] Kitchen [name] 70mg  injected monthly     No current facility-administered medications on file prior to visit.     ALLERGIES: Allergies  Allergen Reactions  . Aspirin Hives  . Ibuprofen Hives  . 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 Sulfa Antibiotics   . Triptans Other (See Comments)    Sensitivity to injectable triptan drugs per pt  . Zithromax [Azithromycin Dihydrate]     FAMILY HISTORY: Family History  Problem Relation Age of Onset  . Diabetes Mother   . Diabetes Father   .  Heart attack Father   . Hypertension Father   . Diabetes Maternal Grandfather   . Heart attack Maternal Grandfather   . Hypertension Maternal Grandfather   . Diabetes Paternal Grandfather   . Heart attack Paternal Grandfather   . Hypertension Paternal Grandfather   . High blood pressure Brother     SOCIAL HISTORY: Social History   Socioeconomic History  . Marital status: Married    Spouse name: Clair Gulling  . Number of children: 3  . Years of education: Not on file  . Highest education level: Master's degree (e.g., MA, MS, MEng, MEd, MSW, MBA)  Social Needs  . Financial resource strain: Not on file  . Food insecurity - worry: Not on file  . Food insecurity - inability: Not on file  . Transportation needs - medical: Not on file  . Transportation needs - non-medical: Not on file  Occupational History  . Occupation: Herbalist: [employer]  Tobacco Use  . Smoking status: Former Smoker    Packs/day: 0.25    Years: 5.00    Pack years: 1.25    Types: Cigarettes  Last attempt to quit: 05/30/1989    Years since quitting: 27.7  . Smokeless tobacco: Never Used  . Tobacco comment: social smoker per pt  Substance and Sexual Activity  . Alcohol use: Yes    Comment: occasionally  . Drug use: No  . Sexual activity: Not on file  Other Topics Concern  . Not on file  Social History Narrative   Married, lives with husband. Drinks 2 cups of coffee a day, an occasional soda. Exercises 3 X a week.    REVIEW OF SYSTEMS: Constitutional: No fevers, chills, or sweats, no generalized fatigue, change in appetite Eyes: No visual changes, double vision, eye pain Ear, nose and throat: No hearing loss, ear pain, nasal congestion, sore throat Cardiovascular: No chest pain, palpitations Respiratory:  No shortness of breath at rest or with exertion, wheezes GastrointestinaI: No nausea, vomiting, diarrhea, abdominal pain, fecal incontinence Genitourinary:  No dysuria, urinary retention  or frequency Musculoskeletal:  No neck pain, back pain Integumentary: No rash, pruritus, skin lesions Neurological: as above Psychiatric: No depression, insomnia, anxiety Endocrine: No palpitations, fatigue, diaphoresis, mood swings, change in appetite, change in weight, increased thirst Hematologic/Lymphatic:  No purpura, petechiae. Allergic/Immunologic: no itchy/runny eyes, nasal congestion, recent allergic reactions, rashes  PHYSICAL EXAM: Vitals:   03/10/17 0758  BP: 108/70  Pulse: 98  SpO2: 98%   General: No acute distress.  Patient appears well-groomed.  Head:  Normocephalic/atraumatic Eyes:  fundi examined but not visualized Neck: supple, no paraspinal tenderness, full range of motion Back: No paraspinal tenderness Heart: regular rate and rhythm Lungs: Clear to auscultation bilaterally. Vascular: No carotid bruits. Neurological Exam: Mental status: alert and oriented to person, place, and time, recent and remote memory intact, fund of knowledge intact, attention and concentration intact, speech fluent and not dysarthric, language intact. Cranial nerves: CN I: not tested CN II: pupils equal, round and reactive to light, visual fields intact CN III, IV, VI:  full range of motion, no nystagmus, no ptosis CN V: facial sensation intact CN VII: upper and lower face symmetric CN VIII: hearing intact CN IX, X: gag intact, uvula midline CN XI: sternocleidomastoid and trapezius muscles intact CN XII: tongue midline Bulk & Tone: normal, no fasciculations. Motor:  5/5 throughout  Sensation: temperature and vibration sensation intact. Deep Tendon Reflexes:  2+ throughout, toes downgoing.  Finger to nose testing:  Without dysmetria.  Heel to shin:  Without dysmetria.   Gait:  Normal station and stride.  Able to turn and tandem walk. Romberg negative.  IMPRESSION: Migraine with and without aura, improved since initiation of [name].  PLAN: 1.  To try and reduce frequency  further, increase [name] to 140mg  monthly 2.  For abortive therapy, advised to take the diclofenac with first dose of sumatriptan, earliest onset. 3.  In addition to magnesium, consider riboflavin 400mg  and CoQ10 100mg  three times daily 4.  Continue exercise, hydration. 5.  Follow up in 3 months.  Thank you for allowing me to take part in the care of this patient.  Metta Clines, DO  CC:  Lennie Odor, PA-C  Mayra Neer, MD

## 2017-03-17 MED FILL — SUMATRIPTAN SUCC 50 MG TABL: 50 | 30 days supply | Qty: 18 | Fill #1

## 2017-03-20 NOTE — Progress Notes (Signed)
Rcvd fax from [place] rqstng PA on [name], Pt was recently increased to 140mg , the 70mg  per month was already apprvd. Called [phone], spoke with Iona Beard, gave clinicals and reasoning for increase. Will receive fax with determination within 24-48hrs. Called [facility], LMOVM

## 2017-03-27 MED FILL — AIMOVIG 140 DOSE 70 MG/ML S: 70 | 28 days supply | Qty: 2 | Fill #0

## 2017-03-28 NOTE — Progress Notes (Signed)
Rcvd approval for 140 mg [name] [place] reference # [id number] valid for 12 fills 03/20/17-03/19/2018

## 2017-04-10 ENCOUNTER — Telehealth: Payer: No Typology Code available for payment source | Admitting: Family

## 2017-04-10 DIAGNOSIS — B9689 Other specified bacterial agents as the cause of diseases classified elsewhere: Secondary | ICD-10-CM

## 2017-04-10 DIAGNOSIS — J028 Acute pharyngitis due to other specified organisms: Secondary | ICD-10-CM

## 2017-04-10 MED ORDER — BENZONATATE 100 MG PO CAPS: 100.0000 mg | ORAL_CAPSULE | Freq: Three times a day (TID) | ORAL | 0 refills | Status: DC | PRN

## 2017-04-10 MED ORDER — DOXYCYCLINE HYCLATE 100 MG PO TABS: 100.0000 mg | ORAL_TABLET | Freq: Two times a day (BID) | ORAL | 0 refills | Status: DC

## 2017-04-10 MED FILL — DOXYCYCLINE HYCLATE 100 MG: 100 | 7 days supply | Qty: 14 | Fill #0

## 2017-04-10 MED FILL — BENZONATATE 100 MG CAPS: 100 | 5 days supply | Qty: 30 | Fill #0

## 2017-04-10 NOTE — Progress Notes (Signed)
Thank you for the details you included in the comment boxes. Those details are very helpful in determining the best course of treatment for you and help us to provide the best care.  We are sorry that you are not feeling well.  Here is how we plan to help!  Based on your presentation I believe you most likely have A cough due to bacteria.  When patients have a fever and a productive cough with a change in color or increased sputum production, we are concerned about bacterial bronchitis.  If left untreated it can progress to pneumonia.  If your symptoms do not improve with your treatment plan it is important that you contact your provider.   I have prescribed Doxycycline 100 mg twice a day for 7 days     In addition you may use A non-prescription cough medication called Mucinex DM: take 2 tablets every 12 hours. and A prescription cough medication called Tessalon Perles 100mg. You may take 1-2 capsules every 8 hours as needed for your cough.   From your responses in the eVisit questionnaire you describe inflammation in the upper respiratory tract which is causing a significant cough.  This is commonly called Bronchitis and has four common causes:    Allergies  Viral Infections  Acid Reflux  Bacterial Infection Allergies, viruses and acid reflux are treated by controlling symptoms or eliminating the cause. An example might be a cough caused by taking certain blood pressure medications. You stop the cough by changing the medication. Another example might be a cough caused by acid reflux. Controlling the reflux helps control the cough.  USE OF BRONCHODILATOR ("RESCUE") INHALERS: There is a risk from using your bronchodilator too frequently.  The risk is that over-reliance on a medication which only relaxes the muscles surrounding the breathing tubes can reduce the effectiveness of medications prescribed to reduce swelling and congestion of the tubes themselves.  Although you feel brief relief from  the bronchodilator inhaler, your asthma may actually be worsening with the tubes becoming more swollen and filled with mucus.  This can delay other crucial treatments, such as oral steroid medications. If you need to use a bronchodilator inhaler daily, several times per day, you should discuss this with your provider.  There are probably better treatments that could be used to keep your asthma under control.     HOME CARE . Only take medications as instructed by your medical team. . Complete the entire course of an antibiotic. . Drink plenty of fluids and get plenty of rest. . Avoid close contacts especially the very young and the elderly . Cover your mouth if you cough or cough into your sleeve. . Always remember to wash your hands . A steam or ultrasonic humidifier can help congestion.   GET HELP RIGHT AWAY IF: . You develop worsening fever. . You become short of breath . You cough up blood. . Your symptoms persist after you have completed your treatment plan MAKE SURE YOU   Understand these instructions.  Will watch your condition.  Will get help right away if you are not doing well or get worse.  Your e-visit answers were reviewed by a board certified advanced clinical practitioner to complete your personal care plan.  Depending on the condition, your plan could have included both over the counter or prescription medications. If there is a problem please reply  once you have received a response from your provider. Your safety is important to us.  If you have   drug allergies check your prescription carefully.    You can use MyChart to ask questions about today's visit, request a non-urgent call back, or ask for a work or school excuse for 24 hours related to this e-Visit. If it has been greater than 24 hours you will need to follow up with your provider, or enter a new e-Visit to address those concerns. You will get an e-mail in the next two days asking about your experience.  I hope  that your e-visit has been valuable and will speed your recovery. Thank you for using e-visits.   

## 2017-04-24 MED FILL — AIMOVIG 140 DOSE 70 MG/ML S: 70 | 28 days supply | Qty: 2 | Fill #1

## 2017-04-30 MED FILL — ESOMEPRAZOLE MAG DR 40 MG C: 40 | 90 days supply | Qty: 90 | Fill #1

## 2017-05-21 ENCOUNTER — Telehealth: Payer: No Typology Code available for payment source | Admitting: Family

## 2017-05-21 DIAGNOSIS — J019 Acute sinusitis, unspecified: Secondary | ICD-10-CM | POA: Diagnosis not present

## 2017-05-21 MED ORDER — BENZONATATE 100 MG PO CAPS: 100.0000 mg | ORAL_CAPSULE | Freq: Three times a day (TID) | ORAL | 0 refills | Status: DC | PRN

## 2017-05-21 MED ORDER — AMOXICILLIN-POT CLAVULANATE 875-125 MG PO TABS: 1.0000 | ORAL_TABLET | Freq: Two times a day (BID) | ORAL | 0 refills | Status: DC

## 2017-05-21 MED FILL — BENZONATATE 100 MG CAPS: 100 | 6 days supply | Qty: 20 | Fill #0

## 2017-05-21 MED FILL — ESCITALOPRAM 10 MG TABLET: 10 | 90 days supply | Qty: 90 | Fill #0

## 2017-05-21 MED FILL — AIMOVIG 140 DOSE 70 MG/ML S: 70 | 28 days supply | Qty: 2 | Fill #2

## 2017-05-21 MED FILL — AMOXICILLIN-POT CLAVULANATE: 875-125 | 7 days supply | Qty: 14 | Fill #0

## 2017-05-21 NOTE — Progress Notes (Signed)
We are sorry that you are not feeling well.  Here is how we plan to help!  Based on what you have shared with me it looks like you have sinusitis.  Sinusitis is inflammation and infection in the sinus cavities of the head.  Based on your presentation I believe you most likely have Acute Bacterial Sinusitis.  This is an infection caused by bacteria and is treated with antibiotics. I have prescribed Augmentin 875mg /125mg  one tablet twice daily with food, for 7 days. You may use an oral decongestant such as Mucinex D or if you have glaucoma or high blood pressure use plain Mucinex. Saline nasal spray help and can safely be used as often as needed for congestion.  If you develop worsening sinus pain, fever or notice severe headache and vision changes, or if symptoms are not better after completion of antibiotic, please schedule an appointment with a health care provider.    I also sent in  Tessalon Prescription sent to pharmacy.   Sinus infections are not as easily transmitted as other respiratory infection, however we still recommend that you avoid close contact with loved ones, especially the very young and elderly.  Remember to wash your hands thoroughly throughout the day as this is the number one way to prevent the spread of infection!  Home Care:  Only take medications as instructed by your medical team.  Complete the entire course of an antibiotic.  Do not take these medications with alcohol.  A steam or ultrasonic humidifier can help congestion.  You can place a towel over your head and breathe in the steam from hot water coming from a faucet.  Avoid close contacts especially the very young and the elderly.  Cover your mouth when you cough or sneeze.  Always remember to wash your hands.  Get Help Right Away If:  You develop worsening fever or sinus pain.  You develop a severe head ache or visual changes.  Your symptoms persist after you have completed your treatment plan.  Make  sure you  Understand these instructions.  Will watch your condition.  Will get help right away if you are not doing well or get worse.  Your e-visit answers were reviewed by a board certified advanced clinical practitioner to complete your personal care plan.  Depending on the condition, your plan could have included both over the counter or prescription medications.  If there is a problem please reply  once you have received a response from your provider.  Your safety is important to [place].  If you have drug allergies check your prescription carefully.    You can use MyChart to ask questions about today's visit, request a non-urgent call back, or ask for a work or school excuse for 24 hours related to this e-Visit. If it has been greater than 24 hours you will need to follow up with your provider, or enter a new e-Visit to address those concerns.  You will get an e-mail in the next two days asking about your experience.  I hope that your e-visit has been valuable and will speed your recovery. Thank you for using e-visits.

## 2017-06-02 ENCOUNTER — Encounter: Payer: Self-pay | Admitting: Neurology

## 2017-06-02 ENCOUNTER — Other Ambulatory Visit: Payer: Self-pay

## 2017-06-02 MED ORDER — PREDNISONE 10 MG (21) PO TBPK: ORAL_TABLET | ORAL | 0 refills | Status: DC

## 2017-06-02 MED FILL — predniSONE 10 MG TABS: 10 | 6 days supply | Qty: 21 | Fill #0

## 2017-06-02 MED FILL — DICLOFENAC SOD EC 50 MG TAB: 50 | 15 days supply | Qty: 30 | Fill #0

## 2017-06-04 ENCOUNTER — Encounter: Payer: Self-pay | Admitting: Neurology

## 2017-06-04 ENCOUNTER — Encounter: Payer: Self-pay | Admitting: Pulmonary Disease

## 2017-06-10 ENCOUNTER — Ambulatory Visit (INDEPENDENT_AMBULATORY_CARE_PROVIDER_SITE_OTHER): Payer: No Typology Code available for payment source | Admitting: Neurology

## 2017-06-10 ENCOUNTER — Encounter: Payer: Self-pay | Admitting: Neurology

## 2017-06-10 VITALS — BP 100/70 | HR 92 | Ht 65.5 in | Wt 213.2 lb

## 2017-06-10 DIAGNOSIS — G43119 Migraine with aura, intractable, without status migrainosus: Secondary | ICD-10-CM | POA: Diagnosis not present

## 2017-06-10 NOTE — Patient Instructions (Signed)
1.  Continue [name] 140mg  injection monthly 2.  Use sumatriptan with diclofenac for acute headache attack, limit to no more than 2 days out of week 3.  Keep headache diary 4.  If you still have a headache in 2 weeks, contact me and we can retry topiramate as an add-on medication 5.  Follow up in 3 months.

## 2017-06-10 NOTE — Progress Notes (Signed)
NEUROLOGY FOLLOW UP OFFICE NOTE  Jody Blackwell [id number]  HISTORY OF PRESENT ILLNESS: Jody Blackwell is a 52 year old female with depression, GERD, chronic pain syndrome [name] deficiency and history of hysterectomy in 1997 who follows up for migraine with aura.  UPDATE: [name] 70mg  monthly reduced frequency of headaches from over 20 days per month to 10 days per month.  Last visit, he increased dose to 140mg  monthly to try and further reduce frequency.  She has had improvement.  They are less intense (moderate to severe), last 2 to 3 hours with combination of sumatriptan and diclofenac and were only occurring 2 days a week.  However, about 2 weeks ago, she developed an intractable migraine that wouldn't break.  Change in weather was a possible trigger as she has had a flare-up of her fibromyalgia as well.  She was prescribed a prednisone taper on 06/02/17 for an intractable migraine since 4/5.  She was headache-free for about 2 days but then she developed a dull constant headache.  It has steadily been improving.  Rescue protocol:  Sumatriptan 50mg  with dicolfenac earliest onset, repeat in 2 hours, baclofen, Zofran if needed Current NSAIDS:  Diclofenac 50mg  tablet Current analgesics:  no Current triptans:  Sumatriptan 50mg  Current anti-emetic:  Zofran 8mg  Current muscle relaxants:  Baclofen 10mg  Current anti-anxiolytic:  no Current sleep aide:  no Current Antihypertensive medications:  no Current Antidepressant medications:  no Current Anticonvulsant medications:  no Current anti-CGRP:  [name] 140mg  Current Vitamins/Herbal/Supplements:  magnesium 400mg , D Current Antihistamines/Decongestants:  no Other therapy:  no Hormone:  [name]   Caffeine:  2 cups coffee daily Alcohol:  occasionally Smoker:  no Diet:  Hydrates.  Limited soda Exercise:  Gym 3 days a week Depression/anxiety:  Stable.  History of depression Sleep hygiene:  Good except for hot  flashes  HISTORY: Onset:  Early 46s.  Location:  right occipital Quality:  pounding, like head is going to explode Initial Intensity:  Severe prior to [place].  Now moderate and sometimes severe. Aura:  Severe headache preceded by left tunnel vision for 15-20 minutes. Prodrome:  Generalized aches, tired Postdrome:  Tired, foggy, washed out Associated symptoms:  nausea, photophobia, phonophobia, osmophobia, blurred vision.  Left facial numbness when severe.   Initial Duration:  Prior to [name], 2 days up to 2 weeks.  Now 4 hours with sumatriptan 50mg . Initial Frequency:  Prior to [name], 20+ days per month.  Now 10 days per month (worse the week prior to next injection). Initial Frequency of abortive medication: as needed Triggers/exacerbating factors:  Artificial sweetner, tea, raw onions, perfumes/lotions, skipped meals, decreased sleep. Relieving factors:  rest Activity:  Aggravates.  Cannot function about 2 to 3 days a month.   Past NSAIDS:  ibuprofen Past analgesics:  Tylenol Past abortive triptans:  Sumatriptan [place]/[name]/[name] (rapid heartbeat, flushing), [name] 5mg  NS (ineffective) Past muscle relaxants:  no Past anti-emetic:  no Past antihypertensive medications:  propranolol (initially effective for many years), verapamil Past antidepressant medications:  Nortriptyline, amitriptyline, escitalopram Past anticonvulsant medications:  Topiramate, gabapentin Past vitamins/Herbal/Supplements:  no Past antihistamines/decongestants:  no Other past therapies:  Trigger point injections/nerve blocks   Family history of headache:  Mother, maternal grandmother   03/22/15: Sed Rate 3  PAST MEDICAL HISTORY: Past Medical History:  Diagnosis Date  . Arthritis    OA in hands, hip and feet  . Chronic sinusitis   . Complication of anesthesia   . Depression   . GERD (gastroesophageal reflux disease)   .  Joint pain   . Migraine   . PONV (postoperative nausea and vomiting)      MEDICATIONS: Current Outpatient Medications on File Prior to Visit  Medication Sig Dispense Refill  . albuterol ([name];[name]) 108 (90 Base) MCG/ACT inhaler Inhale 1-2 puffs into the lungs every 6 (six) hours as needed for wheezing or shortness of breath.    . baclofen ([name]) 10 MG tablet Take 10 mg by mouth 3 (three) times daily as needed for muscle spasms.    . Cholecalciferol ([name]) 2000 units CAPS Take 1 capsule by mouth daily.    . diclofenac (VOLTAREN) 50 MG EC tablet Take 50 mg by mouth as needed.    . Diclofenac Sodium 3 % GEL Place onto the skin as needed.    [name] ([name] 140 DOSE) 70 MG/ML SOAJ Inject 140 mg into the skin every 28 (twenty-eight) days. 2 pen 11  . Erenumab-aooe ([name]) 70 MG/ML SOAJ Inject 70 mg into the skin every 30 (thirty) days.    [name] Kitchen esomeprazole ([name]) 40 MG capsule Take 40 mg by mouth daily at 12 noon.    . estrogens, conjugated, ([name]) 0.625 MG tablet Take 0.625 mg by mouth daily.      . magnesium oxide ([name]) 400 MG tablet Take 400 mg by mouth daily.    . ondansetron (ZOFRAN) 8 MG tablet Take 8 mg by mouth as needed for nausea or vomiting.    . ranitidine ([name]) 150 MG capsule Take 150 mg by mouth daily.     . SUMAtriptan ([name]) 50 MG tablet Take 50 mg by mouth every 2 (two) hours as needed for migraine. May repeat in 2 hours if headache persists or recurs.    . triamcinolone cream (KENALOG) 0.1 % Apply 1 application topically as needed.    [name] Kitchen [name] 70mg  injected monthly     No current facility-administered medications on file prior to visit.     ALLERGIES: Allergies  Allergen Reactions  . Aspirin Hives  . Ibuprofen Hives  . 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 Sulfa Antibiotics   . Triptans Other (See Comments)    Sensitivity to injectable triptan drugs per pt  . Zithromax [Azithromycin Dihydrate]     FAMILY HISTORY: Family History  Problem  Relation Age of Onset  . Diabetes Mother   . Diabetes Father   . Heart attack Father   . Hypertension Father   . Diabetes Maternal Grandfather   . Heart attack Maternal Grandfather   . Hypertension Maternal Grandfather   . Diabetes Paternal Grandfather   . Heart attack Paternal Grandfather   . Hypertension Paternal Grandfather   . High blood pressure Brother     SOCIAL HISTORY: Social History   Socioeconomic History  . Marital status: Married    Spouse name: Clair Gulling  . Number of children: 3  . Years of education: Not on file  . Highest education level: Master's degree (e.g., MA, MS, MEng, MEd, MSW, MBA)  Occupational History  . Occupation: Herbalist: [employer]  . Financial resource strain: Not on file  . Food insecurity:    Worry: Not on file    Inability: Not on file  . Transportation needs:    Medical: Not on file    Non-medical: Not on file  Tobacco Use  . Smoking status: Former Smoker    Packs/day: 0.25    Years:  5.00    Pack years: 1.25    Types: Cigarettes    Last attempt to quit: 05/30/1989    Years since quitting: 28.0  . Smokeless tobacco: Never Used  . Tobacco comment: social smoker per pt  Substance and Sexual Activity  . Alcohol use: Yes    Comment: occasionally  . Drug use: No  . Sexual activity: Not on file  Lifestyle  . Physical activity:    Days per week: Not on file    Minutes per session: Not on file  . Stress: Not on file  Relationships  . Social connections:    Talks on phone: Not on file    Gets together: Not on file    Attends religious service: Not on file    Active member of club or organization: Not on file    Attends meetings of clubs or organizations: Not on file    Relationship status: Not on file  . Intimate partner violence:    Fear of current or ex partner: Not on file    Emotionally abused: Not on file    Physically abused: Not on file    Forced sexual activity: Not on file  Other Topics  Concern  . Not on file  Social History Narrative   Married, lives with husband. Drinks 2 cups of coffee a day, an occasional soda. Exercises 3 X a week.    REVIEW OF SYSTEMS: Constitutional: No fevers, chills, or sweats, no generalized fatigue, change in appetite Eyes: No visual changes, double vision, eye pain Ear, nose and throat: No hearing loss, ear pain, nasal congestion, sore throat Cardiovascular: No chest pain, palpitations Respiratory:  No shortness of breath at rest or with exertion, wheezes GastrointestinaI: No nausea, vomiting, diarrhea, abdominal pain, fecal incontinence Genitourinary:  No dysuria, urinary retention or frequency Musculoskeletal:  No neck pain, back pain Integumentary: No rash, pruritus, skin lesions Neurological: as above Psychiatric: No depression, insomnia, anxiety Endocrine: No palpitations, fatigue, diaphoresis, mood swings, change in appetite, change in weight, increased thirst Hematologic/Lymphatic:  No purpura, petechiae. Allergic/Immunologic: no itchy/runny eyes, nasal congestion, recent allergic reactions, rashes  PHYSICAL EXAM: Vitals:   06/10/17 0735  BP: 100/70  Pulse: 92  SpO2: 97%   General: No acute distress.  Patient appears well-groomed.   Head:  Normocephalic/atraumatic Eyes:  Fundi examined but not visualized Neck: supple, no paraspinal tenderness, full range of motion Heart:  Regular rate and rhythm Lungs:  Clear to auscultation bilaterally Back: No paraspinal tenderness Neurological Exam: alert and oriented to person, place, and time. Attention span and concentration intact, recent and remote memory intact, fund of knowledge intact.  Speech fluent and not dysarthric, language intact.  CN II-XII intact. Bulk and tone normal, muscle strength 5/5 throughout.  Sensation to light touch  intact.  Deep tendon reflexes 2+ throughout.  Finger to nose testing intact.  Gait normal, Romberg negative.[name] Kitchen  IMPRESSION: Migraine with aura,  intractable, without status migrainosus.  The severe headache has resolved but still with nagging headache.  PLAN: 1.  As headache is steadily improving, we will see how she does over next two weeks.  If headache persists, we will retry topiramate as an add-on medication to [place].  She did fairly well with topiramate as monotherapy in the past but said it just lost efficacy.  It may be more effective now as an add-on. 2.  Sumatriptan 50mg  with diclofenac 50mg  for abortive therapy, limit to no more than 2 days out of week to prevent rebound  headache 3.  Headache diary 4.  Follow up in 3 months.  Metta Clines, DO  CC:  Mayra Neer, MD

## 2017-06-11 ENCOUNTER — Ambulatory Visit: Payer: No Typology Code available for payment source | Admitting: Pulmonary Disease

## 2017-06-12 MED FILL — PREMARIN VAGINAL CREAM-APPL: 0.625 | 30 days supply | Qty: 30 | Fill #0

## 2017-06-17 ENCOUNTER — Encounter: Payer: Self-pay | Admitting: Neurology

## 2017-06-17 MED FILL — SUMATRIPTAN SUCC 50 MG TAB: 50 | 10 days supply | Qty: 4 | Fill #2

## 2017-06-17 MED FILL — AIMOVIG 140 DOSE 70 MG/ML S: 70 | 28 days supply | Qty: 2 | Fill #3

## 2017-06-19 ENCOUNTER — Telehealth: Payer: Self-pay | Admitting: Pulmonary Disease

## 2017-06-19 NOTE — Telephone Encounter (Signed)
Spoke to pt & made her aware we got the prior auth.  Nothing further needed.

## 2017-06-19 NOTE — Telephone Encounter (Signed)
Called [facility] [employer] & got the prior auth.  Called pt & left her message to call me back.

## 2017-06-20 ENCOUNTER — Encounter: Payer: Self-pay | Admitting: Neurology

## 2017-06-23 ENCOUNTER — Encounter: Payer: Self-pay | Admitting: Neurology

## 2017-06-24 ENCOUNTER — Other Ambulatory Visit: Payer: Self-pay

## 2017-06-24 MED ORDER — TOPIRAMATE 50 MG PO TABS: 50.0000 mg | ORAL_TABLET | Freq: Every day | ORAL | 1 refills | Status: DC

## 2017-06-24 MED FILL — TOPIRAMATE 50 MG TABLET: 50 | 30 days supply | Qty: 30 | Fill #0

## 2017-06-26 ENCOUNTER — Encounter (HOSPITAL_BASED_OUTPATIENT_CLINIC_OR_DEPARTMENT_OTHER): Payer: Self-pay

## 2017-06-26 ENCOUNTER — Ambulatory Visit (HOSPITAL_BASED_OUTPATIENT_CLINIC_OR_DEPARTMENT_OTHER)
Admission: RE | Admit: 2017-06-26 | Discharge: 2017-06-26 | Disposition: A | Discharge status: Home / Self Care | Payer: No Typology Code available for payment source | Source: Ambulatory Visit | Attending: Pulmonary Disease | Admitting: Pulmonary Disease

## 2017-06-26 ENCOUNTER — Telehealth: Payer: Self-pay | Admitting: Pulmonary Disease

## 2017-06-26 DIAGNOSIS — Q211 Atrial septal defect: Secondary | ICD-10-CM | POA: Insufficient documentation

## 2017-06-26 DIAGNOSIS — Q2572 Congenital pulmonary arteriovenous malformation: Secondary | ICD-10-CM | POA: Insufficient documentation

## 2017-06-26 DIAGNOSIS — Q2112 Patent foramen ovale: Secondary | ICD-10-CM

## 2017-06-26 MED ORDER — IOPAMIDOL (ISOVUE-370) INJECTION 76%
100.0000 mL | Freq: Once | INTRAVENOUS | Status: AC | PRN
  Administered 2017-06-26: 100 mL via INTRAVENOUS

## 2017-06-26 NOTE — Telephone Encounter (Signed)
Notes recorded by Juanito Doom, MD on 06/26/2017 at 4:07 PM EDT A, Please let the patient know this was OK Thanks, B --------- Spoke with pt, aware of results/recs.  Nothing further needed.

## 2017-06-27 ENCOUNTER — Other Ambulatory Visit: Payer: Self-pay | Admitting: Physician Assistant

## 2017-06-27 DIAGNOSIS — Z1231 Encounter for screening mammogram for malignant neoplasm of breast: Secondary | ICD-10-CM

## 2017-07-08 ENCOUNTER — Ambulatory Visit
Admission: RE | Admit: 2017-07-08 | Discharge: 2017-07-08 | Disposition: A | Discharge status: Home / Self Care | Payer: No Typology Code available for payment source | Source: Ambulatory Visit | Attending: Physician Assistant | Admitting: Physician Assistant

## 2017-07-08 DIAGNOSIS — Z1231 Encounter for screening mammogram for malignant neoplasm of breast: Secondary | ICD-10-CM

## 2017-07-10 MED FILL — TRIAMCINOLONE ACETONIDE 0.1: 0.1 | 20 days supply | Qty: 60 | Fill #0

## 2017-07-14 MED FILL — ESOMEPRAZOLE MAG DR 40 MG C: 40 | 90 days supply | Qty: 90 | Fill #0

## 2017-07-16 MED FILL — SUMAtriptan SUCCINATE 50 MG: 50 | 30 days supply | Qty: 10 | Fill #0

## 2017-07-16 MED FILL — TOPIRAMATE 50 MG TABLET: 50 | 30 days supply | Qty: 30 | Fill #1

## 2017-07-16 MED FILL — AIMOVIG 140 MG/ML SOAJ: 140 | 28 days supply | Qty: 1 | Fill #0 | Status: TO

## 2017-07-16 MED FILL — DICLOFENAC SOD EC 50 MG TAB: 50 | 15 days supply | Qty: 30 | Fill #1

## 2017-07-22 ENCOUNTER — Encounter: Payer: Self-pay | Admitting: Neurology

## 2017-07-29 ENCOUNTER — Ambulatory Visit: Payer: No Typology Code available for payment source | Admitting: Pulmonary Disease

## 2017-08-06 ENCOUNTER — Encounter: Payer: Self-pay | Admitting: Neurology

## 2017-08-07 ENCOUNTER — Other Ambulatory Visit: Payer: Self-pay

## 2017-08-07 MED ORDER — TOPIRAMATE 50 MG PO TABS: 50.0000 mg | ORAL_TABLET | Freq: Every day | ORAL | 0 refills | Status: DC

## 2017-08-07 MED ORDER — TOPIRAMATE 100 MG PO TABS: 100.0000 mg | ORAL_TABLET | Freq: Every day | ORAL | 0 refills | Status: DC

## 2017-08-07 MED FILL — TOPIRAMATE 100 MG TABLET: 100 | 90 days supply | Qty: 90 | Fill #0

## 2017-08-14 MED FILL — AIMOVIG 140 MG/ML SOAJ: 140 | 28 days supply | Qty: 1 | Fill #0

## 2017-08-15 MED FILL — ESCITALOPRAM 10 MG TABLET: 10 | 90 days supply | Qty: 90 | Fill #0

## 2017-09-23 ENCOUNTER — Ambulatory Visit: Payer: No Typology Code available for payment source | Admitting: Neurology

## 2017-09-25 ENCOUNTER — Encounter: Payer: Self-pay | Admitting: Neurology

## 2017-09-26 ENCOUNTER — Other Ambulatory Visit: Payer: Self-pay

## 2017-09-26 ENCOUNTER — Telehealth: Payer: Self-pay | Admitting: Neurology

## 2017-09-26 MED ORDER — ERENUMAB-AOOE 140 MG/ML ~~LOC~~ SOAJ: 140.0000 mg | SUBCUTANEOUS | 11 refills | Status: DC

## 2017-09-26 NOTE — Telephone Encounter (Signed)
Patient called and is needing to have a prior Auth for her [name]? She has a [employer] now, which requires a Prior Auth. She said she went on My Chart and uploaded her new Insurance Cards. Please Cal. Thanks

## 2017-09-26 NOTE — Progress Notes (Signed)
Initiated on cover my meds,  VKF:[id number]

## 2017-09-30 NOTE — Telephone Encounter (Signed)
Patient called again and left a voicemail to check on a prior authorization for her aimovig medication. Please call her at [phone]. Thanks.

## 2017-10-01 NOTE — Telephone Encounter (Signed)
Called and spoke with Pt, advised her PA was started 09/26/17, I resent it today. I advised Pt she could come get sample. She will come by tomorrow.

## 2017-10-01 NOTE — Telephone Encounter (Signed)
Patient is calling back to find out about the auth for her aimovig  Please call

## 2017-10-02 NOTE — Progress Notes (Signed)
[name] apprvd  10/01/17 - 10/02/18  Called Pt and advised

## 2017-10-06 NOTE — Progress Notes (Signed)
NEUROLOGY FOLLOW UP OFFICE NOTE  Jody Blackwell [id number]  HISTORY OF PRESENT ILLNESS: Jody Blackwell is a 51 year old female with depression, GERD, chronic pain syndrome, [name] deficiency and history of hysterectomy in 1997 who follows up for migraine with aura.  UPDATE: Headaches were well controlled on [name] until April.  Topiramate was restarted in addition to [place].  Headaches improved somewhat but then she changed jobs in July so her [name] shot was delayed by 2 weeks.  She feels like she is in a fog on topiramate (word-finding difficulty).  Prednisone taper has not been helpful. Intensity:  severe Duration:  2-3 days Frequency:  20 days a month She has had a constant headache for the past 3 weeks. Frequency of abortive medication: Almost daily (Tylenol mostly) Rescue therapy:  Sumatriptan 50mg  with diclofenac 50mg  Current NSAIDS:  Diclofenac 50mg  Current analgesics:  Tylenol Current triptans:  Sumatriptan 50mg  Current ergotamine:  no Current anti-emetic:  Zofran 8mg  Current muscle relaxants:  baclofen Current anti-anxiolytic:  no Current sleep aide:  no Current Antihypertensive medications:  no Current Antidepressant medications:  no Current Anticonvulsant medications:  topiramate 100mg  Current anti-CGRP:  [name] 140mg  Current Vitamins/Herbal/Supplements:  Magnesium 400mg , D Current Antihistamines/Decongestants:  no Other therapy:  no Hormone:  [name]  Caffeine:  2 cups coffee daily Alcohol:  occasionally Smoker:  no Diet:  Hydrates.  No soda. Exercise:  Gym 3 days a week Depression:  no; Anxiety:  No.  History of depression. Sleep hygiene:  Good except for hot flashes  HISTORY: Onset:  Early 20s Location:  right occipital Quality:  pounding, like head is going to explode Initial Intensity:  Severe prior to [name].  Now moderate and sometimes severe. Aura:  Severe headache preceded by left tunnel vision for 15-20 minutes Prodrome:   Generalized aches, tired Postdrome:  Tired, foggy, washed out Associated symptoms:  Nausea, photophobia, phonophobia, osmophobia, blurred vision.  Left facial numbness when severe.  No unilateral weakness. Initial Duration:  Prior to [name], 2 days up to 2 weeks.  Now 4 hours with sumatriptan 50mg . Initial Frequency:  Prior to [name], 20+ days per month.  Now 10 days per month (worse the week prior to next injection). Initial Frequency of abortive medication: as needed Triggers/aggravating factors:  Artifical sweetener, tea, raw onions, perfumes/lotions, skipped meals, decreased sleep Relieving factors:  rest Activity:  Aggravates.  Cannot function about 2 to 3 days a month.  Past NSAIDS:  ibuprofen Past analgesics:  Tylenol Past abortive triptans:  Sumatriptan [place]/[name]/[name] (rapid heartbeat, flushing), [name] 5mg  NS (ineffective) Past muscle relaxants:  no Past anti-emetic:  no Past antihypertensive medications:  propranolol (initially effective for many years), verapamil Past antidepressant medications:  Nortriptyline, amitriptyline, escitalopram Past anticonvulsant medications:  gabapentin Past vitamins/Herbal/Supplements:  no Past antihistamines/decongestants:  no Other past therapies:  Trigger point injections/nerve blocks  Family history of headache:  Mother, maternal grandmother  03/22/15: Sed Rate 3  PAST MEDICAL HISTORY: Past Medical History:  Diagnosis Date  . Arthritis    OA in hands, hip and feet  . Chronic sinusitis   . Complication of anesthesia   . Depression   . GERD (gastroesophageal reflux disease)   . Joint pain   . Migraine   . PONV (postoperative nausea and vomiting)     MEDICATIONS: Current Outpatient Medications on File Prior to Visit  Medication Sig Dispense Refill  . albuterol ([name];[name]) 108 (90 Base) MCG/ACT inhaler Inhale 1-2 puffs into the lungs every 6 (six)  hours as needed for wheezing or shortness of breath.    .  baclofen ([name]) 10 MG tablet Take 10 mg by mouth 3 (three) times daily as needed for muscle spasms.    . Cholecalciferol ([name]) 2000 units CAPS Take 1 capsule by mouth daily.    . diclofenac (VOLTAREN) 50 MG EC tablet Take 50 mg by mouth as needed.    . Diclofenac Sodium 3 % GEL Place onto the skin as needed.    Jody Blackwell ([name] 140 DOSE) 70 MG/ML SOAJ Inject 140 mg into the skin every 28 (twenty-eight) days. 2 pen 11  . Erenumab-aooe ([name]) 140 MG/ML SOAJ Inject 140 mg into the skin every 30 (thirty) days. 1 pen 11  . Erenumab-aooe ([name]) 70 MG/ML SOAJ Inject 70 mg into the skin every 30 (thirty) days.    [name] Kitchen esomeprazole ([name]) 40 MG capsule Take 40 mg by mouth daily at 12 noon.    . estrogens, conjugated, ([name]) 0.625 MG tablet Take 0.625 mg by mouth daily.      . magnesium oxide ([name]) 400 MG tablet Take 400 mg by mouth daily.    . ondansetron (ZOFRAN) 8 MG tablet Take 8 mg by mouth as needed for nausea or vomiting.    . ranitidine ([name]) 150 MG capsule Take 150 mg by mouth daily.     . SUMAtriptan ([name]) 50 MG tablet Take 50 mg by mouth every 2 (two) hours as needed for migraine. May repeat in 2 hours if headache persists or recurs.    . topiramate ([name]) 100 MG tablet Take 1 tablet (100 mg total) by mouth at bedtime. 90 tablet 0  . topiramate ([name]) 50 MG tablet Take 1 tablet (50 mg total) by mouth at bedtime. 30 tablet 1  . topiramate ([name]) 50 MG tablet Take 1 tablet (50 mg total) by mouth at bedtime. 90 tablet 0  . triamcinolone cream (KENALOG) 0.1 % Apply 1 application topically as needed.    [name] Kitchen [name] 70mg  injected monthly     No current facility-administered medications on file prior to visit.     ALLERGIES: Allergies  Allergen Reactions  . Aspirin Hives  . Ibuprofen Hives  . 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 Sulfa Antibiotics   . Triptans Other (See Comments)    Sensitivity to  injectable triptan drugs per pt  . Zithromax [Azithromycin Dihydrate]     FAMILY HISTORY: Family History  Problem Relation Age of Onset  . Diabetes Mother   . Diabetes Father   . Heart attack Father   . Hypertension Father   . Diabetes Maternal Grandfather   . Heart attack Maternal Grandfather   . Hypertension Maternal Grandfather   . Diabetes Paternal Grandfather   . Heart attack Paternal Grandfather   . Hypertension Paternal Grandfather   . High blood pressure Brother     SOCIAL HISTORY: Social History   Socioeconomic History  . Marital status: Married    Spouse name: Clair Gulling  . Number of children: 3  . Years of education: Not on file  . Highest education level: Master's degree (e.g., MA, MS, MEng, MEd, MSW, MBA)  Occupational History  . Occupation: Herbalist: [employer]  . Financial resource strain: Not on file  . Food insecurity:    Worry: Not on file    Inability: Not on file  . Transportation needs:    Medical:  Not on file    Non-medical: Not on file  Tobacco Use  . Smoking status: Former Smoker    Packs/day: 0.25    Years: 5.00    Pack years: 1.25    Types: Cigarettes    Last attempt to quit: 05/30/1989    Years since quitting: 28.3  . Smokeless tobacco: Never Used  . Tobacco comment: social smoker per pt  Substance and Sexual Activity  . Alcohol use: Yes    Comment: occasionally  . Drug use: No  . Sexual activity: Not on file  Lifestyle  . Physical activity:    Days per week: Not on file    Minutes per session: Not on file  . Stress: Not on file  Relationships  . Social connections:    Talks on phone: Not on file    Gets together: Not on file    Attends religious service: Not on file    Active member of club or organization: Not on file    Attends meetings of clubs or organizations: Not on file    Relationship status: Not on file  . Intimate partner violence:    Fear of current or ex partner: Not on file     Emotionally abused: Not on file    Physically abused: Not on file    Forced sexual activity: Not on file  Other Topics Concern  . Not on file  Social History Narrative   Married, lives with husband. Drinks 2 cups of coffee a day, an occasional soda. Exercises 3 X a week.    REVIEW OF SYSTEMS: Constitutional: No fevers, chills, or sweats, no generalized fatigue, change in appetite Eyes: No visual changes, double vision, eye pain Ear, nose and throat: No hearing loss, ear pain, nasal congestion, sore throat Cardiovascular: No chest pain, palpitations Respiratory:  No shortness of breath at rest or with exertion, wheezes GastrointestinaI: No nausea, vomiting, diarrhea, abdominal pain, fecal incontinence Genitourinary:  No dysuria, urinary retention or frequency Musculoskeletal:  No neck pain, back pain Integumentary: No rash, pruritus, skin lesions Neurological: as above Psychiatric: No depression, insomnia, anxiety Endocrine: No palpitations, fatigue, diaphoresis, mood swings, change in appetite, change in weight, increased thirst Hematologic/Lymphatic:  No purpura, petechiae. Allergic/Immunologic: no itchy/runny eyes, nasal congestion, recent allergic reactions, rashes  PHYSICAL EXAM: Blood pressure 102/78, pulse 79, height 5\' 5"  (1.651 m), weight 214 lb (97.1 kg), SpO2 97 %. General: No acute distress.  Patient appears well-groomed.   Head:  Normocephalic/atraumatic Eyes:  Fundi examined but not visualized Neck: supple, no paraspinal tenderness, full range of motion Heart:  Regular rate and rhythm Lungs:  Clear to auscultation bilaterally Back: No paraspinal tenderness Neurological Exam: alert and oriented to person, place, and time. Attention span and concentration intact, recent and remote memory intact, fund of knowledge intact.  Speech fluent and not dysarthric, language intact.  CN II-XII intact. Bulk and tone normal, muscle strength 5/5 throughout.  Sensation to light touch,  temperature and vibration intact.  Deep tendon reflexes 2+ throughout, toes downgoing.  Finger to nose and heel to shin testing intact.  Gait normal, Romberg negative.  IMPRESSION: Chronic migraine without aura, without status migrainosus, intractable  PLAN: 1.  We will switch to extended release topiramate as it tends to be better tolerated, and increase dose:  150mg  at bedtime for 1 week, then 200mg  at bedtime.  If headaches not improved in 4 to 6 weeks, she should contact us and we will switch to another agent.  In meantime,  she will continue [name] 140mg  monthly 2.  Take Migranal  at earliest onset of headache, 1 spray in each nostril.  May repeat once in 15 minutes if needed, no more than 4 sprays total in 24 hours.  Must not be taken within 24 hours of sumatriptan or any other triptan 3.  Limit use of pain relievers to no more than 2 days out of the week.  These medications include acetaminophen, ibuprofen, triptans and narcotics.  This will help reduce risk of rebound headaches. 4.  Be aware of common food triggers such as processed sweets, processed foods with nitrites (such as deli meat, hot dogs, sausages), foods with MSG, alcohol (such as wine), chocolate, certain cheeses, certain fruits (dried fruits, bananas, some citrus fruit), vinegar, diet soda. 4.  Avoid caffeine 5.  Routine exercise 6.  Proper sleep hygiene 7.  Stay adequately hydrated with water 8.  Keep a headache diary. 9.  Maintain proper stress management. 10.  Do not skip meals. 11.  Consider supplements:  Magnesium citrate 400mg  to 600mg  daily, riboflavin 400mg , Coenzyme Q 10 100mg  three times daily 12.  Will give Toradol 60mg  shot today 13.  Follow up in 3 to 4 months.  Metta Clines, DO  CC: Mayra Neer, MD

## 2017-10-07 ENCOUNTER — Ambulatory Visit (INDEPENDENT_AMBULATORY_CARE_PROVIDER_SITE_OTHER): Payer: No Typology Code available for payment source | Admitting: Neurology

## 2017-10-07 ENCOUNTER — Encounter: Payer: Self-pay | Admitting: Neurology

## 2017-10-07 ENCOUNTER — Telehealth: Payer: Self-pay

## 2017-10-07 VITALS — BP 102/78 | HR 79 | Ht 65.0 in | Wt 214.0 lb

## 2017-10-07 DIAGNOSIS — G43719 Chronic migraine without aura, intractable, without status migrainosus: Secondary | ICD-10-CM | POA: Diagnosis not present

## 2017-10-07 MED ORDER — TOPIRAMATE ER 100 MG PO SPRINKLE CAP24
100.0000 mg | EXTENDED_RELEASE_CAPSULE | Freq: Every day | ORAL | 0 refills | Status: DC
Start: 2017-10-07 — End: 2018-08-20

## 2017-10-07 MED ORDER — DIHYDROERGOTAMINE MESYLATE 4 MG/ML NA SOLN: 1.0000 | NASAL | 12 refills | Status: DC | PRN

## 2017-10-07 MED ORDER — KETOROLAC TROMETHAMINE 60 MG/2ML IM SOLN
60.0000 mg | Freq: Once | INTRAMUSCULAR | Status: AC
Start: 2017-10-07 — End: 2017-10-07
  Administered 2017-10-07: 60 mg via INTRAMUSCULAR

## 2017-10-07 MED ORDER — TOPIRAMATE ER 50 MG PO SPRINKLE CAP24
50.0000 mg | EXTENDED_RELEASE_CAPSULE | Freq: Every day | ORAL | 0 refills | Status: DC
Start: 2017-10-07 — End: 2018-04-14

## 2017-10-07 NOTE — Addendum Note (Signed)
Addended by: Clois Comber on: 10/07/2017 08:34 AM   Modules accepted: Orders

## 2017-10-07 NOTE — Telephone Encounter (Signed)
Called Pt, LMOVM advising her to call us after being on Qudexy (samples provided) when it is time for an Rx to be sent in. I advised her of the copay card available if she tolerates the medication

## 2017-10-07 NOTE — Patient Instructions (Signed)
Migraine Recommendations: 1.  We will switch topiramate to extended release topiramate.  Take 150mg  at bedtime for a week, then increase to 200mg  at bedtime.  If headaches still chronic after 4 to 6 weeks of being on 200mg , contact me and we can make a change. 2.  Take Migranal  at earliest onset of headache, 1 spray in each nostril.  May repeat once in 15 minutes if needed, no more than 4 sprays total in 24 hours.  Must not be taken within 24 hours of sumatriptan or any other triptan 3.  Limit use of pain relievers to no more than 2 days out of the week.  These medications include acetaminophen, ibuprofen, triptans and narcotics.  This will help reduce risk of rebound headaches. 4.  Be aware of common food triggers such as processed sweets, processed foods with nitrites (such as deli meat, hot dogs, sausages), foods with MSG, alcohol (such as wine), chocolate, certain cheeses, certain fruits (dried fruits, bananas, some citrus fruit), vinegar, diet soda. 4.  Avoid caffeine 5.  Routine exercise 6.  Proper sleep hygiene 7.  Stay adequately hydrated with water 8.  Keep a headache diary. 9.  Maintain proper stress management. 10.  Do not skip meals. 11.  Consider supplements:  Magnesium citrate 400mg  to 600mg  daily, riboflavin 400mg , Coenzyme Q 10 100mg  three times daily 12.  Follow up in 3 to 4 months.

## 2017-10-13 ENCOUNTER — Other Ambulatory Visit: Payer: Self-pay

## 2017-10-13 MED ORDER — SUMATRIPTAN SUCCINATE 50 MG PO TABS: 50.0000 mg | ORAL_TABLET | ORAL | 3 refills | Status: DC | PRN

## 2017-10-24 ENCOUNTER — Telehealth: Payer: Self-pay | Admitting: Neurology

## 2017-10-24 NOTE — Telephone Encounter (Signed)
Patient wanting to notify Dr.Jaffe of her on going migraine since last night. Paitent states she has tried everything and can not shake it. Pt wanting advise.

## 2017-10-24 NOTE — Telephone Encounter (Signed)
Called and spoke with Pt. She woke up with migraine, took sumatriptan and diclofenac, went back to bed, still had headache when she got up, used Migranal once, it did help some. Did not repeat Migranal. Advised Pt to take another sumatriptan and diclofenac, hydrate and rest for the evening. Try ice to the neck and head. Advised her no more than 2 sumatriptan in 24 hours, but that she can use the Migranal again later if needed, and can repeat every 15 minutes if need, no more than 4 in 24. Advise her also, if headache becomes severe, she may need to be evaluated at an urgent care for an injection or steroids.

## 2017-10-26 ENCOUNTER — Encounter (HOSPITAL_BASED_OUTPATIENT_CLINIC_OR_DEPARTMENT_OTHER): Payer: Self-pay | Admitting: Emergency Medicine

## 2017-10-26 ENCOUNTER — Other Ambulatory Visit: Payer: Self-pay

## 2017-10-26 ENCOUNTER — Emergency Department (HOSPITAL_BASED_OUTPATIENT_CLINIC_OR_DEPARTMENT_OTHER)
Admission: EM | Admit: 2017-10-26 | Discharge: 2017-10-26 | Disposition: A | Discharge status: Home / Self Care | Payer: 59 | Attending: Emergency Medicine | Admitting: Emergency Medicine

## 2017-10-26 DIAGNOSIS — Z79899 Other long term (current) drug therapy: Secondary | ICD-10-CM | POA: Diagnosis not present

## 2017-10-26 DIAGNOSIS — R519 Headache, unspecified: Secondary | ICD-10-CM

## 2017-10-26 DIAGNOSIS — Z87891 Personal history of nicotine dependence: Secondary | ICD-10-CM | POA: Diagnosis not present

## 2017-10-26 DIAGNOSIS — R51 Headache: Secondary | ICD-10-CM | POA: Diagnosis not present

## 2017-10-26 MED ORDER — SODIUM CHLORIDE 0.9 % IV BOLUS
500.0000 mL | Freq: Once | INTRAVENOUS | Status: AC
  Administered 2017-10-26: 500 mL via INTRAVENOUS

## 2017-10-26 MED ORDER — KETOROLAC TROMETHAMINE 15 MG/ML IJ SOLN
15.0000 mg | Freq: Once | INTRAMUSCULAR | Status: AC
  Administered 2017-10-26: 15 mg via INTRAVENOUS
  Filled 2017-10-26: qty 1

## 2017-10-26 MED ORDER — MAGNESIUM SULFATE 2 GM/50ML IV SOLN
2.0000 g | Freq: Once | INTRAVENOUS | Status: AC
  Administered 2017-10-26: 2 g via INTRAVENOUS
  Filled 2017-10-26: qty 50

## 2017-10-26 MED ORDER — DIPHENHYDRAMINE HCL 50 MG/ML IJ SOLN
25.0000 mg | Freq: Once | INTRAMUSCULAR | Status: AC
  Administered 2017-10-26: 25 mg via INTRAVENOUS
  Filled 2017-10-26: qty 1

## 2017-10-26 MED ORDER — SODIUM CHLORIDE 0.9 % IV SOLN
INTRAVENOUS | Status: DC | PRN
  Administered 2017-10-26: 250 mL via INTRAVENOUS

## 2017-10-26 MED ORDER — PROCHLORPERAZINE EDISYLATE 10 MG/2ML IJ SOLN
5.0000 mg | Freq: Once | INTRAMUSCULAR | Status: AC
  Administered 2017-10-26: 5 mg via INTRAVENOUS
  Filled 2017-10-26: qty 2

## 2017-10-26 MED ORDER — DEXAMETHASONE SODIUM PHOSPHATE 10 MG/ML IJ SOLN
10.0000 mg | Freq: Once | INTRAMUSCULAR | Status: AC
  Administered 2017-10-26: 10 mg via INTRAVENOUS
  Filled 2017-10-26: qty 1

## 2017-10-26 NOTE — ED Triage Notes (Signed)
Migraine x 4 days, tried normal medications without relief. Contacted her neurologist for advise and told to come here.

## 2017-10-26 NOTE — ED Notes (Signed)
ED Provider at bedside. 

## 2017-10-31 ENCOUNTER — Telehealth: Payer: Self-pay | Admitting: Neurology

## 2017-10-31 MED ORDER — PREDNISONE 10 MG (21) PO TBPK: ORAL_TABLET | ORAL | 0 refills | Status: DC

## 2017-10-31 NOTE — Telephone Encounter (Signed)
Called and spoke with Pt. She was seen in the ER last Sunday and rcvd an IV cocktail, headache resolved until Tuesday 10/31/17. She has taken sumatriptan and diclofenac twice a day since Tuesday. She took her last sumatriptan this morning. She is out of Migranal and has not used it for this headache. I reminded her she was not to take the abortives more than 2 x week, and cautioned her about taking the sumatriptan that often. Pt wants to know what else she can do.

## 2017-10-31 NOTE — Telephone Encounter (Signed)
Patient called stating that she has had a headache since Tuesday of this week. She wanted to speak with Lovey Newcomer about what to do. Please call her back at [phone]. Thanks.

## 2017-10-31 NOTE — Telephone Encounter (Signed)
We just increased topiramate (ER) to 200mg  at bedtime, so she will have to wait for now.  We can prescribe her a prednisone taper to try and break current intractable headache and she should contact us at the end of the taper if not broken.

## 2017-10-31 NOTE — Telephone Encounter (Signed)
Called and advised Pt of recommendations, and to take Prednisone,  6,5,4,3,2,1. Pt verbalized understanding

## 2017-11-03 NOTE — ED Provider Notes (Signed)
[facility] Provider Note   CSN: [id number] Arrival date & time: 10/26/17  1125     History   Chief Complaint Chief Complaint  Patient presents with  . Headache    HPI Jody Blackwell is a 52 y.o. female.  HPI   52 year old female with headache.  She has a past history of what she calls migraines.  This headache most recently started about 4 to 5 days ago.  Gradual onset.  Pretty persistent since then.  She has been taking her typical headache medications without much improvement.  Denies any fevers or chills.  No vomiting but has felt nauseated.  Some photophobia.  No acute numbness, tingling or focal loss of strength.  No fevers or chills.  No neck pain or stiffness.  Past Medical History:  Diagnosis Date  . Arthritis    OA in hands, hip and feet  . Chronic sinusitis   . Complication of anesthesia   . Depression   . GERD (gastroesophageal reflux disease)   . Joint pain   . Migraine   . PONV (postoperative nausea and vomiting)     Patient Active Problem List   Diagnosis Date Noted  . Peroneal tendon tear 07/20/2015  . Left ankle pain 08/03/2010  . Left shoulder pain 06/29/2010    Past Surgical History:  Procedure Laterality Date  . ABDOMINAL HYSTERECTOMY    . ANKLE RECONSTRUCTION Right 07/20/2015   Procedure: OPEN EXPLORATION OF LATERAL RIGHT PERONEUS BREVIS TENDON WITH REPAIR ;  Surgeon: Garald Balding, MD;  Location: [facility];  Service: Orthopedics;  Laterality: Right;  . CESAREAN SECTION     x 2  . CHOLECYSTECTOMY  2012   lap choli  . MASS EXCISION  06/03/2011   Procedure: EXCISION MASS;  Surgeon: Schuyler Amor, MD;  Location: [facility];  Service: Orthopedics;  Laterality: Right;  EXCISION MASS RIGHT MIDDLE FINGER   . NASAL SINUS SURGERY  2007, 2010     OB History   None      Home Medications    Prior to Admission medications   Medication Sig Start Date End Date Taking?  Authorizing Provider  albuterol ([name];[name]) 108 (90 Base) MCG/ACT inhaler     [provider]  baclofen ([name]) 10 MG tablet Take 10 mg by mouth 3 (three) times daily as needed for muscle spasms.    [provider]  Cholecalciferol ([name]) 2000 units CAPS Take 1 capsule by mouth daily.    [provider]  diclofenac (VOLTAREN) 50 MG EC tablet Take 50 mg by mouth as needed.    [provider]  Diclofenac Sodium 3 % GEL Place onto the skin as needed.    [provider]  dihydroergotamine (MIGRANAL) 4 MG/ML nasal spray Place 1 spray into the nose as needed for migraine. Use in one nostril as directed.  No more than 4 sprays in one hour 10/07/17   Jaffe, Stephan Minister, DO  Erenumab-aooe ([name] 140 DOSE) 70 MG/ML SOAJ Inject 140 mg into the skin every 28 (twenty-eight) days. Patient not taking: Reported on 10/26/2017 03/10/17   Pieter Partridge, DO  Erenumab-aooe ([name]) 140 MG/ML SOAJ Inject 140 mg into the skin every 30 (thirty) days. 09/26/17   Pieter Partridge, DO  Erenumab-aooe ([name]) 70 MG/ML SOAJ       esomeprazole ([name]) 40 MG capsule Take 40 mg by mouth daily at 12 noon.    [provider]  estrogens, conjugated, ([name]) 0.625 MG tablet     [provider]  magnesium oxide ([name]) 400 MG tablet Take 400 mg by mouth daily.    [provider]  ondansetron (ZOFRAN) 8 MG tablet Take 8 mg by mouth as needed for nausea or vomiting.    [provider]  predniSONE (STERAPRED UNI-PAK 21 TAB) 10 MG (21) TBPK tablet As directed 10/31/17   Pieter Partridge, DO  ranitidine ([name]) 150 MG capsule Take 150 mg by mouth daily.     [provider]  SUMAtriptan ([name]) 50 MG tablet Take 1 tablet (50 mg total) by mouth every 2 (two) hours as needed for migraine. May repeat in 2 hours if headache persists or recurs. 10/13/17   Pieter Partridge, DO  topiramate ([name]) 100 MG tablet Take 1 tablet (100 mg total) by  mouth at bedtime. Patient not taking: Reported on 10/26/2017 08/07/17   Pieter Partridge, DO  topiramate ([name]) 50 MG tablet Take 1 tablet (50 mg total) by mouth at bedtime. Patient not taking: Reported on 10/26/2017 06/24/17   Pieter Partridge, DO  topiramate ([name]) 50 MG tablet Take 1 tablet (50 mg total) by mouth at bedtime. Patient not taking: Reported on 10/26/2017 08/07/17   Pieter Partridge, DO  topiramate ER ([name]) 50 MG CS24 sprinkle capsule Take 1 capsule (50 mg total) by mouth daily. Patient not taking: Reported on 10/26/2017 10/07/17   Pieter Partridge, DO  topiramate ER ([name]) CS24 sprinkle capsule Take 1 capsule (100 mg total) by mouth daily. Patient taking differently: Take 200 mg by mouth daily.  10/07/17   Pieter Partridge, DO  triamcinolone cream (KENALOG) 0.1 % Apply 1 application topically as needed.    [provider]    Family History Family History  Problem Relation Age of Onset  . Diabetes Mother   . Diabetes Father   . Heart attack Father   . Hypertension Father   . Diabetes Maternal Grandfather   . Heart attack Maternal Grandfather   . Hypertension Maternal Grandfather   . Diabetes Paternal Grandfather   . Heart attack Paternal Grandfather   . Hypertension Paternal Grandfather   . High blood pressure Brother     Social History Social History   Tobacco Use  . Smoking status: Former Smoker    Packs/day: 0.25    Years: 5.00    Pack years: 1.25    Types: Cigarettes    Last attempt to quit: 05/30/1989    Years since quitting: 28.4  . Smokeless tobacco: Never Used  . Tobacco comment: social smoker per pt  Substance Use Topics  . Alcohol use: Yes    Comment: occasionally  . Drug use: No     Allergies   Aspirin; Ibuprofen; Metoclopramide hcl; Sulfa antibiotics; Triptans; and Zithromax [azithromycin dihydrate]   Review of Systems Review of Systems  All systems reviewed and negative, other than as noted in HPI.  Physical Exam Updated Vital  Signs BP 110/61   Pulse (!) 52   Temp 97.7 F (36.5 C) (Oral)   Resp 16   Ht 5\' 5"  (1.651 m)   Wt 95.3 kg   SpO2 96%   BMI 34.95 kg/m   Physical Exam  Constitutional: She is oriented to person, place, and time. She appears well-developed and well-nourished. No distress.  HENT:  Head: Normocephalic and atraumatic.  Eyes: Pupils are equal, round, and reactive to light. Conjunctivae and EOM are normal.  Right eye exhibits no discharge. Left eye exhibits no discharge.  Neck: Neck supple.  No nuchal rigidity  Cardiovascular: Normal rate, regular rhythm and normal heart sounds. Exam reveals no gallop and no friction rub.  No murmur heard. Pulmonary/Chest: Effort normal and breath sounds normal. No respiratory distress.  Abdominal: Soft. She exhibits no distension. There is no tenderness.  Musculoskeletal: She exhibits no edema or tenderness.  Neurological: She is alert and oriented to person, place, and time. No cranial nerve deficit. She exhibits normal muscle tone. Coordination normal.  Good finger to nose b/l. Steady gait.   Skin: Skin is warm and dry.  Psychiatric: She has a normal mood and affect. Her behavior is normal. Thought content normal.  Nursing note and vitals reviewed.    ED Treatments / Results  Labs (all labs ordered are listed, but only abnormal results are displayed) Labs Reviewed - No data to display  EKG None  Radiology No results found.  Procedures Procedures (including critical care time)  Medications Ordered in ED Medications  sodium chloride 0.9 % bolus 500 mL (0 mLs Intravenous Stopped 10/26/17 1307)  ketorolac (TORADOL) 15 MG/ML injection 15 mg (15 mg Intravenous Given 10/26/17 1212)  diphenhydrAMINE (BENADRYL) injection 25 mg (25 mg Intravenous Given 10/26/17 1209)  prochlorperazine (COMPAZINE) injection 5 mg (5 mg Intravenous Given 10/26/17 1216)  dexamethasone (DECADRON) injection 10 mg (10 mg Intravenous Given 10/26/17 1214)  magnesium sulfate IVPB 2  g 50 mL (0 g Intravenous Stopped 10/26/17 1301)     Initial Impression / Assessment and Plan / ED Course  I have reviewed the triage vital signs and the nursing notes.  Pertinent labs & imaging results that were available during my care of the patient were reviewed by me and considered in my medical decision making (see chart for details).     52yF with headache. Suspect primary HA. Consider emergent secondary causes such as bleed, infectious or mass but doubt. There is no history of trauma. Pt has a nonfocal neurological exam. Afebrile and neck supple. No use of blood thinning medication. Consider ocular etiology such as acute angle closure glaucoma but doubt. Pt denies acute change in visual acuity and eye exam unremarkable. Doubt temporal arteritis given age, no temporal tenderness and temporal artery pulsations palpable. Doubt CO poisoning. No contacts with similar symptoms. Doubt venous thrombosis. Doubt carotid or vertebral arteries dissection. Symptoms improved with meds. Feel that can be safely discharged, but strict return precautions discussed. Outpt fu.   Final Clinical Impressions(s) / ED Diagnoses   Final diagnoses:  Bad headache    ED Discharge Orders    None       Virgel Manifold, MD 11/03/17 [phone]

## 2017-11-06 ENCOUNTER — Telehealth: Payer: Self-pay | Admitting: Neurology

## 2017-11-06 NOTE — Telephone Encounter (Signed)
Patient needs to let Lovey Newcomer know that she still has a migraine and has finished the prednisone

## 2017-11-07 NOTE — Telephone Encounter (Signed)
If she is actually having an intractable headache that is daily, then she needs IV therapy.  When she went to the ED, did the give her IM or IV headache cocktail.  If IM, then I recommend IV (which needs to be done in the ER or short-stay/outpatient infusion center if possible).  If she did receive IV cocktail, then I recommend IV sodium valproate 500mg .

## 2017-11-07 NOTE — Telephone Encounter (Signed)
Called and spoke with Pt, she is feeling better today, she will wait until Monday to see how she feels. I will call her back on Monday.

## 2017-11-10 NOTE — Telephone Encounter (Signed)
Called and LMOVM for Pt to call me back tomorrow

## 2017-11-11 NOTE — Telephone Encounter (Signed)
Rcvd VM from Pt, she is doing well, headache free since Friday. She is to send a my chart message for refill requests

## 2017-11-14 ENCOUNTER — Other Ambulatory Visit: Payer: Self-pay

## 2017-11-14 MED ORDER — TOPIRAMATE ER 200 MG PO SPRINKLE CAP24: 200.0000 mg | EXTENDED_RELEASE_CAPSULE | Freq: Every day | ORAL | 6 refills | Status: DC

## 2017-11-14 MED ORDER — DIHYDROERGOTAMINE MESYLATE 4 MG/ML NA SOLN: 1.0000 | NASAL | 6 refills | Status: DC | PRN

## 2017-11-26 NOTE — Progress Notes (Signed)
PA initiated on cover my meds Approved, will, will receive specifics in fax

## 2017-12-03 NOTE — Progress Notes (Signed)
Fax received. [name] 200 mg approved 11/26/17-11/26/18.

## 2018-01-12 ENCOUNTER — Encounter: Payer: Self-pay | Admitting: Neurology

## 2018-01-12 ENCOUNTER — Other Ambulatory Visit: Payer: Self-pay

## 2018-01-12 ENCOUNTER — Ambulatory Visit: Payer: 59 | Admitting: Neurology

## 2018-01-12 VITALS — BP 100/64 | HR 87 | Ht 65.5 in | Wt 211.0 lb

## 2018-01-12 DIAGNOSIS — G43719 Chronic migraine without aura, intractable, without status migrainosus: Secondary | ICD-10-CM

## 2018-01-12 MED ORDER — GALCANEZUMAB-GNLM 120 MG/ML ~~LOC~~ SOSY
120.0000 mg | PREFILLED_SYRINGE | SUBCUTANEOUS | 11 refills | Status: DC
Start: 2018-01-12 — End: 2018-08-20

## 2018-01-12 MED ORDER — GALCANEZUMAB-GNLM 120 MG/ML ~~LOC~~ SOSY: 120.0000 mg | PREFILLED_SYRINGE | SUBCUTANEOUS | 11 refills | Status: DC

## 2018-01-12 MED ORDER — SUMATRIPTAN SUCCINATE 3 MG/0.5ML ~~LOC~~ SOAJ: 3.0000 mg | SUBCUTANEOUS | 0 refills | Status: DC

## 2018-01-12 NOTE — Progress Notes (Signed)
NEUROLOGY FOLLOW UP OFFICE NOTE  Jody Blackwell [id number]  HISTORY OF PRESENT ILLNESS: Jody Blackwell is a 52 year old female with depression, GERD, chronic pain syndrome who follows up for migraine.  UPDATE: Intensity:  severe Duration:  All day.  The weekend prior to injection, last 5 days Frequency:  The week before and week of the [name] injection, daily and then 1 or 2 migraines the two weeks following the injection Rescue:  Usually first take sumatriptan and diclofenac, then repeat [name] and then diclofenac and baclofen at bedtime.  She takes the [place] for the intractable migraine prior to injection. Frequency of abortive medication: 15-16 days a month. Current NSAIDS: Diclofenac 50 mg Current analgesics: None Current triptans: Sumatriptan 50 mg (ineffective) Current ergotamine: Migranal (somewhat effective) Current anti-emetic: Zofran 8 mg Current muscle relaxants: Baclofen Current anti-anxiolytic: None Current sleep aide: None Current Antihypertensive medications: None Current Antidepressant medications: Lexapro Current Anticonvulsant medications: [name] 200 mg at bedtime Current anti-CGRP: [name] 140 mg Current Vitamins/Herbal/Supplements: Magnesium 400 mg, D Current Antihistamines/Decongestants: None Other therapy: None Hormone: none  Caffeine: 2 cups coffee daily Diet: Hydrates.  No soda. Exercise: Gym 3 days a week Depression: No; Anxiety: No.  History of depression. Other pain: No Sleep hygiene: Good except for hot flashes.  HISTORY:  Onset: Early 71s Location: right occipital Quality: pounding, like head is going to explode Initial Intensity: Severe prior to [place]. Now moderate and sometimes severe. Aura:  Severe headache preceded by left tunnel vision for 15-20 minutes Prodrome: Generalized aches, tired Postdrome: Tired, foggy, washed out Associated symptoms: Nausea, photophobia, phonophobia, osmophobia, blurred vision.  Left  facial numbness when severe.  No unilateral weakness. Initial Duration: Prior to [name], 2 days up to 2 weeks. Now 4 hours with sumatriptan 50mg . Initial Frequency: Prior to [name], 20+ days per month. Now 10 days per month (worse the week prior to next injection). Initial Frequency of abortive medication: as needed Triggers: Artificial sweeteners, tea, raw onions, perfumes/lotions, skipped meals, decreased sleep Relieving factors:  rest Activity: Aggravates. Cannot function about 2 to 3 days a month.  Past NSAIDS: ibuprofen Past analgesics: Tylenol Past abortive triptans: Sumatriptan patch (effective), [name]/[name] (rapid heartbeat, flushing), [name] 5mg  NS (ineffective) Past muscle relaxants: no Past anti-emetic: no Past antihypertensive medications: propranolol (initially effective for many years), verapamil Past antidepressant medications: Nortriptyline, amitriptyline, escitalopram Past anticonvulsant medications: gabapentin Past vitamins/Herbal/Supplements: no Past antihistamines/decongestants: no Other past therapies: Trigger point injections/nerve blocks  Family history of headache: Mother, maternal grandmother  03/22/15: Sed Rate 3  PAST MEDICAL HISTORY: Past Medical History:  Diagnosis Date  . Arthritis    OA in hands, hip and feet  . Chronic sinusitis   . Complication of anesthesia   . Depression   . GERD (gastroesophageal reflux disease)   . Joint pain   . Migraine   . PONV (postoperative nausea and vomiting)     MEDICATIONS: Current Outpatient Medications on File Prior to Visit  Medication Sig Dispense Refill  . albuterol ([name];[name]) 108 (90 Base) MCG/ACT inhaler     . baclofen ([name]) 10 MG tablet Take 10 mg by mouth 3 (three) times daily as needed for muscle spasms.    . Cholecalciferol ([name]) 2000 units CAPS Take 1 capsule by mouth daily.    . diclofenac (VOLTAREN) 50 MG EC tablet Take 50 mg by mouth as needed.     . Diclofenac Sodium 3 % GEL Place onto the skin as needed.    . dihydroergotamine (  MIGRANAL) 4 MG/ML nasal spray Place 1 spray into the nose as needed for migraine. Use in one nostril as directed.  No more than 4 sprays in one hour 8 mL 12  . dihydroergotamine (MIGRANAL) 4 MG/ML nasal spray Place 1 spray into the nose as needed for migraine. Use in one nostril as directed.  No more than 4 sprays in one hour 8 mL 6  . Erenumab-aooe ([name] 140 DOSE) 70 MG/ML SOAJ Inject 140 mg into the skin every 28 (twenty-eight) days. (Patient not taking: Reported on 10/26/2017) 2 pen 11  . Erenumab-aooe ([name]) 140 MG/ML SOAJ Inject 140 mg into the skin every 30 (thirty) days. 1 pen 11  . Erenumab-aooe ([name]) 70 MG/ML SOAJ     . esomeprazole ([name]) 40 MG capsule Take 40 mg by mouth daily at 12 noon.    . estrogens, conjugated, ([name]) 0.625 MG tablet     . magnesium oxide ([name]) 400 MG tablet Take 400 mg by mouth daily.    . ondansetron (ZOFRAN) 8 MG tablet Take 8 mg by mouth as needed for nausea or vomiting.    . predniSONE (STERAPRED UNI-PAK 21 TAB) 10 MG (21) TBPK tablet As directed 21 tablet 0  . ranitidine ([name]) 150 MG capsule Take 150 mg by mouth daily.     . SUMAtriptan ([name]) 50 MG tablet Take 1 tablet (50 mg total) by mouth every 2 (two) hours as needed for migraine. May repeat in 2 hours if headache persists or recurs. 10 tablet 3  . topiramate ([name]) 100 MG tablet Take 1 tablet (100 mg total) by mouth at bedtime. (Patient not taking: Reported on 10/26/2017) 90 tablet 0  . topiramate ([name]) 50 MG tablet Take 1 tablet (50 mg total) by mouth at bedtime. (Patient not taking: Reported on 10/26/2017) 30 tablet 1  . topiramate ([name]) 50 MG tablet Take 1 tablet (50 mg total) by mouth at bedtime. (Patient not taking: Reported on 10/26/2017) 90 tablet 0  . topiramate ER ([name]) 200 MG CS24 sprinkle capsule Take 1 capsule (200 mg total) by mouth at bedtime. 30 capsule 6  . topiramate ER  ([name]) 50 MG CS24 sprinkle capsule Take 1 capsule (50 mg total) by mouth daily. (Patient not taking: Reported on 10/26/2017) 60 each 0  . topiramate ER ([name]) CS24 sprinkle capsule Take 1 capsule (100 mg total) by mouth daily. (Patient taking differently: Take 200 mg by mouth daily. ) 60 each 0  . triamcinolone cream (KENALOG) 0.1 % Apply 1 application topically as needed.     No current facility-administered medications on file prior to visit.     ALLERGIES: Allergies  Allergen Reactions  . Aspirin Hives  . Ibuprofen Hives  . 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 Sulfa Antibiotics   . Triptans Other (See Comments)    Sensitivity to injectable triptan drugs per pt  . Zithromax [Azithromycin Dihydrate]     FAMILY HISTORY: Family History  Problem Relation Age of Onset  . Diabetes Mother   . Diabetes Father   . Heart attack Father   . Hypertension Father   . Diabetes Maternal Grandfather   . Heart attack Maternal Grandfather   . Hypertension Maternal Grandfather   . Diabetes Paternal Grandfather   . Heart attack Paternal Grandfather   . Hypertension Paternal Grandfather   . High blood pressure Brother    SOCIAL HISTORY: Social History   Socioeconomic History  . Marital  status: Married    Spouse name: Clair Gulling  . Number of children: 3  . Years of education: Not on file  . Highest education level: Master's degree (e.g., MA, MS, MEng, MEd, MSW, MBA)  Occupational History  . Occupation: Herbalist: [employer]  . Financial resource strain: Not on file  . Food insecurity:    Worry: Not on file    Inability: Not on file  . Transportation needs:    Medical: Not on file    Non-medical: Not on file  Tobacco Use  . Smoking status: Former Smoker    Packs/day: 0.25    Years: 5.00    Pack years: 1.25    Types: Cigarettes    Last attempt to quit: 05/30/1989    Years since quitting: 28.6  . Smokeless tobacco:  Never Used  . Tobacco comment: social smoker per pt  Substance and Sexual Activity  . Alcohol use: Yes    Comment: occasionally  . Drug use: No  . Sexual activity: Not on file  Lifestyle  . Physical activity:    Days per week: Not on file    Minutes per session: Not on file  . Stress: Not on file  Relationships  . Social connections:    Talks on phone: Not on file    Gets together: Not on file    Attends religious service: Not on file    Active member of club or organization: Not on file    Attends meetings of clubs or organizations: Not on file    Relationship status: Not on file  . Intimate partner violence:    Fear of current or ex partner: Not on file    Emotionally abused: Not on file    Physically abused: Not on file    Forced sexual activity: Not on file  Other Topics Concern  . Not on file  Social History Narrative   Married, lives with husband. Drinks 2 cups of coffee a day, an occasional soda. Exercises 3 X a week.    REVIEW OF SYSTEMS: Constitutional: No fevers, chills, or sweats, no generalized fatigue, change in appetite Eyes: No visual changes, double vision, eye pain Ear, nose and throat: No hearing loss, ear pain, nasal congestion, sore throat Cardiovascular: No chest pain, palpitations Respiratory:  No shortness of breath at rest or with exertion, wheezes GastrointestinaI: No nausea, vomiting, diarrhea, abdominal pain, fecal incontinence Genitourinary:  No dysuria, urinary retention or frequency Musculoskeletal:  No neck pain, back pain Integumentary: No rash, pruritus, skin lesions Neurological: as above Psychiatric: No depression, insomnia, anxiety Endocrine: No palpitations, fatigue, diaphoresis, mood swings, change in appetite, change in weight, increased thirst Hematologic/Lymphatic:  No purpura, petechiae. Allergic/Immunologic: no itchy/runny eyes, nasal congestion, recent allergic reactions, rashes  PHYSICAL EXAM: Blood pressure 100/64, pulse  87, height 5' 5.5" (1.664 m), weight 211 lb (95.7 kg), SpO2 97 %. General: No acute distress.  Patient appears well-groomed.  Head:  Normocephalic/atraumatic Eyes:  Fundi examined but not visualized Neck: supple, no paraspinal tenderness, full range of motion Heart:  Regular rate and rhythm Lungs:  Clear to auscultation bilaterally Back: No paraspinal tenderness Neurological Exam: alert and oriented to person, place, and time. Attention span and concentration intact, recent and remote memory intact, fund of knowledge intact.  Speech fluent and not dysarthric, language intact.  CN II-XII intact. Bulk and tone normal, muscle strength 5/5 throughout.  Sensation to light touch, temperature and vibration intact.  Deep tendon  reflexes 2+ throughout, toes downgoing.  Finger to nose and heel to shin testing intact.  Gait normal, Romberg negative.  IMPRESSION: Chronic migraine without aura, without status migrainosus, intractable  PLAN: 1.  For preventative management, she will discontinue [name].  Instead, she will try Emgality.  If this is ineffective, then I would recommend Botox. 2.  For abortive therapy, she will try Zembrace.  She will stop sumatriptan tablet and Migranal 3.  Limit use of pain relievers to no more than 2 days out of week to prevent risk of rebound or medication-overuse headache. 4.  Keep headache diary 5.  Exercise, hydration, caffeine cessation, sleep hygiene, monitor for and avoid triggers 6.  Consider:  magnesium citrate 400mg  daily, riboflavin 400mg  daily, and coenzyme Q10 100mg  three times daily 7.  Follow up in 3 to 4 months.  Metta Clines, DO  CC: Mayra Neer, MD

## 2018-01-12 NOTE — Patient Instructions (Signed)
1.  STOP [name].  We will try Emgality. 2.  Stop sumatriptan tablet and Migranal.  At earliest onset of migraine, take Zembrace shot.  May repeat dose in 1 hour if needed, no more than 2 shots in 24h 3.  Limit use of pain relievers to no more than 2 days out of week to prevent risk of rebound or medication-overuse headache. 4.  Follow up in 3 to 4 months.

## 2018-02-03 ENCOUNTER — Other Ambulatory Visit: Payer: Self-pay

## 2018-02-03 DIAGNOSIS — G43109 Migraine with aura, not intractable, without status migrainosus: Secondary | ICD-10-CM

## 2018-02-03 MED ORDER — SUMATRIPTAN SUCCINATE 3 MG/0.5ML ~~LOC~~ SOAJ: 3.0000 mg | SUBCUTANEOUS | 3 refills | Status: DC

## 2018-02-05 ENCOUNTER — Other Ambulatory Visit: Payer: Self-pay

## 2018-02-05 DIAGNOSIS — G43109 Migraine with aura, not intractable, without status migrainosus: Secondary | ICD-10-CM

## 2018-02-05 MED ORDER — TOPIRAMATE ER 200 MG PO SPRINKLE CAP24
200.0000 mg | EXTENDED_RELEASE_CAPSULE | Freq: Every day | ORAL | 4 refills | Status: DC
Start: 2018-02-05 — End: 2018-04-14

## 2018-02-06 NOTE — Progress Notes (Signed)
Received notice from [employer] that pt's [facility] has been   Approved 02/05/2018 thru 02/06/2019

## 2018-02-11 NOTE — Progress Notes (Signed)
Initiated on cover my meds 

## 2018-02-16 ENCOUNTER — Other Ambulatory Visit: Payer: Self-pay | Admitting: *Deleted

## 2018-02-16 MED ORDER — DICLOFENAC SODIUM 50 MG PO TBEC: 50.0000 mg | DELAYED_RELEASE_TABLET | ORAL | 5 refills | Status: DC | PRN

## 2018-02-16 MED ORDER — DICLOFENAC SODIUM 3 % TD GEL: 1.0000 | TRANSDERMAL | 2 refills | Status: DC | PRN

## 2018-02-16 NOTE — Progress Notes (Signed)
Received notice from [employer] that pt's [facility] INJECTION has been  Approved 02/11/18 thru 05/13/2018

## 2018-03-02 ENCOUNTER — Encounter: Payer: Self-pay | Admitting: Family Medicine

## 2018-03-02 ENCOUNTER — Telehealth: Payer: 59 | Admitting: Family Medicine

## 2018-03-02 DIAGNOSIS — R05 Cough: Secondary | ICD-10-CM

## 2018-03-02 DIAGNOSIS — R059 Cough, unspecified: Secondary | ICD-10-CM

## 2018-03-02 MED ORDER — BENZONATATE 100 MG PO CAPS: 100.0000 mg | ORAL_CAPSULE | Freq: Three times a day (TID) | ORAL | 0 refills | Status: DC | PRN

## 2018-03-02 NOTE — Progress Notes (Signed)
We are sorry that you are not feeling well.  Here is how we plan to help!  Based on your presentation I believe you most likely have A cough due to a virus.  This is called viral bronchitis and is best treated by rest, plenty of fluids and control of the cough.  You may use Ibuprofen or Tylenol as directed to help your symptoms.     In addition you may use A prescription cough medication called Tessalon Perles 100mg . You may take 1-2 capsules every 8 hours as needed for your cough.  You can use over the counter cough preparations and your inhaler as needed. Be sure to increase hydration and monitor or wheezing as this may require a face to face evaluation.  From your responses in the eVisit questionnaire you describe inflammation in the upper respiratory tract which is causing a significant cough.  This is commonly called Bronchitis and has four common causes:    Allergies  Viral Infections  Acid Reflux  Bacterial Infection Allergies, viruses and acid reflux are treated by controlling symptoms or eliminating the cause. An example might be a cough caused by taking certain blood pressure medications. You stop the cough by changing the medication. Another example might be a cough caused by acid reflux. Controlling the reflux helps control the cough.  USE OF BRONCHODILATOR ("RESCUE") INHALERS: There is a risk from using your bronchodilator too frequently.  The risk is that over-reliance on a medication which only relaxes the muscles surrounding the breathing tubes can reduce the effectiveness of medications prescribed to reduce swelling and congestion of the tubes themselves.  Although you feel brief relief from the bronchodilator inhaler, your asthma may actually be worsening with the tubes becoming more swollen and filled with mucus.  This can delay other crucial treatments, such as oral steroid medications. If you need to use a bronchodilator inhaler daily, several times per day, you should  discuss this with your provider.  There are probably better treatments that could be used to keep your asthma under control.     HOME CARE . Only take medications as instructed by your medical team. . Complete the entire course of an antibiotic. . Drink plenty of fluids and get plenty of rest. . Avoid close contacts especially the very young and the elderly . Cover your mouth if you cough or cough into your sleeve. . Always remember to wash your hands . A steam or ultrasonic humidifier can help congestion.   GET HELP RIGHT AWAY IF: . You develop worsening fever. . You become short of breath . You cough up blood. . Your symptoms persist after you have completed your treatment plan MAKE SURE YOU   Understand these instructions.  Will watch your condition.  Will get help right away if you are not doing well or get worse.  Your e-visit answers were reviewed by a board certified advanced clinical practitioner to complete your personal care plan.  Depending on the condition, your plan could have included both over the counter or prescription medications. If there is a problem please reply  once you have received a response from your provider. Your safety is important to [place].  If you have drug allergies check your prescription carefully.    You can use MyChart to ask questions about today's visit, request a non-urgent call back, or ask for a work or school excuse for 24 hours related to this e-Visit. If it has been greater than 24 hours you will need  to follow up with your provider, or enter a new e-Visit to address those concerns. You will get an e-mail in the next two days asking about your experience.  I hope that your e-visit has been valuable and will speed your recovery. Thank you for using e-visits.   

## 2018-04-14 ENCOUNTER — Encounter: Payer: Self-pay | Admitting: Neurology

## 2018-04-14 ENCOUNTER — Ambulatory Visit (INDEPENDENT_AMBULATORY_CARE_PROVIDER_SITE_OTHER): Payer: 59 | Admitting: Neurology

## 2018-04-14 VITALS — BP 102/72 | HR 85 | Ht 65.5 in | Wt 209.0 lb

## 2018-04-14 DIAGNOSIS — Z79899 Other long term (current) drug therapy: Secondary | ICD-10-CM

## 2018-04-14 DIAGNOSIS — G43019 Migraine without aura, intractable, without status migrainosus: Secondary | ICD-10-CM | POA: Diagnosis not present

## 2018-04-14 MED ORDER — BACLOFEN 10 MG PO TABS: 10.0000 mg | ORAL_TABLET | Freq: Three times a day (TID) | ORAL | 3 refills | Status: DC | PRN

## 2018-04-14 MED ORDER — KETOROLAC TROMETHAMINE 60 MG/2ML IM SOLN
60.0000 mg | Freq: Once | INTRAMUSCULAR | Status: AC
  Administered 2018-04-14: 60 mg via INTRAMUSCULAR

## 2018-04-14 MED ORDER — NAPROXEN 500 MG PO TABS: ORAL_TABLET | ORAL | 3 refills | Status: DC

## 2018-04-14 NOTE — Progress Notes (Addendum)
NEUROLOGY FOLLOW UP OFFICE NOTE  Desira Alessandrini [place] [id number]  HISTORY OF PRESENT ILLNESS: Danessa Mensch is a 53 year old woman with chronic pain syndrome, depression and GERD who follows up for migraine.  UPDATE: Pattern is same.  First 3 weeks following anti-CGRP injection, she is headache-free.  Last week prior to next shot, she has 5 to 7 days of migraine.  Migraine severe.  Currently on day five of a migraine.    Rescue:  Usually first take sumatriptan and diclofenac, then repeat [name] and then diclofenac and baclofen at bedtime.   Frequency of abortive medication: 15-16 days a month. Current NSAIDS: Diclofenac 50 mg Current analgesics: None Current triptans: Zembrace SymTouch Current ergotamine: none Current anti-emetic: Zofran 8 mg Current muscle relaxants: Baclofen Current anti-anxiolytic: None Current sleep aide: None Current Antihypertensive medications: None Current Antidepressant medications: Lexapro Current Anticonvulsant medications: [name] 200 mg at bedtime Current anti-CGRP: Emgality Current Vitamins/Herbal/Supplements: Magnesium 400 mg, D Current Antihistamines/Decongestants: None Other therapy: None Hormone: none  Caffeine: 2 cups coffee daily Diet: Hydrates.  No soda. Exercise: Gym 3 days a week Depression: No; Anxiety: No.  History of depression. Other pain: No Sleep hygiene: Good except for hot flashes.  HISTORY:  Onset: Early 45s Location: right occipital Quality: pounding, like head is going to explode Initial Intensity: Severe prior to [place]. Now moderate and sometimes severe. Aura: Severe headache preceded by left tunnel vision for 15-20 minutes Prodrome: Generalized aches, tired Postdrome: Tired, foggy, washed out Associated symptoms: Nausea, photophobia, phonophobia, osmophobia, blurred vision. Left facial numbness when severe. No unilateral weakness. Initial Duration: Prior to [name], 2 days up to 2 weeks. Now 4  hours with sumatriptan 50mg . Initial Frequency: Prior to [name], 20+ days per month. Now 10 days per month (worse the week prior to next injection). Initial Frequency of abortive medication: as needed Triggers: Artificial sweeteners, tea, raw onions, perfumes/lotions, skipped meals, decreased sleep Relieving factors: Rest Activity: Aggravates. Cannot function about 2 to 3 days a month.  Past NSAIDS: ibuprofen, naproxen Past analgesics: Tylenol Past abortive triptans: Sumatriptan 50 mg (ineffective), Sumatriptan patch (effective), [name]/[name] (rapid heartbeat, flushing), [name] 5mg  NS (ineffective) Past ergotamine:  Migranal (nose bleeds) Past muscle relaxants: no Past anti-emetic: no Past antihypertensive medications: propranolol (initially effective for many years), verapamil Past antidepressant medications: Nortriptyline, amitriptyline, escitalopram Past anticonvulsant medications: gabapentin Past anti--CGRP: [name] Past vitamins/Herbal/Supplements: no Past antihistamines/decongestants: no Other past therapies: Trigger point injections/nerve blocks  Family history of headache: Mother, maternal grandmother  03/22/15: Sed Rate 3  PAST MEDICAL HISTORY: Past Medical History:  Diagnosis Date  . Arthritis    OA in hands, hip and feet  . Chronic sinusitis   . Complication of anesthesia   . Depression   . GERD (gastroesophageal reflux disease)   . Joint pain   . Migraine   . PONV (postoperative nausea and vomiting)     MEDICATIONS: Current Outpatient Medications on File Prior to Visit  Medication Sig Dispense Refill  . albuterol ([name];[name]) 108 (90 Base) MCG/ACT inhaler     . baclofen ([name]) 10 MG tablet Take 10 mg by mouth 3 (three) times daily as needed for muscle spasms.    . benzonatate (TESSALON) 100 MG capsule Take 1-2 capsules (100-200 mg total) by mouth 3 (three) times daily as needed for cough. 30 capsule 0  . Cholecalciferol  ([name]) 2000 units CAPS Take 1 capsule by mouth daily.    . diclofenac (VOLTAREN) 50 MG EC tablet Take 1 tablet (50  mg total) by mouth as needed. 30 tablet 5  . Diclofenac Sodium 3 % GEL Place 1 Dose onto the skin as needed. 1 Tube 2  . escitalopram (LEXAPRO) 10 MG tablet     . esomeprazole ([name]) 40 MG capsule Take 40 mg by mouth daily at 12 noon.    . estrogens, conjugated, ([name]) 0.625 MG tablet     . Galcanezumab-gnlm (EMGALITY) 120 MG/ML SOSY Inject 120 mg into the skin every 30 (thirty) days. 1 Syringe 11  . Galcanezumab-gnlm (EMGALITY) 120 MG/ML SOSY Inject 120 mg into the skin every 30 (thirty) days. 1 Syringe 11  . magnesium oxide ([name]) 400 MG tablet Take 400 mg by mouth daily.    . ondansetron (ZOFRAN) 8 MG tablet Take 8 mg by mouth as needed for nausea or vomiting.    . predniSONE (STERAPRED UNI-PAK 21 TAB) 10 MG (21) TBPK tablet As directed 21 tablet 0  . [name] vaginal cream APP VAGINALLY THREE TIMES A WEEK HS UTD  12  . ranitidine ([name]) 150 MG capsule Take 150 mg by mouth daily.     . SUMAtriptan Succinate (ZEMBRACE SYMTOUCH) 3 MG/0.5ML SOAJ Inject 3 mg into the skin as directed. May repeat in 1 hr if needed, no more than 2 in 24 hrs 4 pen 0  . SUMAtriptan Succinate (ZEMBRACE SYMTOUCH) 3 MG/0.5ML SOAJ Inject 3 mg into the skin as directed. Inject 3 mg into the skin as directed. May repeat in 1 hr if needed, no more than 2 in 24 hrs 9 pen 3  . topiramate ([name]) 100 MG tablet Take 1 tablet (100 mg total) by mouth at bedtime. (Patient not taking: Reported on 10/26/2017) 90 tablet 0  . topiramate ([name]) 50 MG tablet Take 1 tablet (50 mg total) by mouth at bedtime. (Patient not taking: Reported on 10/26/2017) 30 tablet 1  . topiramate ([name]) 50 MG tablet Take 1 tablet (50 mg total) by mouth at bedtime. (Patient not taking: Reported on 10/26/2017) 90 tablet 0  . topiramate ER ([name]) 200 MG CS24 sprinkle capsule Take 1 capsule (200 mg total) by mouth at bedtime. 30  capsule 6  . topiramate ER ([name]) 200 MG CS24 sprinkle capsule Take 1 capsule (200 mg total) by mouth daily. 90 each 4  . topiramate ER ([name]) 50 MG CS24 sprinkle capsule Take 1 capsule (50 mg total) by mouth daily. (Patient not taking: Reported on 10/26/2017) 60 each 0  . topiramate ER ([name]) CS24 sprinkle capsule Take 1 capsule (100 mg total) by mouth daily. (Patient taking differently: Take 200 mg by mouth daily. ) 60 each 0  . triamcinolone cream (KENALOG) 0.1 % Apply 1 application topically as needed.     No current facility-administered medications on file prior to visit.     ALLERGIES: Allergies  Allergen Reactions  . Aspirin Hives  . Ibuprofen Hives  . 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 Sulfa Antibiotics   . Triptans Other (See Comments)    Sensitivity to injectable triptan drugs per pt  . Zithromax [Azithromycin Dihydrate]     FAMILY HISTORY: Family History  Problem Relation Age of Onset  . Diabetes Mother   . Diabetes Father   . Heart attack Father   . Hypertension Father   . Diabetes Maternal Grandfather   . Heart attack Maternal Grandfather   . Hypertension Maternal Grandfather   . Diabetes Paternal Grandfather   . Heart attack Paternal  Grandfather   . Hypertension Paternal Grandfather   . High blood pressure Brother    SOCIAL HISTORY: Social History   Socioeconomic History  . Marital status: Married    Spouse name: Clair Gulling  . Number of children: 3  . Years of education: Not on file  . Highest education level: Master's degree (e.g., MA, MS, MEng, MEd, MSW, MBA)  Occupational History  . Occupation: Herbalist: [employer]  . Financial resource strain: Not on file  . Food insecurity:    Worry: Not on file    Inability: Not on file  . Transportation needs:    Medical: Not on file    Non-medical: Not on file  Tobacco Use  . Smoking status: Former Smoker    Packs/day: 0.25     Years: 5.00    Pack years: 1.25    Types: Cigarettes    Last attempt to quit: 05/30/1989    Years since quitting: 28.8  . Smokeless tobacco: Never Used  . Tobacco comment: social smoker per pt  Substance and Sexual Activity  . Alcohol use: Yes    Comment: occasionally  . Drug use: No  . Sexual activity: Not on file  Lifestyle  . Physical activity:    Days per week: Not on file    Minutes per session: Not on file  . Stress: Not on file  Relationships  . Social connections:    Talks on phone: Not on file    Gets together: Not on file    Attends religious service: Not on file    Active member of club or organization: Not on file    Attends meetings of clubs or organizations: Not on file    Relationship status: Not on file  . Intimate partner violence:    Fear of current or ex partner: Not on file    Emotionally abused: Not on file    Physically abused: Not on file    Forced sexual activity: Not on file  Other Topics Concern  . Not on file  Social History Narrative   Married, lives with husband. Drinks 2 cups of coffee a day, an occasional soda. Exercises 3 X a week.    REVIEW OF SYSTEMS: Constitutional: No fevers, chills, or sweats, no generalized fatigue, change in appetite Eyes: No visual changes, double vision, eye pain Ear, nose and throat: No hearing loss, ear pain, nasal congestion, sore throat Cardiovascular: No chest pain, palpitations Respiratory:  No shortness of breath at rest or with exertion, wheezes GastrointestinaI: No nausea, vomiting, diarrhea, abdominal pain, fecal incontinence Genitourinary:  No dysuria, urinary retention or frequency Musculoskeletal:  No neck pain, back pain Integumentary: No rash, pruritus, skin lesions Neurological: as above Psychiatric: No depression, insomnia, anxiety Endocrine: No palpitations, fatigue, diaphoresis, mood swings, change in appetite, change in weight, increased thirst Hematologic/Lymphatic:  No purpura,  petechiae. Allergic/Immunologic: no itchy/runny eyes, nasal congestion, recent allergic reactions, rashes  PHYSICAL EXAM: Blood pressure 102/72, pulse 85, height 5' 5.5" (1.664 m), weight 209 lb (94.8 kg), SpO2 98 %. General: No acute distress.  Patient appears well-groomed.   Head:  Normocephalic/atraumatic Eyes:  Fundi examined but not visualized Neck: supple, no paraspinal tenderness, full range of motion Heart:  Regular rate and rhythm Lungs:  Clear to auscultation bilaterally Back: No paraspinal tenderness Neurological Exam: alert and oriented to person, place, and time. Attention span and concentration intact, recent and remote memory intact, fund of knowledge intact.  Speech fluent  and not dysarthric, language intact.  CN II-XII intact. Bulk and tone normal, muscle strength 5/5 throughout.  Sensation to light touch  intact.  Deep tendon reflexes 2+ throughout.  Finger to nose testing intact.  Gait normal, Romberg negative.  IMPRESSION: Migraine without aura, without status migrainosus, intractable a week prior to next Emgality injection  PLAN: 1. We will treat this like a menstrual migraine and prescribe a perimenstrual prophylaxis:  Naproxen 500mg  twice daily beginning one week prior to Emgality injection and continued for 14 days.  2. For preventative management, Emgality monthly and [name] 200mg  daily 3. Zembrace SymTouch for abortive therapy.  4. Limit use of pain relievers to no more than 2 days out of week to prevent risk of rebound or medication-overuse headache. 5.  Keep headache diary 6.  Exercise, hydration, caffeine cessation, sleep hygiene, monitor for and avoid triggers 7.  Consider:  magnesium citrate 400mg  daily, riboflavin 400mg  daily, and coenzyme Q10 100mg  three times daily 8. Will give Toradol shot today  9. Follow up in 4 months  Metta Clines, DO  CC: Mayra Neer, MD

## 2018-04-14 NOTE — Patient Instructions (Addendum)
1.  Start naproxen 500mg  twice daily beginning one week prior to Emgality injection and continue for total of 14 days. 2.  Continue Emgality monthly and [name] 200mg  at bedtime 3.  Zembrace SymTouch for otherwise abortive therapy 4.  Baclofen refilled 5. We will give you a Toradol injection today 6. Follow up in 4 months.

## 2018-04-14 NOTE — Addendum Note (Signed)
Addended by: Clois Comber on: 04/14/2018 03:07 PM   Modules accepted: Orders

## 2018-04-29 ENCOUNTER — Telehealth: Payer: Self-pay | Admitting: Neurology

## 2018-04-29 NOTE — Telephone Encounter (Signed)
Patient is calling in about continuing to battle a horrible migraine. She is needing some help and wanting to know about the hospital infusion. Please call her back at [phone]. Thanks!

## 2018-04-29 NOTE — Telephone Encounter (Signed)
Called Pt, LMOVM for return call

## 2018-04-29 NOTE — Telephone Encounter (Signed)
Pt rtrnd call. She wants to move forward with the infusions at the [facility] center you discussed at her last visit. Her headaches are debilitating and she wants relief. I advised her dr. Tomi Likens was out of the office this afternoon, however, we will be back in touch tomorrow.

## 2018-04-30 ENCOUNTER — Telehealth: Payer: Self-pay | Admitting: Neurology

## 2018-04-30 NOTE — Telephone Encounter (Signed)
Patient made aware. She will come for headache cocktail tomorrow morning.

## 2018-04-30 NOTE — Telephone Encounter (Signed)
We really try not to go that route.  I would recommend having somebody drive her here for a headache cocktail.  If ineffective, I would try prednisone taper.

## 2018-04-30 NOTE — Telephone Encounter (Signed)
Patient calling back regarding her Migraine.  Please See Previous phone note. Please Call. Thanks

## 2018-05-01 ENCOUNTER — Emergency Department (HOSPITAL_BASED_OUTPATIENT_CLINIC_OR_DEPARTMENT_OTHER)
Admission: EM | Admit: 2018-05-01 | Discharge: 2018-05-01 | Disposition: A | Discharge status: Home / Self Care | Payer: 59 | Attending: Emergency Medicine | Admitting: Emergency Medicine

## 2018-05-01 ENCOUNTER — Encounter (HOSPITAL_BASED_OUTPATIENT_CLINIC_OR_DEPARTMENT_OTHER): Payer: Self-pay | Admitting: Emergency Medicine

## 2018-05-01 ENCOUNTER — Ambulatory Visit (INDEPENDENT_AMBULATORY_CARE_PROVIDER_SITE_OTHER): Payer: 59

## 2018-05-01 ENCOUNTER — Other Ambulatory Visit: Payer: Self-pay

## 2018-05-01 DIAGNOSIS — Z87891 Personal history of nicotine dependence: Secondary | ICD-10-CM | POA: Diagnosis not present

## 2018-05-01 DIAGNOSIS — Z79899 Other long term (current) drug therapy: Secondary | ICD-10-CM | POA: Diagnosis not present

## 2018-05-01 DIAGNOSIS — F329 Major depressive disorder, single episode, unspecified: Secondary | ICD-10-CM | POA: Insufficient documentation

## 2018-05-01 DIAGNOSIS — R519 Headache, unspecified: Secondary | ICD-10-CM

## 2018-05-01 DIAGNOSIS — Z9049 Acquired absence of other specified parts of digestive tract: Secondary | ICD-10-CM | POA: Insufficient documentation

## 2018-05-01 DIAGNOSIS — R51 Headache: Secondary | ICD-10-CM | POA: Insufficient documentation

## 2018-05-01 MED ORDER — KETOROLAC TROMETHAMINE 60 MG/2ML IM SOLN
60.0000 mg | Freq: Once | INTRAMUSCULAR | Status: AC
  Administered 2018-05-01: 60 mg via INTRAMUSCULAR

## 2018-05-01 MED ORDER — SODIUM CHLORIDE 0.9 % IV BOLUS
1000.0000 mL | Freq: Once | INTRAVENOUS | Status: AC
  Administered 2018-05-01: 1000 mL via INTRAVENOUS

## 2018-05-01 MED ORDER — DIPHENHYDRAMINE HCL 50 MG/ML IJ SOLN
50.0000 mg | Freq: Once | INTRAMUSCULAR | Status: AC
  Administered 2018-05-01: 50 mg via INTRAMUSCULAR

## 2018-05-01 MED ORDER — DEXAMETHASONE SODIUM PHOSPHATE 10 MG/ML IJ SOLN
10.0000 mg | Freq: Once | INTRAMUSCULAR | Status: AC
  Administered 2018-05-01: 10 mg via INTRAVENOUS
  Filled 2018-05-01: qty 1

## 2018-05-01 MED ORDER — PROCHLORPERAZINE EDISYLATE 10 MG/2ML IJ SOLN
5.0000 mg | Freq: Once | INTRAMUSCULAR | Status: AC
  Administered 2018-05-01: 5 mg via INTRAVENOUS
  Filled 2018-05-01: qty 2

## 2018-05-01 MED ORDER — DIPHENHYDRAMINE HCL 50 MG/ML IJ SOLN
25.0000 mg | Freq: Once | INTRAMUSCULAR | Status: AC
  Administered 2018-05-01: 25 mg via INTRAVENOUS
  Filled 2018-05-01: qty 1

## 2018-05-01 NOTE — ED Notes (Signed)
ED Provider at bedside. 

## 2018-05-01 NOTE — ED Triage Notes (Signed)
Patient states that she went to the Neurologist this am and was sent here because nothing has helped and she states her neurolgist states " you need IV cocktail" - patient reports nausea

## 2018-05-01 NOTE — Discharge Instructions (Addendum)
You have been diagnosed today with Headache.  At this time there does not appear to be the presence of an emergent medical condition, however there is always the potential for conditions to change. Please read and follow the below instructions.  Please return to the Emergency Department immediately for any new or worsening symptoms. Please be sure to follow up with your Primary Care Provider within one week regarding your visit today; please call their office to schedule an appointment even if you are feeling better for a follow-up visit. Please call your neurologists office on Monday to schedule a follow-up appointment regarding your migraines. Please be sure to drink plenty water and get plenty rest to help with your symptoms.  Get help right away if: Your migraine gets very bad. You have a fever. You have a stiff neck. You have trouble seeing. Your muscles feel weak or like you cannot control them. You start to lose your balance a lot. You start to have trouble walking. You pass out (faint). You have symptoms that are different from your normal migraines. Any new or concerning symptoms  Please read the additional information packets attached to your discharge summary.

## 2018-05-01 NOTE — ED Provider Notes (Signed)
[facility] EMERGENCY DEPARTMENT Provider Note   CSN: [id number] Arrival date & time: 05/01/18  1651    History   Chief Complaint Chief Complaint  Patient presents with  . Headache    HPI Jody Blackwell is a 53 y.o. female presenting today for migraine.  Patient with history of migraines and states that she has had her current migraine for the past 3 weeks.  Patient states that this feels exactly like her typical migraine, she states that it started in her right occipital area and has since encompassed her entire head.  Patient states that she has had migraines in the past that have lasted longer than 3 weeks.  Patient states that she is also had more severe headaches in the past.   At time my evaluation patient describes a generalized headache throbbing severe constant worsened with bright light and loud noises and improved slightly with rest.  Patient reports that she has some tingling sensation around her left face and states that this is normal with her migraines when they become severe and this resolves with resolution of migraine. ----- Neurology note reviewed from 04/14/2018 which shows documentation of patient with left facial numbness when migraine is severe. ---- Patient was seen at her neurologist office today and given 60 mg Toradol and 50 mg of Benadryl with temporary relief.  Patient states that her headache had never gone away completely and again worsened around 5 PM.  They encouraged her to come to the ED for a migraine cocktail.  Patient denies history of trauma, fever, recent nausea/vomiting, vision changes, neck stiffness, chest pain, abdominal pain, shortness of breath, weakness of the extremities or any additional concerns.    HPI  Past Medical History:  Diagnosis Date  . Arthritis    OA in hands, hip and feet  . Chronic sinusitis   . Complication of anesthesia   . Depression   . GERD (gastroesophageal reflux disease)   . Joint pain   .  Migraine   . PONV (postoperative nausea and vomiting)     Patient Active Problem List   Diagnosis Date Noted  . Peroneal tendon tear 07/20/2015  . Left ankle pain 08/03/2010  . Left shoulder pain 06/29/2010    Past Surgical History:  Procedure Laterality Date  . ABDOMINAL HYSTERECTOMY    . ANKLE RECONSTRUCTION Right 07/20/2015   Procedure: OPEN EXPLORATION OF LATERAL RIGHT PERONEUS BREVIS TENDON WITH REPAIR ;  Surgeon: Garald Balding, MD;  Location: [facility];  Service: Orthopedics;  Laterality: Right;  . CESAREAN SECTION     x 2  . CHOLECYSTECTOMY  2012   lap choli  . MASS EXCISION  06/03/2011   Procedure: EXCISION MASS;  Surgeon: Schuyler Amor, MD;  Location: [facility];  Service: Orthopedics;  Laterality: Right;  EXCISION MASS RIGHT MIDDLE FINGER   . NASAL SINUS SURGERY  2007, 2010     OB History   No obstetric history on file.      Home Medications    Prior to Admission medications   Medication Sig Start Date End Date Taking? Authorizing Provider  albuterol ([name];[name]) 108 (90 Base) MCG/ACT inhaler     [provider]  baclofen ([name]) 10 MG tablet Take 1 tablet (10 mg total) by mouth 3 (three) times daily as needed for muscle spasms. 04/14/18   Tomi Likens, Adam R, DO  benzonatate (TESSALON) 100 MG capsule Take 1-2 capsules (100-200 mg total) by mouth 3 (three)  times daily as needed for cough. 03/02/18   Shella Maxim, NP  Cholecalciferol ([name]) 2000 units CAPS Take 1 capsule by mouth daily.    [provider]  diclofenac (VOLTAREN) 50 MG EC tablet Take 1 tablet (50 mg total) by mouth as needed. 02/16/18   Pieter Partridge, DO  Diclofenac Sodium 3 % GEL Place 1 Dose onto the skin as needed. 02/16/18   Pieter Partridge, DO  escitalopram (LEXAPRO) 10 MG tablet  01/10/18   [provider]  esomeprazole ([name]) 40 MG capsule Take 40 mg by mouth daily at 12 noon.    [provider]    Galcanezumab-gnlm (EMGALITY) 120 MG/ML SOSY Inject 120 mg into the skin every 30 (thirty) days. 01/12/18   Tomi Likens, Adam R, DO  Galcanezumab-gnlm (EMGALITY) 120 MG/ML SOSY Inject 120 mg into the skin every 30 (thirty) days. 01/12/18   Pieter Partridge, DO  magnesium oxide ([name]) 400 MG tablet Take 400 mg by mouth daily.    [provider]  naproxen (NAPROSYN) 500 MG tablet Twice daily beginning 1 week prior to Emgality injection for 14 days 04/14/18   Metta Clines R, DO  ondansetron (ZOFRAN) 8 MG tablet Take 8 mg by mouth as needed for nausea or vomiting.    [provider]  ranitidine ([name]) 150 MG capsule Take 150 mg by mouth daily.     [provider]  SUMAtriptan Succinate (ZEMBRACE SYMTOUCH) 3 MG/0.5ML SOAJ Inject 3 mg into the skin as directed. May repeat in 1 hr if needed, no more than 2 in 24 hrs 01/12/18   Pieter Partridge, DO  SUMAtriptan Succinate (ZEMBRACE SYMTOUCH) 3 MG/0.5ML SOAJ Inject 3 mg into the skin as directed. Inject 3 mg into the skin as directed. May repeat in 1 hr if needed, no more than 2 in 24 hrs 02/03/18   Pieter Partridge, DO  topiramate ER ([name]) 200 MG CS24 sprinkle capsule Take 1 capsule (200 mg total) by mouth at bedtime. 11/14/17   Pieter Partridge, DO  topiramate ER ([name]) CS24 sprinkle capsule Take 1 capsule (100 mg total) by mouth daily. Patient taking differently: Take 200 mg by mouth daily.  10/07/17   Pieter Partridge, DO  triamcinolone cream (KENALOG) 0.1 % Apply 1 application topically as needed.    [provider]    Family History Family History  Problem Relation Age of Onset  . Diabetes Mother   . Diabetes Father   . Heart attack Father   . Hypertension Father   . Diabetes Maternal Grandfather   . Heart attack Maternal Grandfather   . Hypertension Maternal Grandfather   . Diabetes Paternal Grandfather   . Heart attack Paternal Grandfather   . Hypertension Paternal Grandfather   . High blood pressure Brother      Social History Social History   Tobacco Use  . Smoking status: Former Smoker    Packs/day: 0.25    Years: 5.00    Pack years: 1.25    Types: Cigarettes    Last attempt to quit: 05/30/1989    Years since quitting: 28.9  . Smokeless tobacco: Never Used  . Tobacco comment: social smoker per pt  Substance Use Topics  . Alcohol use: Yes    Comment: occasionally  . Drug use: No     Allergies   Aspirin; Ibuprofen; Metoclopramide hcl; Sulfa antibiotics; Triptans; Zithromax [azithromycin dihydrate]; and Dilaudid [hydromorphone hcl]   Review of Systems Review of Systems  Constitutional: Negative.  Negative for chills and fever.  Eyes: Negative.  Negative for visual disturbance.  Respiratory: Negative.  Negative for shortness of breath.   Cardiovascular: Negative.  Negative for chest pain.  Gastrointestinal: Negative.  Negative for abdominal pain and vomiting.  Musculoskeletal: Negative.  Negative for arthralgias, back pain, myalgias and neck stiffness.  Skin: Negative.  Negative for rash.  Neurological: Positive for numbness (Facial tingling (reports normal for her migraines)) and headaches. Negative for syncope, speech difficulty and weakness.  All other systems reviewed and are negative.  Physical Exam Updated Vital Signs BP (!) 111/59 (BP Location: Right Arm)   Pulse (!) 59   Temp 97.7 F (36.5 C) (Oral)   Resp 16   Ht 5\' 5"  (1.651 m)   Wt 93 kg   SpO2 100%   BMI 34.11 kg/m   Physical Exam Constitutional:      General: She is not in acute distress.    Appearance: Normal appearance. She is well-developed. She is not ill-appearing or diaphoretic.  HENT:     Head: Normocephalic and atraumatic. No raccoon eyes, Battle's sign, abrasion or contusion.     Jaw: There is normal jaw occlusion. No trismus.     Comments: No pain of the temporal arteries    Right Ear: Tympanic membrane, ear canal and external ear normal. No hemotympanum.     Left Ear: Tympanic membrane, ear  canal and external ear normal. No hemotympanum.     Ears:     Comments: Hearing grossly intact bilaterally    Nose: Nose normal. No rhinorrhea.     Right Nostril: No epistaxis.     Left Nostril: No epistaxis.     Right Sinus: No maxillary sinus tenderness or frontal sinus tenderness.     Left Sinus: No maxillary sinus tenderness or frontal sinus tenderness.     Mouth/Throat:     Lips: Pink.     Mouth: Mucous membranes are moist.     Pharynx: Oropharynx is clear. Uvula midline.  Eyes:     General: Vision grossly intact. Gaze aligned appropriately.     Extraocular Movements: Extraocular movements intact.     Conjunctiva/sclera: Conjunctivae normal.     Pupils: Pupils are equal, round, and reactive to light.     Comments: Visual fields grossly intact bilaterally  Neck:     Musculoskeletal: Full passive range of motion without pain, normal range of motion and neck supple. No neck rigidity.     Trachea: Trachea and phonation normal. No tracheal tenderness or tracheal deviation.     Meningeal: Brudzinski's sign and Kernig's sign absent.  Cardiovascular:     Rate and Rhythm: Normal rate and regular rhythm.     Pulses:          Dorsalis pedis pulses are 2+ on the right side and 2+ on the left side.       Posterior tibial pulses are 2+ on the right side and 2+ on the left side.     Heart sounds: Normal heart sounds.  Pulmonary:     Effort: Pulmonary effort is normal. No respiratory distress.     Breath sounds: Normal breath sounds and air entry.  Abdominal:     General: Bowel sounds are normal. There is no distension.     Palpations: Abdomen is soft.     Tenderness: There is no abdominal tenderness. There is no guarding or rebound.  Musculoskeletal:     Comments: No midline C/T/L spinal tenderness to palpation, no  paraspinal muscle tenderness, no deformity, crepitus, or step-off noted. No sign of injury to the neck or back.  Feet:     Right foot:     Protective Sensation: 3 sites  tested. 3 sites sensed.     Left foot:     Protective Sensation: 3 sites tested. 3 sites sensed.  Skin:    General: Skin is warm and dry.  Neurological:     Mental Status: She is alert and oriented to person, place, and time.     GCS: GCS eye subscore is 4. GCS verbal subscore is 5. GCS motor subscore is 6.     Comments: Mental Status: Alert, oriented, thought content appropriate, able to give a coherent history. Speech fluent without evidence of aphasia. Able to follow 2 step commands without difficulty. Cranial Nerves: II: Peripheral visual fields grossly normal, pupils equal, round, reactive to light III,IV, VI: ptosis not present, extra-ocular motions intact bilaterally V,VII: smile symmetric, eyebrows raise symmetric, facial light touch sensation equal VIII: hearing grossly normal to voice X: uvula elevates symmetrically XI: bilateral shoulder shrug symmetric and strong XII: midline tongue extension without fassiculations Motor: Normal tone. 5/5 strength in upper and lower extremities bilaterally including strong and equal grip strength and dorsiflexion/plantar flexion Sensory: Sensation intact to light touch in all extremities.Negative Romberg.  Deep Tendon Reflexes: 2+ and symmetric in the biceps and patella.  No clonus at the feet. Cerebellar: normal finger-to-nose maze with bilateral upper extremities. Normal heel-to -shin balance bilaterally of the lower extremity. No pronator drift.  Gait: normal gait and balance CV: distal pulses palpable throughout  Psychiatric:        Mood and Affect: Mood normal.        Behavior: Behavior is cooperative.      ED Treatments / Results  Labs (all labs ordered are listed, but only abnormal results are displayed) Labs Reviewed - No data to display  EKG None  Radiology No results found.  Procedures Procedures (including critical care time)  Medications Ordered in ED Medications  diphenhydrAMINE (BENADRYL) injection 25  mg (25 mg Intravenous Given 05/01/18 1814)  dexamethasone (DECADRON) injection 10 mg (10 mg Intravenous Given 05/01/18 1806)  prochlorperazine (COMPAZINE) injection 5 mg (5 mg Intravenous Given 05/01/18 1807)  sodium chloride 0.9 % bolus 1,000 mL (0 mLs Intravenous Stopped 05/01/18 1941)     Initial Impression / Assessment and Plan / ED Course  I have reviewed the triage vital signs and the nursing notes.  Pertinent labs & imaging results that were available during my care of the patient were reviewed by me and considered in my medical decision making (see chart for details).    Jody Blackwell is a 53 y.o. female who presents to ED for migraine.  Patient states that this migraine is typical for her migraines in the past.  He denies abnormal symptoms today.  She does report intermittent facial tingling with her migraine but it is severe she states that this is normal, neurology notes reviewed which shows that this is documented.  Physical Examination reassuring and without focal neuro deficits.  No meningeal signs.  No signs of trauma.  No temporal artery pain.  No anterior neck pain.  Imaging not indicated - Patient given Toradol earlier today, will avoid further NSAIDs.  Benadryl, Compazine and Decadron given with fluid bolus.  Patient reevaluated sleeping comfortably easily arousable to voice.  She states resolution of her headache today.  She is now requesting discharge. The patient denies any neurologic  symptoms such as visual changes, focal weakness, balance problems, confusion, or speech difficulty to suggest a life-threatening intracranial process such as intracranial hemorrhage or mass. The patient has no clotting risk factors thus venous sinus thrombosis is unlikely. No fevers, neck pain or nuchal rigidity to suggest meningitis. Patient is afebrile, non-toxic and well appearing.  Reassuring neuro exam, normal gait around room and back. No cranial deficits, no speech deficits, negative  pronator drift, normal/equal strength to all extremities.   Urology follow-up encouraged.  Patient and her husband state understanding. I have reviewed return precautions including development of fever, abnormal migraine symptoms, nausea/vomiting or neurologic symptoms, vision changes, confusion, lethargy, difficulty speaking/walking, or other new/worsening/concerning symptoms. Patient states understanding of return precautions.  At this time there does not appear to be any evidence of an acute emergency medical condition and the patient appears stable for discharge with appropriate outpatient follow up. Diagnosis was discussed with patient who verbalizes understanding of care plan and is agreeable to discharge. I have discussed return precautions with patient and husband who verbalize understanding of return precautions. Patient strongly encouraged to follow-up with their PCP and neuro. All questions answered.  Patient's case discussed with Dr. Maryan Rued who agrees with plan to discharge with follow-up.   Note: Portions of this report may have been transcribed using voice recognition software. Every effort was made to ensure accuracy; however, inadvertent computerized transcription errors may still be present.  Final Clinical Impressions(s) / ED Diagnoses   Final diagnoses:  Bad headache    ED Discharge Orders    None       Gari Crown 05/01/18 Ladell Heads, MD 05/01/18 2155

## 2018-05-04 NOTE — Progress Notes (Signed)
PA sent to plan for pt's Emgality 120MG /ML auto-injectors

## 2018-05-04 NOTE — Telephone Encounter (Signed)
Jaffe patient.  

## 2018-05-07 ENCOUNTER — Telehealth: Payer: Self-pay | Admitting: Neurology

## 2018-05-07 ENCOUNTER — Other Ambulatory Visit: Payer: Self-pay

## 2018-05-07 ENCOUNTER — Encounter (HOSPITAL_BASED_OUTPATIENT_CLINIC_OR_DEPARTMENT_OTHER): Payer: Self-pay | Admitting: Emergency Medicine

## 2018-05-07 ENCOUNTER — Emergency Department (HOSPITAL_BASED_OUTPATIENT_CLINIC_OR_DEPARTMENT_OTHER)
Admission: EM | Admit: 2018-05-07 | Discharge: 2018-05-07 | Disposition: A | Discharge status: Home / Self Care | Payer: 59 | Attending: Emergency Medicine | Admitting: Emergency Medicine

## 2018-05-07 DIAGNOSIS — Y999 Unspecified external cause status: Secondary | ICD-10-CM | POA: Insufficient documentation

## 2018-05-07 DIAGNOSIS — X58XXXA Exposure to other specified factors, initial encounter: Secondary | ICD-10-CM | POA: Diagnosis not present

## 2018-05-07 DIAGNOSIS — T162XXA Foreign body in left ear, initial encounter: Secondary | ICD-10-CM | POA: Insufficient documentation

## 2018-05-07 DIAGNOSIS — G43001 Migraine without aura, not intractable, with status migrainosus: Secondary | ICD-10-CM

## 2018-05-07 DIAGNOSIS — G43019 Migraine without aura, intractable, without status migrainosus: Secondary | ICD-10-CM

## 2018-05-07 DIAGNOSIS — Z87891 Personal history of nicotine dependence: Secondary | ICD-10-CM | POA: Insufficient documentation

## 2018-05-07 DIAGNOSIS — Z79899 Other long term (current) drug therapy: Secondary | ICD-10-CM | POA: Diagnosis not present

## 2018-05-07 DIAGNOSIS — Y939 Activity, unspecified: Secondary | ICD-10-CM | POA: Diagnosis not present

## 2018-05-07 DIAGNOSIS — G43009 Migraine without aura, not intractable, without status migrainosus: Secondary | ICD-10-CM | POA: Diagnosis not present

## 2018-05-07 DIAGNOSIS — Y929 Unspecified place or not applicable: Secondary | ICD-10-CM | POA: Insufficient documentation

## 2018-05-07 DIAGNOSIS — R51 Headache: Secondary | ICD-10-CM | POA: Diagnosis present

## 2018-05-07 MED ORDER — DEXAMETHASONE SODIUM PHOSPHATE 10 MG/ML IJ SOLN
10.0000 mg | Freq: Once | INTRAMUSCULAR | Status: AC
  Administered 2018-05-07: 10 mg via INTRAVENOUS
  Filled 2018-05-07: qty 1

## 2018-05-07 MED ORDER — DIPHENHYDRAMINE HCL 50 MG/ML IJ SOLN
25.0000 mg | Freq: Once | INTRAMUSCULAR | Status: AC
  Administered 2018-05-07: 25 mg via INTRAVENOUS
  Filled 2018-05-07: qty 1

## 2018-05-07 MED ORDER — SODIUM CHLORIDE 0.9 % IV BOLUS
1000.0000 mL | Freq: Once | INTRAVENOUS | Status: AC
  Administered 2018-05-07: 1000 mL via INTRAVENOUS

## 2018-05-07 MED ORDER — KETOROLAC TROMETHAMINE 30 MG/ML IJ SOLN
30.0000 mg | Freq: Once | INTRAMUSCULAR | Status: AC
  Administered 2018-05-07: 30 mg via INTRAVENOUS
  Filled 2018-05-07: qty 1

## 2018-05-07 MED ORDER — PROCHLORPERAZINE EDISYLATE 10 MG/2ML IJ SOLN
5.0000 mg | Freq: Once | INTRAMUSCULAR | Status: AC
  Administered 2018-05-07: 5 mg via INTRAVENOUS
  Filled 2018-05-07: qty 2

## 2018-05-07 NOTE — Telephone Encounter (Signed)
Patient has

## 2018-05-07 NOTE — ED Triage Notes (Signed)
Reports migraine since last night.  Reports nausea, dizziness, and photophobia.

## 2018-05-07 NOTE — Telephone Encounter (Signed)
Pt has had headache cocktails recently, she has used her Zembrace twice since last night.   On 04/29/18, Prednisone taper was an option if cocktail was not effective. Would that be appropriate now?

## 2018-05-07 NOTE — Discharge Instructions (Signed)
Continue your migraine therapy as per usual at home.  Return to the emergency department if your symptoms are worsening or changing. You have a small hair in your left ear canal.  You may try rinsing this several times under a brisk shower.  If it persists, schedule an appointment with the ear nose throat doctor to have it removed.

## 2018-05-07 NOTE — ED Notes (Signed)
ED Provider at bedside. 

## 2018-05-07 NOTE — Telephone Encounter (Signed)
OK to give prednisone 60mg  and taper by 10mg  each day over one week.

## 2018-05-07 NOTE — ED Provider Notes (Signed)
[facility] EMERGENCY DEPARTMENT Provider Note   CSN: [id number] Arrival date & time: 05/07/18  1747    History   Chief Complaint Chief Complaint  Patient presents with  . Headache    HPI Jody Blackwell is a 53 y.o. female.     HPI Patient reports she is having her typical migraine.  This is to the back right side.  No associated fever or chills.  No associated neck stiffness.  No vomiting.  No weakness numbness or tingling.  Patient reports that she has been getting these headaches now that started about a week before her Emgality shot.  This has become a recurrent problem.  She is due for her injection in 5 days.  Patient requested to look into her left ear.  She reports she is had a weird scratching and clicking that is kind of been driving her crazy.  Is not painful. Past Medical History:  Diagnosis Date  . Arthritis    OA in hands, hip and feet  . Chronic sinusitis   . Complication of anesthesia   . Depression   . GERD (gastroesophageal reflux disease)   . Joint pain   . Migraine   . PONV (postoperative nausea and vomiting)     Patient Active Problem List   Diagnosis Date Noted  . Peroneal tendon tear 07/20/2015  . Left ankle pain 08/03/2010  . Left shoulder pain 06/29/2010    Past Surgical History:  Procedure Laterality Date  . ABDOMINAL HYSTERECTOMY    . ANKLE RECONSTRUCTION Right 07/20/2015   Procedure: OPEN EXPLORATION OF LATERAL RIGHT PERONEUS BREVIS TENDON WITH REPAIR ;  Surgeon: Garald Balding, MD;  Location: [facility];  Service: Orthopedics;  Laterality: Right;  . CESAREAN SECTION     x 2  . CHOLECYSTECTOMY  2012   lap choli  . MASS EXCISION  06/03/2011   Procedure: EXCISION MASS;  Surgeon: Schuyler Amor, MD;  Location: [facility];  Service: Orthopedics;  Laterality: Right;  EXCISION MASS RIGHT MIDDLE FINGER   . NASAL SINUS SURGERY  2007, 2010     OB History   No obstetric history on file.       Home Medications    Prior to Admission medications   Medication Sig Start Date End Date Taking? Authorizing Provider  albuterol ([name];[name]) 108 (90 Base) MCG/ACT inhaler     [provider]  baclofen ([name]) 10 MG tablet Take 1 tablet (10 mg total) by mouth 3 (three) times daily as needed for muscle spasms. 04/14/18   Tomi Likens, Adam R, DO  benzonatate (TESSALON) 100 MG capsule Take 1-2 capsules (100-200 mg total) by mouth 3 (three) times daily as needed for cough. 03/02/18   Shella Maxim, NP  Cholecalciferol ([name]) 2000 units CAPS Take 1 capsule by mouth daily.    [provider]  diclofenac (VOLTAREN) 50 MG EC tablet Take 1 tablet (50 mg total) by mouth as needed. 02/16/18   Pieter Partridge, DO  Diclofenac Sodium 3 % GEL Place 1 Dose onto the skin as needed. 02/16/18   Pieter Partridge, DO  escitalopram (LEXAPRO) 10 MG tablet  01/10/18   [provider]  esomeprazole ([name]) 40 MG capsule Take 40 mg by mouth daily at 12 noon.    [provider]  Galcanezumab-gnlm (EMGALITY) 120 MG/ML SOSY Inject 120 mg into the skin every 30 (thirty) days. 01/12/18   Tomi Likens, Adam R, DO  Galcanezumab-gnlm (EMGALITY) 120  MG/ML SOSY Inject 120 mg into the skin every 30 (thirty) days. 01/12/18   Pieter Partridge, DO  magnesium oxide ([name]) 400 MG tablet Take 400 mg by mouth daily.    [provider]  naproxen (NAPROSYN) 500 MG tablet Twice daily beginning 1 week prior to Emgality injection for 14 days 04/14/18   Metta Clines R, DO  ondansetron (ZOFRAN) 8 MG tablet Take 8 mg by mouth as needed for nausea or vomiting.    [provider]  ranitidine ([name]) 150 MG capsule Take 150 mg by mouth daily.     [provider]  SUMAtriptan Succinate (ZEMBRACE SYMTOUCH) 3 MG/0.5ML SOAJ Inject 3 mg into the skin as directed. May repeat in 1 hr if needed, no more than 2 in 24 hrs 01/12/18   Pieter Partridge, DO  SUMAtriptan Succinate (ZEMBRACE SYMTOUCH)  3 MG/0.5ML SOAJ Inject 3 mg into the skin as directed. Inject 3 mg into the skin as directed. May repeat in 1 hr if needed, no more than 2 in 24 hrs 02/03/18   Pieter Partridge, DO  topiramate ER ([name]) 200 MG CS24 sprinkle capsule Take 1 capsule (200 mg total) by mouth at bedtime. 11/14/17   Pieter Partridge, DO  topiramate ER ([name]) CS24 sprinkle capsule Take 1 capsule (100 mg total) by mouth daily. Patient taking differently: Take 200 mg by mouth daily.  10/07/17   Pieter Partridge, DO  triamcinolone cream (KENALOG) 0.1 % Apply 1 application topically as needed.    [provider]    Family History Family History  Problem Relation Age of Onset  . Diabetes Mother   . Diabetes Father   . Heart attack Father   . Hypertension Father   . Diabetes Maternal Grandfather   . Heart attack Maternal Grandfather   . Hypertension Maternal Grandfather   . Diabetes Paternal Grandfather   . Heart attack Paternal Grandfather   . Hypertension Paternal Grandfather   . High blood pressure Brother     Social History Social History   Tobacco Use  . Smoking status: Former Smoker    Packs/day: 0.25    Years: 5.00    Pack years: 1.25    Types: Cigarettes    Last attempt to quit: 05/30/1989    Years since quitting: 28.9  . Smokeless tobacco: Never Used  . Tobacco comment: social smoker per pt  Substance Use Topics  . Alcohol use: Yes    Comment: occasionally  . Drug use: No     Allergies   Aspirin; Ibuprofen; Metoclopramide hcl; Sulfa antibiotics; Triptans; Zithromax [azithromycin dihydrate]; and Dilaudid [hydromorphone hcl]   Review of Systems Review of Systems 10 Systems reviewed and are negative for acute change except as noted in the HPI.   Physical Exam Updated Vital Signs BP 107/76 (BP Location: Left Arm)   Pulse 73   Temp 97.6 F (36.4 C) (Oral)   Resp 18   Ht 5\' 5"  (1.651 m)   Wt 93 kg   SpO2 100%   BMI 34.11 kg/m   Physical Exam Constitutional:      Comments:  Alert nontoxic and clinically well in appearance.  HENT:     Head: Normocephalic and atraumatic.     Ears:     Comments: Patient has a small piece of head hair lying in her ear canal close to the drum.  Otherwise normal left ear.  Right is normal.    Nose: Nose normal.  Mouth/Throat:     Mouth: Mucous membranes are moist.     Pharynx: Oropharynx is clear.  Eyes:     Extraocular Movements: Extraocular movements intact.     Conjunctiva/sclera: Conjunctivae normal.     Pupils: Pupils are equal, round, and reactive to light.  Cardiovascular:     Rate and Rhythm: Normal rate and regular rhythm.  Pulmonary:     Effort: Pulmonary effort is normal.     Breath sounds: Normal breath sounds.  Abdominal:     General: There is no distension.     Palpations: Abdomen is soft.     Tenderness: There is no abdominal tenderness. There is no guarding.  Musculoskeletal: Normal range of motion.        General: No swelling or tenderness.  Skin:    General: Skin is warm and dry.  Neurological:     General: No focal deficit present.     Mental Status: She is oriented to person, place, and time.     Motor: No weakness.     Coordination: Coordination normal.  Psychiatric:        Mood and Affect: Mood normal.      ED Treatments / Results  Labs (all labs ordered are listed, but only abnormal results are displayed) Labs Reviewed - No data to display  EKG None  Radiology No results found.  Procedures Procedures (including critical care time)  Medications Ordered in ED Medications  prochlorperazine (COMPAZINE) injection 5 mg (5 mg Intravenous Given 05/07/18 1929)  diphenhydrAMINE (BENADRYL) injection 25 mg (25 mg Intravenous Given 05/07/18 1922)  ketorolac (TORADOL) 30 MG/ML injection 30 mg (30 mg Intravenous Given 05/07/18 1926)  dexamethasone (DECADRON) injection 10 mg (10 mg Intravenous Given 05/07/18 1933)  sodium chloride 0.9 % bolus 1,000 mL (1,000 mLs Intravenous New Bag/Given 05/07/18  1926)     Initial Impression / Assessment and Plan / ED Course  I have reviewed the triage vital signs and the nursing notes.  Pertinent labs & imaging results that were available during my care of the patient were reviewed by me and considered in my medical decision making (see chart for details).       Patient is treated with migraine therapy.  With recheck she reports feeling significantly improved.  At this time findings are consistent with her chronic migrainous headache.  Patient incidentally had a small piece of hair in the left ear canal.  I did attempt to remove it with a curette but unable to extract as it is very close to the drum.  This is a non-critical situation at this time.  Patient counseled to try to irrigate at home with direct water from the shower head and follow-up with ENT if symptoms persist.  Final Clinical Impressions(s) / ED Diagnoses   Final diagnoses:  Migraine without aura and with status migrainosus, not intractable    ED Discharge Orders    None       Charlesetta Shanks, MD 05/07/18 2026

## 2018-05-07 NOTE — Telephone Encounter (Signed)
Patient has another migraine and has already taken 2 Zimbrace injections and 2 Darflownec?[name] Kitchen She was wanting to know what was the next step. Please call her back at [phone]. Thanks!

## 2018-05-08 MED ORDER — PREDNISONE 10 MG (21) PO TBPK: ORAL_TABLET | ORAL | 0 refills | Status: DC

## 2018-05-08 NOTE — Telephone Encounter (Signed)
Called and advised Pt of Prednisone to be sent in. Advised her of 6,5,4,3,2,1 dosong not to take any other NSAIDS while on the Prednisone

## 2018-05-12 NOTE — Progress Notes (Signed)
Rcvd fax from [facility]. Emgality approved 05/04/18 - 08/04/18

## 2018-05-15 ENCOUNTER — Ambulatory Visit (INDEPENDENT_AMBULATORY_CARE_PROVIDER_SITE_OTHER): Payer: 59

## 2018-05-15 ENCOUNTER — Telehealth: Payer: Self-pay | Admitting: Neurology

## 2018-05-15 ENCOUNTER — Other Ambulatory Visit: Payer: Self-pay

## 2018-05-15 DIAGNOSIS — G43019 Migraine without aura, intractable, without status migrainosus: Secondary | ICD-10-CM | POA: Diagnosis not present

## 2018-05-15 MED ORDER — DIPHENHYDRAMINE HCL 50 MG/ML IJ SOLN
25.0000 mg | Freq: Once | INTRAMUSCULAR | Status: AC
  Administered 2018-05-15: 25 mg via INTRAMUSCULAR

## 2018-05-15 MED ORDER — KETOROLAC TROMETHAMINE 60 MG/2ML IM SOLN
60.0000 mg | Freq: Once | INTRAMUSCULAR | Status: AC
  Administered 2018-05-15: 60 mg via INTRAMUSCULAR

## 2018-05-15 NOTE — Telephone Encounter (Signed)
Pt came for Headache cocktail and picked up samples of Cambia

## 2018-05-15 NOTE — Telephone Encounter (Signed)
Patient called regarding a continuous migraine that she has had. Please Call. Thanks

## 2018-05-15 NOTE — Telephone Encounter (Signed)
Called and LMOVM advising Pt I had discussed with Dr Tomi Likens and he suggested (Cambia) diclofenac powder and that we have samples. I asked that she return the call.

## 2018-05-26 ENCOUNTER — Ambulatory Visit: Payer: 59 | Admitting: Neurology

## 2018-05-26 ENCOUNTER — Encounter

## 2018-06-02 ENCOUNTER — Other Ambulatory Visit: Payer: Self-pay

## 2018-06-02 MED ORDER — DICLOFENAC POTASSIUM(MIGRAINE) 50 MG PO PACK: 50.0000 mg | PACK | ORAL | 5 refills | Status: DC

## 2018-06-12 ENCOUNTER — Telehealth: Payer: Self-pay | Admitting: Neurology

## 2018-06-12 NOTE — Telephone Encounter (Signed)
Patient notified that I will check on her [place] PA.

## 2018-06-12 NOTE — Telephone Encounter (Signed)
Patient called regarding her medication and it needing Prior Auth. She said she sent messages on 06/02/2018 and 06/07/2018. Please Refer to those messages. Please Call. Thanks

## 2018-06-15 ENCOUNTER — Telehealth: Payer: Self-pay | Admitting: *Deleted

## 2018-06-15 ENCOUNTER — Encounter: Payer: Self-pay | Admitting: *Deleted

## 2018-06-15 NOTE — Telephone Encounter (Signed)
Called Jody Blackwell and let her know the medication was approved and it should be ready for pickup later today.

## 2018-06-15 NOTE — Progress Notes (Signed)
Called [facility] pharmacy on file to let them know cover my meds said it did not need approval. They looked at their notes and stated it was not an approved med via [place]. They gave the phone number [phone] which was on their notes. She suggested I call [employer] and request a Plan exclusion of medically necessary. I called to appeal and answered a couple of questions over the phone.  Approval #[id number] from 06/15/2018-06/15/2019.  Both [facility] and [place] should receive a faxed confirmation of this approval in the next few minutes.  I will call patient and let her know that it was approved.

## 2018-06-15 NOTE — Progress Notes (Addendum)
[place] DANA [place] Key: [id number] Need help? Call us at [phone]  Outcome  Additional Information Required  Your PA has been resolved, no additional PA is required. For further inquiries please contact the number on the back of the member prescription card. (Message [phone])  Questions? We're right here! Type to chat with [place] or call us at [phone]. ME: What does this mean? Your PA has been resolved Message [phone]  Aida Raider.: Hello!  Aida Raider.: Normally that message means a PA is not needed. Do you happen to have the 8-character key for this request? I can take a look!  ME: [id number] The patient has called saying the pharmacy is waiting for the PA  Aida Raider.: In this case, I would recommend contacting the plan at [phone] to see where the disconnect is. They'll have better insight into that message :)  ME: ok  Aida Raider.: Is there anything else I may assist with today?   DANA [place] Key: [id number] Need help? Call us at [phone]  Status  Sent to [employer]  DrugDiclofenac Potassium 50MG  tablets  Therapist, sports PA Form

## 2018-06-16 NOTE — Telephone Encounter (Signed)
Patient calling back regarding pharmacy needing the prior auth #. They have tried several times and it's not going through. She uses [facility] in [place]. She said she has a really bad Migraine. Thanks

## 2018-06-17 NOTE — Telephone Encounter (Signed)
Called and spoke with Pt to confirm she was able to pick up her Cambia as it was approved. Pt was able to get medication.

## 2018-07-30 ENCOUNTER — Telehealth: Payer: Self-pay

## 2018-07-30 NOTE — Telephone Encounter (Signed)
Prior authorization initiated via covermymeds for Jody Blackwell.  Waiting response.

## 2018-07-31 NOTE — Progress Notes (Signed)
Rcvd fax from [facility], criteria questions. Completing and faxing back to [phone] Any questions, call [phone]

## 2018-08-05 NOTE — Progress Notes (Signed)
Emgality approved.

## 2018-08-05 NOTE — Progress Notes (Signed)
Rcvd fax approval from [employer] approved 07/31/18 - 07/31/19

## 2018-08-12 ENCOUNTER — Other Ambulatory Visit: Payer: Self-pay

## 2018-08-19 NOTE — Progress Notes (Signed)
Virtual Visit via Video Note The purpose of this virtual visit is to provide medical care while limiting exposure to the novel coronavirus.    Consent was obtained for video visit:  Yes Answered questions that patient had about telehealth interaction:  Yes I discussed the limitations, risks, security and privacy concerns of performing an evaluation and management service by telemedicine. I also discussed with the patient that there may be a patient responsible charge related to this service. The patient expressed understanding and agreed to proceed.  Pt location: Home Physician Location: Home Name of referring provider:  Mayra Neer, MD I connected with Jody Blackwell at patients initiation/request on 08/20/2018 at  8:30 AM EDT by video enabled telemedicine application and verified that I am speaking with the correct person using two identifiers. Pt MRN:  [id number] Pt DOB:  30-Dec-1965 Video Participants:  Jody Blackwell   History of Present Illness:  Jody Blackwell is a 53 year old woman with chronic pain syndrome, depression and GERD who follows up for migraine.  UPDATE: She had frequent intractable migraines in March and April, requiring prednisone taper or ED visits.  They were then moderate-to severe, lasting 60 minutes, occurring approximately once to twice a week but several for the week prior to next Emgality shot.  In total, she still averages about 15 headache days a month.  Naproxen mini-prophylaxis ineffective.  For the past week, she has been having a migraine off and on for the past week.  Not due for next shot until July 7.   Rescue: Zembrace and Cambia Frequency of abortive medication:15-16 days a month. Mini-prophylaxis:  Naproxen 500mg  twice daily beginning one week prior to Emgality injection and continued for 14 days. Current NSAIDS:Cambia, naproxen Current analgesics:None Current triptans:Zembrace SymTouch Current ergotamine:none Current  anti-emetic:Zofran 8 mg Current muscle relaxants:Baclofen Current anti-anxiolytic:None Current sleep aide:None Current Antihypertensive medications:None Current Antidepressant medications:Lexapro Current Anticonvulsant medications:[name]200 mg at bedtime Current anti-CGRP:Emgality Current Vitamins/Herbal/Supplements:Magnesium 400 mg, D Current Antihistamines/Decongestants:None Other therapy:None Hormone:none  Caffeine:2 cups coffee daily Diet:Hydrates. No soda. Exercise:Gym 3 days a week Depression:No; Anxiety:No. History of depression. Other pain:No Sleep hygiene:Good except for hot flashes.  HISTORY: Onset:  Her early 33s Location: right occipital Quality: pounding, like head is going to explode Initial Intensity: Severe prior to [place]. Now moderate and sometimes severe. Aura: Severe headache preceded by left tunnel vision for 15-20 minutes Prodrome: Generalized aches, tired Postdrome: Tired, foggy, washed out Associated symptoms:  Nausea, photophobia, phonophobia, osmophobia, blurred vision. Left facial numbness when severe. No unilateral weakness. Initial Duration: Prior to [name], 2 days up to 2 weeks. Now 4 hours with sumatriptan 50mg . Initial Frequency: Prior to [name], 20+ days per month. Now 10 days per month (worse the week prior to next injection). Initial Frequency of abortive medication: as needed Triggers:  Artificial sweeteners, tea, raw onions, perfumes/lotions, skipped meals, decreased sleep, change in weather Relieving factors:  Rest Activity: Aggravates. Cannot function about 2 to 3 days a month.  Past NSAIDS: ibuprofen, naproxen, diclofenac 50mg  Past analgesics: Tylenol Past abortive triptans: Sumatriptan 50 mg (ineffective), Sumatriptan patch (effective),[name]/[name] (rapid heartbeat, flushing), [name] 5mg  NS (ineffective) Past ergotamine:  Migranal (nose bleeds) Past muscle relaxants: no Past  anti-emetic: no Past antihypertensive medications: propranolol (initially effective for many years), verapamil Past antidepressant medications: Nortriptyline, amitriptyline, escitalopram Past anticonvulsant medications: gabapentin Past anti--CGRP: [name] Past vitamins/Herbal/Supplements: no Past antihistamines/decongestants: no Other past therapies: Trigger point injections/nerve blocks  Family history of headache: Mother, maternal grandmother  03/22/15: Sed Rate 3  Past Medical History: Past Medical History:  Diagnosis Date  . Arthritis    OA in hands, hip and feet  . Chronic sinusitis   . Complication of anesthesia   . Depression   . GERD (gastroesophageal reflux disease)   . Joint pain   . Migraine   . PONV (postoperative nausea and vomiting)     Medications: Outpatient Encounter Medications as of 08/20/2018  Medication Sig  . albuterol ([name];[name]) 108 (90 Base) MCG/ACT inhaler   . baclofen ([name]) 10 MG tablet Take 1 tablet (10 mg total) by mouth 3 (three) times daily as needed for muscle spasms.  . benzonatate (TESSALON) 100 MG capsule Take 1-2 capsules (100-200 mg total) by mouth 3 (three) times daily as needed for cough.  . Cholecalciferol ([name]) 2000 units CAPS Take 1 capsule by mouth daily.  . diclofenac (VOLTAREN) 50 MG EC tablet Take 1 tablet (50 mg total) by mouth as needed.  . Diclofenac Potassium (CAMBIA) 50 MG PACK Take 50 mg by mouth as directed.  . Diclofenac Sodium 3 % GEL Place 1 Dose onto the skin as needed.  [name] Kitchen escitalopram (LEXAPRO) 10 MG tablet   . esomeprazole ([name]) 40 MG capsule Take 40 mg by mouth daily at 12 noon.  . Galcanezumab-gnlm (EMGALITY) 120 MG/ML SOSY Inject 120 mg into the skin every 30 (thirty) days.  . Galcanezumab-gnlm (EMGALITY) 120 MG/ML SOSY Inject 120 mg into the skin every 30 (thirty) days.  . magnesium oxide ([name]) 400 MG tablet Take 400 mg by mouth daily.  . naproxen (NAPROSYN) 500 MG tablet  Twice daily beginning 1 week prior to Emgality injection for 14 days  . ondansetron (ZOFRAN) 8 MG tablet Take 8 mg by mouth as needed for nausea or vomiting.  . predniSONE (STERAPRED UNI-PAK 21 TAB) 10 MG (21) TBPK tablet As directed  . ranitidine ([name]) 150 MG capsule Take 150 mg by mouth daily.   . SUMAtriptan Succinate (ZEMBRACE SYMTOUCH) 3 MG/0.5ML SOAJ Inject 3 mg into the skin as directed. May repeat in 1 hr if needed, no more than 2 in 24 hrs  . SUMAtriptan Succinate (ZEMBRACE SYMTOUCH) 3 MG/0.5ML SOAJ Inject 3 mg into the skin as directed. Inject 3 mg into the skin as directed. May repeat in 1 hr if needed, no more than 2 in 24 hrs  . topiramate ER ([name]) 200 MG CS24 sprinkle capsule Take 1 capsule (200 mg total) by mouth at bedtime.  . topiramate ER ([name]) CS24 sprinkle capsule Take 1 capsule (100 mg total) by mouth daily. (Patient taking differently: Take 200 mg by mouth daily. )  . triamcinolone cream (KENALOG) 0.1 % Apply 1 application topically as needed.   No facility-administered encounter medications on file as of 08/20/2018.     Allergies: Allergies  Allergen Reactions  . Aspirin Hives  . Ibuprofen Hives  . 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 Sulfa Antibiotics Hives  . Triptans Other (See Comments)    Sensitivity to injectable triptan drugs per pt  . Zithromax [Azithromycin Dihydrate] Hives  . Dilaudid [Hydromorphone Hcl] Palpitations    Family History: Family History  Problem Relation Age of Onset  . Diabetes Mother   . Diabetes Father   . Heart attack Father   . Hypertension Father   . Diabetes Maternal Grandfather   . Heart attack Maternal Grandfather   . Hypertension Maternal Grandfather   . Diabetes Paternal Grandfather   .  Heart attack Paternal Grandfather   . Hypertension Paternal Grandfather   . High blood pressure Brother     Social History: Social History   Socioeconomic History  . Marital status:  Married    Spouse name: Clair Gulling  . Number of children: 3  . Years of education: Not on file  . Highest education level: Master's degree (e.g., MA, MS, MEng, MEd, MSW, MBA)  Occupational History  . Occupation: Herbalist: [employer]  . Financial resource strain: Not on file  . Food insecurity    Worry: Not on file    Inability: Not on file  . Transportation needs    Medical: Not on file    Non-medical: Not on file  Tobacco Use  . Smoking status: Former Smoker    Packs/day: 0.25    Years: 5.00    Pack years: 1.25    Types: Cigarettes    Quit date: 05/30/1989    Years since quitting: 29.2  . Smokeless tobacco: Never Used  . Tobacco comment: social smoker per pt  Substance and Sexual Activity  . Alcohol use: Yes    Comment: occasionally  . Drug use: No  . Sexual activity: Not on file  Lifestyle  . Physical activity    Days per week: Not on file    Minutes per session: Not on file  . Stress: Not on file  Relationships  . Social Herbalist on phone: Not on file    Gets together: Not on file    Attends religious service: Not on file    Active member of club or organization: Not on file    Attends meetings of clubs or organizations: Not on file    Relationship status: Not on file  . Intimate partner violence    Fear of current or ex partner: Not on file    Emotionally abused: Not on file    Physically abused: Not on file    Forced sexual activity: Not on file  Other Topics Concern  . Not on file  Social History Narrative   Married, lives with husband. Drinks 2 cups of coffee a day, an occasional soda. Exercises 3 X a week.   Observations/Objective:   Vitals:   No acute distress.  Alert and oriented.  Speech fluent and not dysarthric.  Language intact.  Face symmetric.     Assessment and Plan:   Chronic migraine without status migrainosus, not intractable.  She has had at least 15 headache days a month for at least 3 months.   She has failed multiple preventatives (Topiramate, nortriptyline, propranolol, [name], Emgality).  I think she would benefit from Botox  1. Prednisone taper for current daily migraine  2. For preventative management, stop Emgality.  We will submit for preauthorization to start Botox. 3.  For abortive therapy, Zembrace with Cambia (not to take Cambia while on prednisone) 4.  Limit use of pain relievers to no more than 2 days out of week to prevent risk of rebound or medication-overuse headache. 5.  Keep headache diary 6.  Exercise, hydration, caffeine cessation, sleep hygiene, monitor for and avoid triggers 7.  Consider:  magnesium citrate 400mg  daily, riboflavin 400mg  daily, and coenzyme Q10 100mg  three times daily 8. Always keep in mind that currently taking a hormone or birth control may be a possible trigger or aggravating factor for migraine. 9. Follow up for first round of Botox  Follow Up Instructions:    -  I discussed the assessment and treatment plan with the patient. The patient was provided an opportunity to ask questions and all were answered. The patient agreed with the plan and demonstrated an understanding of the instructions.   The patient was advised to call back or seek an in-person evaluation if the symptoms worsen or if the condition fails to improve as anticipated.   Dudley Major, DO

## 2018-08-20 ENCOUNTER — Encounter: Payer: Self-pay | Admitting: Neurology

## 2018-08-20 ENCOUNTER — Encounter: Payer: Self-pay | Admitting: *Deleted

## 2018-08-20 ENCOUNTER — Telehealth (INDEPENDENT_AMBULATORY_CARE_PROVIDER_SITE_OTHER): Payer: 59 | Admitting: Neurology

## 2018-08-20 ENCOUNTER — Other Ambulatory Visit: Payer: Self-pay

## 2018-08-20 VITALS — Ht 65.5 in | Wt 210.0 lb

## 2018-08-20 DIAGNOSIS — G43709 Chronic migraine without aura, not intractable, without status migrainosus: Secondary | ICD-10-CM | POA: Diagnosis not present

## 2018-08-20 MED ORDER — PREDNISONE 10 MG PO TABS: ORAL_TABLET | ORAL | 0 refills | Status: DC

## 2018-08-20 NOTE — Addendum Note (Signed)
Addended by: Tomi Likens, ADAM R on: 08/20/2018 08:42 AM   Modules accepted: Orders

## 2018-08-20 NOTE — Progress Notes (Signed)
Jody Blackwell (Key: [id number]) - SRR  Need help? Call us at [phone]     Status  Sent to [employer]  Next Steps  The plan will fax you a determination, typically within 1 to 5 business days. How do I follow up?  DrugBotox 200UNIT solution  FormAetna Specialty Botox Injectable Precertification FormPrecertification for Botox [phone]

## 2018-08-21 NOTE — Progress Notes (Addendum)
Rcvd fax from [place] reference # [id number]  Botox approved 09/18/2018 - 03/17/2019  If service dates change, call [phone]  [facility], spoke with Gerald Stabs to determine shipping for Botox. Gerald Stabs stated this PA was for buy and bill. I asked was there coverage to have through Pt's specialty pharmacy benefit, he stated there was.  Gerald Stabs has changed the PA to be shipped through specialty pharmacy, transferred me to [facility], who advised me effective 07/27/18, will need to call [facility] at [phone]. Called and spoke with [place]. She states that the switch from [place] to [facility], everything has not been switched over and she is unable to find the Pt. I will need to call Aetna.

## 2018-09-04 NOTE — Progress Notes (Signed)
[employer] provider services, spoke with [place]. I was advised to call: [phone]. Called and spoke again to [place]. She said I would have to call [facility] again, [phone].

## 2018-10-01 ENCOUNTER — Other Ambulatory Visit: Payer: Self-pay | Admitting: Physician Assistant

## 2018-10-01 DIAGNOSIS — Z1231 Encounter for screening mammogram for malignant neoplasm of breast: Secondary | ICD-10-CM

## 2018-10-13 ENCOUNTER — Other Ambulatory Visit: Payer: Self-pay

## 2018-10-13 ENCOUNTER — Ambulatory Visit
Admission: RE | Admit: 2018-10-13 | Discharge: 2018-10-13 | Disposition: A | Discharge status: Home / Self Care | Payer: 59 | Source: Ambulatory Visit | Attending: Physician Assistant | Admitting: Physician Assistant

## 2018-10-13 DIAGNOSIS — Z1231 Encounter for screening mammogram for malignant neoplasm of breast: Secondary | ICD-10-CM

## 2018-10-17 ENCOUNTER — Other Ambulatory Visit: Payer: Self-pay | Admitting: Neurology

## 2018-10-17 DIAGNOSIS — G43109 Migraine with aura, not intractable, without status migrainosus: Secondary | ICD-10-CM

## 2018-10-21 ENCOUNTER — Telehealth: Payer: Self-pay | Admitting: Neurology

## 2018-10-21 NOTE — Telephone Encounter (Signed)
Patient needs to know what the out of pocket cost is for her Botox and the copay amount before she comes

## 2018-10-21 NOTE — Telephone Encounter (Signed)
[employer] is calling about a PA for the [employer] injector medication. Please call them back. Thanks!

## 2018-10-23 ENCOUNTER — Ambulatory Visit: Payer: 59 | Admitting: Neurology

## 2018-11-09 ENCOUNTER — Telehealth: Payer: Self-pay | Admitting: Neurology

## 2018-11-09 ENCOUNTER — Other Ambulatory Visit: Payer: Self-pay | Admitting: Neurology

## 2018-11-09 MED ORDER — EMGALITY 120 MG/ML ~~LOC~~ SOAJ: 120.0000 mg | SUBCUTANEOUS | 5 refills | Status: DC

## 2018-11-09 NOTE — Telephone Encounter (Signed)
Patient cancelled Botox appt for this Friday. She said she has been doing better and would like to hold off on Botox for now. She was wanting to stay with the [facility] and do just the medication for now. Just FYI. Thanks!

## 2018-11-10 NOTE — Telephone Encounter (Signed)
For your review

## 2018-11-13 ENCOUNTER — Ambulatory Visit: Payer: 59 | Admitting: Neurology

## 2018-11-16 ENCOUNTER — Encounter: Payer: Self-pay | Admitting: *Deleted

## 2018-11-16 NOTE — Progress Notes (Addendum)
Jody Blackwell (Key: [id number]) [name] 200MG  er sprinkle capsules   Form [employer] [place] [phone] phone [phone] fax Created 6 days ago Sent to Plan 2 days ago Determination Unknown 20 minutes ago Message from Plan PA not needed

## 2018-11-19 ENCOUNTER — Other Ambulatory Visit: Payer: Self-pay | Admitting: Neurology

## 2018-11-19 MED ORDER — TOPIRAMATE ER 200 MG PO SPRINKLE CAP24: 200.0000 mg | EXTENDED_RELEASE_CAPSULE | Freq: Every day | ORAL | 11 refills | Status: DC

## 2018-11-23 NOTE — Progress Notes (Signed)
Received fax from [facility] and PA is NOT required for [name] 220/24 HR CAP at this time. This documentation was sent to scanner/medical records. MD has sent in script for patient.

## 2018-12-14 MED ORDER — NURTEC 75 MG PO TBDP: 1.0000 | ORAL_TABLET | Freq: Every day | ORAL | 11 refills | Status: DC | PRN

## 2018-12-15 ENCOUNTER — Other Ambulatory Visit: Payer: Self-pay

## 2018-12-15 DIAGNOSIS — Z20822 Contact with and (suspected) exposure to covid-19: Secondary | ICD-10-CM

## 2018-12-17 ENCOUNTER — Ambulatory Visit
Admission: RE | Admit: 2018-12-17 | Discharge: 2018-12-17 | Disposition: A | Discharge status: Home / Self Care | Payer: 59 | Source: Ambulatory Visit | Attending: Physician Assistant | Admitting: Physician Assistant

## 2018-12-17 ENCOUNTER — Other Ambulatory Visit: Payer: Self-pay | Admitting: Physician Assistant

## 2018-12-17 DIAGNOSIS — R0602 Shortness of breath: Secondary | ICD-10-CM

## 2018-12-17 LAB — NOVEL CORONAVIRUS, NAA: SARS-CoV-2, NAA: NOT DETECTED

## 2018-12-29 NOTE — Progress Notes (Signed)
Received fax from [facility] 75 MG  Approved effective 12/29/18 to 12/29/2019  PA# Aetna Value Plus- SI- PPO [id number] SS     PA submitted via covermymeds

## 2019-01-15 NOTE — Progress Notes (Deleted)
Office Visit Note  Patient: Jody Blackwell             Date of Birth: 1965-03-22           MRN: [id number]             PCP: Mayra Neer, MD Referring: Lennie Odor, PA-C Visit Date: 01/28/2019 Occupation: @GUAROCC @  Subjective:  No chief complaint on file.   History of Present Illness: Jody Blackwell is a 52 y.o. female ***   Activities of Daily Living:  Patient reports morning stiffness for *** {minute/hour:19697}.   Patient {ACTIONS;DENIES/REPORTS:21021675::"Denies"} nocturnal pain.  Difficulty dressing/grooming: {ACTIONS;DENIES/REPORTS:21021675::"Denies"} Difficulty climbing stairs: {ACTIONS;DENIES/REPORTS:21021675::"Denies"} Difficulty getting out of chair: {ACTIONS;DENIES/REPORTS:21021675::"Denies"} Difficulty using hands for taps, buttons, cutlery, and/or writing: {ACTIONS;DENIES/REPORTS:21021675::"Denies"}  No Rheumatology ROS completed.   PMFS History:  Patient Active Problem List   Diagnosis Date Noted   Peroneal tendon tear 07/20/2015   Left ankle pain 08/03/2010   Left shoulder pain 06/29/2010    Past Medical History:  Diagnosis Date   Arthritis    OA in hands, hip and feet   Chronic sinusitis    Complication of anesthesia    Depression    GERD (gastroesophageal reflux disease)    Joint pain    Migraine    PONV (postoperative nausea and vomiting)     Family History  Problem Relation Age of Onset   Diabetes Mother    Diabetes Father    Heart attack Father    Hypertension Father    Diabetes Maternal Grandfather    Heart attack Maternal Grandfather    Hypertension Maternal Grandfather    Diabetes Paternal Grandfather    Heart attack Paternal Grandfather    Hypertension Paternal Grandfather    High blood pressure Brother    Past Surgical History:  Procedure Laterality Date   ABDOMINAL HYSTERECTOMY     ANKLE RECONSTRUCTION Right 07/20/2015   Procedure: OPEN EXPLORATION OF LATERAL RIGHT PERONEUS BREVIS TENDON  WITH REPAIR ;  Surgeon: Garald Balding, MD;  Location: [place];  Service: Orthopedics;  Laterality: Right;   CESAREAN SECTION     x 2   CHOLECYSTECTOMY  2012   lap choli   MASS EXCISION  06/03/2011   Procedure: EXCISION MASS;  Surgeon: Schuyler Amor, MD;  Location: [place];  Service: Orthopedics;  Laterality: Right;  EXCISION MASS RIGHT MIDDLE FINGER    NASAL SINUS SURGERY  2007, 2010   Social History   Social History Narrative   Married, lives with husband. Drinks 2 cups of coffee a day, an occasional soda. Exercises 3 X a week.   Immunization History  Administered Date(s) Administered   Influenza Inj Mdck Quad Pf 12/02/2017   Influenza Split 11/21/2015   Influenza-Unspecified 11/19/2016     Objective: Vital Signs: There were no vitals taken for this visit.   Physical Exam   Musculoskeletal Exam: ***  CDAI Exam: CDAI Score: -- Patient Global: --; Provider Global: -- Swollen: --; Tender: -- Joint Exam   No joint exam has been documented for this visit   There is currently no information documented on the homunculus. Go to the Rheumatology activity and complete the homunculus joint exam.  Investigation: No additional findings.  Imaging: Dg Chest 2 View  Result Date: 12/17/2018 CLINICAL DATA:  53 year old female with history of shortness of breath. Recent negative test for COVID. EXAM: CHEST - 2 VIEW COMPARISON:  Chest x-ray 07/11/2016. FINDINGS: Patchy ill-defined opacities asymmetrically distributed throughout the lungs  bilaterally, concerning for multilobar pneumonia. No pleural effusions. No pneumothorax. No suspicious appearing pulmonary nodules or masses are noted. No evidence of pulmonary edema. Heart size is normal. Upper mediastinal contours are within normal limits. IMPRESSION: 1. Patchy multifocal asymmetrically distributed ill-defined opacities in the lungs bilaterally concerning for multilobar pneumonia.  Electronically Signed   By: Vinnie Langton M.D.   On: 12/17/2018 18:23    Recent Labs: Lab Results  Component Value Date   WBC 7.6 07/11/2016   HGB 12.8 07/11/2016   PLT 289 07/11/2016   NA 139 09/19/2016   K 3.8 09/19/2016   CL 109 09/19/2016   CO2 24 09/19/2016   GLUCOSE 111 (H) 09/19/2016   BUN 15 09/19/2016   CREATININE 0.98 09/19/2016   BILITOT 0.4 09/19/2016   ALKPHOS 43 09/19/2016   AST 11 09/19/2016   ALT 12 09/19/2016   PROT 6.6 09/19/2016   ALBUMIN 3.9 09/19/2016   CALCIUM 9.0 09/19/2016   GFRAA >60 07/11/2016    Speciality Comments: No specialty comments available.  Procedures:  No procedures performed Allergies: Aspirin, Ibuprofen, Metoclopramide hcl, Sulfa antibiotics, Triptans, Zithromax [azithromycin dihydrate], and Dilaudid [hydromorphone hcl]   Assessment / Plan:     Visit Diagnoses: No diagnosis found.  Orders: No orders of the defined types were placed in this encounter.  No orders of the defined types were placed in this encounter.   Face-to-face time spent with patient was *** minutes. Greater than 50% of time was spent in counseling and coordination of care.  Follow-Up Instructions: No follow-ups on file.   Ofilia Neas, PA-C  Note - This record has been created using Dragon software.  Chart creation errors have been sought, but may not always  have been located. Such creation errors do not reflect on  the standard of medical care.

## 2019-01-20 ENCOUNTER — Other Ambulatory Visit: Payer: Self-pay | Admitting: Physician Assistant

## 2019-01-20 ENCOUNTER — Ambulatory Visit
Admission: RE | Admit: 2019-01-20 | Discharge: 2019-01-20 | Disposition: A | Discharge status: Home / Self Care | Payer: 59 | Source: Ambulatory Visit | Attending: Physician Assistant | Admitting: Physician Assistant

## 2019-01-20 DIAGNOSIS — R05 Cough: Secondary | ICD-10-CM

## 2019-01-20 DIAGNOSIS — R059 Cough, unspecified: Secondary | ICD-10-CM

## 2019-01-21 ENCOUNTER — Other Ambulatory Visit: Payer: Self-pay | Admitting: Neurology

## 2019-01-26 ENCOUNTER — Telehealth: Payer: Self-pay | Admitting: Rheumatology

## 2019-01-26 NOTE — Telephone Encounter (Signed)
FYI: We canceled patient's NPT APPT for Thursday until we can see her in the office. Patient is a former patient. Patient wanted to let you know her referring doctor put her on Cymbalta to help with possible FMS, and patient is doing well on that. She does want to reschedule appt as soon as we are seeing patient's again in office.

## 2019-01-27 NOTE — Telephone Encounter (Signed)
If patient is okay to have new patient virtual visit I am okay to schedule it please explained to her that I would not be able to examine her.

## 2019-01-27 NOTE — Telephone Encounter (Signed)
Patient okay with waiting until an in person appt can be scheduled.

## 2019-01-28 ENCOUNTER — Ambulatory Visit: Payer: 59 | Admitting: Rheumatology

## 2019-02-16 ENCOUNTER — Other Ambulatory Visit: Payer: Self-pay | Admitting: Neurology

## 2019-02-16 MED ORDER — PREDNISONE 10 MG PO TABS: ORAL_TABLET | ORAL | 0 refills | Status: DC

## 2019-03-04 ENCOUNTER — Ambulatory Visit: Payer: 59 | Admitting: Rheumatology

## 2019-03-07 DIAGNOSIS — Z03818 Encounter for observation for suspected exposure to other biological agents ruled out: Secondary | ICD-10-CM | POA: Diagnosis not present

## 2019-03-13 ENCOUNTER — Other Ambulatory Visit: Payer: Self-pay | Admitting: Neurology

## 2019-03-15 ENCOUNTER — Telehealth: Payer: Self-pay

## 2019-03-15 ENCOUNTER — Other Ambulatory Visit: Payer: Self-pay

## 2019-03-15 MED ORDER — EMGALITY 120 MG/ML ~~LOC~~ SOAJ: 120.0000 mg | SUBCUTANEOUS | 5 refills | Status: DC

## 2019-03-15 NOTE — Telephone Encounter (Signed)
I have new insurance effective 02/26/2019--BCBS which I entered through [place]; member ID is [id number], Group number is [id number]. Emgality must be ordered through their specialty pharmacy, [place]. Please fax prescription to [phone] as soon as possible. I am a week late on my shot. Thanks, Hinton Dyer

## 2019-03-17 ENCOUNTER — Telehealth: Payer: Self-pay | Admitting: Neurology

## 2019-03-17 ENCOUNTER — Other Ambulatory Visit: Payer: Self-pay

## 2019-03-17 NOTE — Telephone Encounter (Signed)
Patient called and said a PA from [facility] should be faxed to the office for completion so she can get her medication.

## 2019-03-17 NOTE — Telephone Encounter (Signed)
Patient called in regarding her having a migraine now for 2 days and she has been 1 week late on her  Emgality shot. She's asking if we have a ny samples here in the office? Her daughter Maretta Bees is in [place] and could come by to get them. Please Call. Thank you

## 2019-03-17 NOTE — Progress Notes (Signed)
Samples of Emgality drug were given to the patient, quantity 2 box , Lot Number [id number] AA exp 09/2019

## 2019-03-19 ENCOUNTER — Encounter: Payer: Self-pay | Admitting: *Deleted

## 2019-03-19 ENCOUNTER — Other Ambulatory Visit: Payer: Self-pay

## 2019-03-19 MED ORDER — TOPIRAMATE ER 200 MG PO SPRINKLE CAP24: 200.0000 mg | EXTENDED_RELEASE_CAPSULE | Freq: Every day | ORAL | 3 refills | Status: DC

## 2019-03-19 NOTE — Progress Notes (Signed)
Received fax response to PA medication request from [employer] Approved 03/19/2019 dosage frequency as prescribed Sent to scan into her chart

## 2019-04-05 ENCOUNTER — Emergency Department (HOSPITAL_COMMUNITY)
Admission: EM | Admit: 2019-04-05 | Discharge: 2019-04-05 | Disposition: A | Discharge status: Home / Self Care | Payer: BC Managed Care – PPO | Attending: Emergency Medicine | Admitting: Emergency Medicine

## 2019-04-05 ENCOUNTER — Encounter (HOSPITAL_COMMUNITY): Payer: Self-pay | Admitting: Emergency Medicine

## 2019-04-05 ENCOUNTER — Emergency Department (HOSPITAL_COMMUNITY): Payer: BC Managed Care – PPO

## 2019-04-05 ENCOUNTER — Other Ambulatory Visit: Payer: Self-pay

## 2019-04-05 DIAGNOSIS — Z7982 Long term (current) use of aspirin: Secondary | ICD-10-CM | POA: Insufficient documentation

## 2019-04-05 DIAGNOSIS — K219 Gastro-esophageal reflux disease without esophagitis: Secondary | ICD-10-CM | POA: Diagnosis not present

## 2019-04-05 DIAGNOSIS — R0602 Shortness of breath: Secondary | ICD-10-CM | POA: Diagnosis not present

## 2019-04-05 DIAGNOSIS — R079 Chest pain, unspecified: Secondary | ICD-10-CM

## 2019-04-05 DIAGNOSIS — R002 Palpitations: Secondary | ICD-10-CM | POA: Insufficient documentation

## 2019-04-05 DIAGNOSIS — Z87891 Personal history of nicotine dependence: Secondary | ICD-10-CM | POA: Insufficient documentation

## 2019-04-05 DIAGNOSIS — R000 Tachycardia, unspecified: Secondary | ICD-10-CM | POA: Diagnosis not present

## 2019-04-05 DIAGNOSIS — R0789 Other chest pain: Secondary | ICD-10-CM | POA: Insufficient documentation

## 2019-04-05 LAB — CBC WITH DIFFERENTIAL/PLATELET
Abs Immature Granulocytes: 0.02 10*3/uL (ref 0.00–0.07)
Basophils Absolute: 0.1 10*3/uL (ref 0.0–0.1)
Basophils Relative: 1 %
Eosinophils Absolute: 0.1 10*3/uL (ref 0.0–0.5)
Eosinophils Relative: 2 %
HCT: 43.4 % (ref 36.0–46.0)
Hemoglobin: 13.8 g/dL (ref 12.0–15.0)
Immature Granulocytes: 0 %
Lymphocytes Relative: 24 %
Lymphs Abs: 1.5 10*3/uL (ref 0.7–4.0)
MCH: 30.5 pg (ref 26.0–34.0)
MCHC: 31.8 g/dL (ref 30.0–36.0)
MCV: 96 fL (ref 80.0–100.0)
Monocytes Absolute: 0.4 10*3/uL (ref 0.1–1.0)
Monocytes Relative: 7 %
Neutro Abs: 4.1 10*3/uL (ref 1.7–7.7)
Neutrophils Relative %: 66 %
Platelets: 274 10*3/uL (ref 150–400)
RBC: 4.52 MIL/uL (ref 3.87–5.11)
RDW: 13.4 % (ref 11.5–15.5)
WBC: 6.2 10*3/uL (ref 4.0–10.5)
nRBC: 0 % (ref 0.0–0.2)

## 2019-04-05 LAB — BASIC METABOLIC PANEL
Anion gap: 8 (ref 5–15)
BUN: 18 mg/dL (ref 6–20)
CO2: 22 mmol/L (ref 22–32)
Calcium: 9.3 mg/dL (ref 8.9–10.3)
Chloride: 111 mmol/L (ref 98–111)
Creatinine, Ser: 1.08 mg/dL — ABNORMAL HIGH (ref 0.44–1.00)
GFR calc Af Amer: >60 mL/min (ref >60)
GFR calc non Af Amer: 58 mL/min — ABNORMAL LOW (ref >60)
Glucose, Bld: 114 mg/dL — ABNORMAL HIGH (ref 70–99)
Potassium: 3.9 mmol/L (ref 3.5–5.1)
Sodium: 141 mmol/L (ref 135–145)

## 2019-04-05 LAB — TROPONIN I (HIGH SENSITIVITY)
Troponin I (High Sensitivity): 3 ng/L (ref <18)
Troponin I (High Sensitivity): 4 ng/L (ref <18)

## 2019-04-05 MED ORDER — ACETAMINOPHEN 325 MG PO TABS
650.0000 mg | ORAL_TABLET | Freq: Once | ORAL | Status: AC
  Administered 2019-04-05: 650 mg via ORAL
  Filled 2019-04-05: qty 2

## 2019-04-05 NOTE — ED Notes (Signed)
Pt ambulated in the hallway with sats 97-100%. HR low 100s. No distress noted. PIV removed. Pt getting dressed for discharge home. Return precautions given.

## 2019-04-05 NOTE — ED Triage Notes (Signed)
Pt reports having virtual appt with PCP today. Sent here for CP and SOB. Reports her HR has been up and down for a months.

## 2019-04-05 NOTE — ED Provider Notes (Signed)
[place] EMERGENCY DEPARTMENT Provider Note   CSN: [id number] Arrival date & time: 04/05/19  [id number]     History Chief Complaint  Patient presents with  . Chest Pain    Jody Blackwell is a 54 y.o. female with a past medical history significant for depression, migraine headaches, and GERD who presents to the ED due to palpitations and chest pain.  Patient had a virtual visit with her PCP today who advised her to go to the ER to be evaluated for her chest pain.  Patient notes she has been having intermittent, left-sided, nonradiating chest pressure and achy sensation for the past few days.  Patient notes she is also been experiencing palpitations for the past few months.  Patient admits to checking her heart rate at home which has been running in the 130s to 140s.  Patient notes palpitations and chest pressure is thusly related to exertion.  She admits to feeling her heart skip a beat. She notes she has experienced chest pain and palpitations in the past. Denies alcohol, drug, and tobacco usage. She admits to having an early family history of CAD. Her father had an MI at age 32. Denies history of hypertension, diabetes, and hyperlipidemia. Denies associative nausea, vomiting, and diaphoresis. She does admit to intermittent shortness of breath with chest pain and palpitations. Patient notes she has been experiencing intermittent cough and sinus drainage for the past few says. She admits to having COVID in late October. Denies fever and chills.  Patient denies lower extremity edema, recent surgeries, recent long immobilizations, and history of blood clots.     Past Medical History:  Diagnosis Date  . Arthritis    OA in hands, hip and feet  . Chronic sinusitis   . Complication of anesthesia   . Depression   . GERD (gastroesophageal reflux disease)   . Joint pain   . Migraine   . PONV (postoperative nausea and vomiting)     Patient Active Problem List   Diagnosis Date  Noted  . Peroneal tendon tear 07/20/2015  . Left ankle pain 08/03/2010  . Left shoulder pain 06/29/2010    Past Surgical History:  Procedure Laterality Date  . ABDOMINAL HYSTERECTOMY    . ANKLE RECONSTRUCTION Right 07/20/2015   Procedure: OPEN EXPLORATION OF LATERAL RIGHT PERONEUS BREVIS TENDON WITH REPAIR ;  Surgeon: Garald Balding, MD;  Location: [place];  Service: Orthopedics;  Laterality: Right;  . CESAREAN SECTION     x 2  . CHOLECYSTECTOMY  2012   lap choli  . MASS EXCISION  06/03/2011   Procedure: EXCISION MASS;  Surgeon: Schuyler Amor, MD;  Location: [place];  Service: Orthopedics;  Laterality: Right;  EXCISION MASS RIGHT MIDDLE FINGER   . NASAL SINUS SURGERY  2007, 2010     OB History   No obstetric history on file.     Family History  Problem Relation Age of Onset  . Diabetes Mother   . Diabetes Father   . Heart attack Father   . Hypertension Father   . Diabetes Maternal Grandfather   . Heart attack Maternal Grandfather   . Hypertension Maternal Grandfather   . Diabetes Paternal Grandfather   . Heart attack Paternal Grandfather   . Hypertension Paternal Grandfather   . High blood pressure Brother     Social History   Tobacco Use  . Smoking status: Former Smoker    Packs/day: 0.25    Years: 5.00  Pack years: 1.25    Types: Cigarettes    Quit date: 05/30/1989    Years since quitting: 29.8  . Smokeless tobacco: Never Used  . Tobacco comment: social smoker per pt  Substance Use Topics  . Alcohol use: Yes    Comment: occasionally  . Drug use: No    Home Medications Prior to Admission medications   Medication Sig Start Date End Date Taking? Authorizing Provider  albuterol ([name];[name]) 108 (90 Base) MCG/ACT inhaler    Yes [provider]  baclofen ([name]) 10 MG tablet Take 1 tablet (10 mg total) by mouth 3 (three) times daily as needed for muscle spasms. 04/14/18  Yes Jaffe, Adam R,  DO  Cholecalciferol ([name]) 2000 units CAPS Take 1 capsule by mouth daily.   Yes [provider]  co-enzyme Q-10 50 MG capsule Take 100 mg by mouth daily.   Yes [provider]  diclofenac (VOLTAREN) 50 MG EC tablet TAKE 1 TABLET BY MOUTH EVERY DAY AS NEEDED Patient taking differently: Take 50 mg by mouth daily as needed for moderate pain.  03/15/19  Yes Jaffe, Adam R, DO  DULoxetine (CYMBALTA) 60 MG capsule Take 60 mg by mouth daily. 03/17/19  Yes [provider]  escitalopram (LEXAPRO) 10 MG tablet  01/10/18  Yes [provider]  esomeprazole ([name]) 40 MG capsule Take 40 mg by mouth daily at 12 noon.   Yes [provider]  famotidine (PEPCID) 40 MG tablet Take 40 mg by mouth daily. 03/23/19  Yes [provider]  Galcanezumab-gnlm (EMGALITY) 120 MG/ML SOAJ Inject 120 mg into the skin every 30 (thirty) days. 03/15/19  Yes Jaffe, Adam R, DO  magnesium oxide ([name]) 400 MG tablet Take 400 mg by mouth daily.   Yes [provider]  ondansetron (ZOFRAN) 8 MG tablet Take 8 mg by mouth as needed for nausea or vomiting.   Yes [provider]  OSPHENA 60 MG TABS Take 1 tablet by mouth 3 (three) times a week. 03/22/19  Yes [provider]  PARoxetine Mesylate 7.5 MG CAPS Take 1 capsule by mouth at bedtime. 03/31/19  Yes [provider]  Rimegepant Sulfate (NURTEC) 75 MG TBDP Take 1 tablet by mouth daily as needed. 12/14/18  Yes Jaffe, Adam R, DO  topiramate ER ([name]) 200 MG CS24 sprinkle capsule Take 1 capsule (200 mg total) by mouth at bedtime. 03/19/19  Yes Jaffe, Adam R, DO  triamcinolone cream (KENALOG) 0.1 % Apply 1 application topically as needed.   Yes [provider]  Diclofenac Potassium (CAMBIA) 50 MG PACK Take 50 mg by mouth as directed. Patient not taking: Reported on 04/05/2019 06/02/18   Pieter Partridge, DO  Diclofenac Sodium 3 % GEL Place 1 Dose onto the skin as needed. Patient not taking:  Reported on 04/05/2019 02/16/18   Pieter Partridge, DO  naproxen (NAPROSYN) 500 MG tablet Twice daily beginning 1 week prior to [facility] injection for 14 days Patient not taking: Reported on 04/05/2019 04/14/18   Pieter Partridge, DO  [place] 3 MG/0.5ML SOAJ INJECT 3MG  INTO THE SKIN AS DIRECTED. MAY REPEAT IN 1 HOUR IF NEEDED, NO MORE THAN 2 IN 24 HOURS Patient not taking: Reported on 04/05/2019 10/19/18   Pieter Partridge, DO    Allergies    Aspirin, Ibuprofen, Metoclopramide hcl, Sulfa antibiotics, Triptans, Zithromax [azithromycin dihydrate], and Dilaudid [hydromorphone hcl]  Review of Systems   Review of Systems  Constitutional: Negative for chills  and fever.  HENT: Positive for postnasal drip.   Respiratory: Positive for cough and shortness of breath.   Cardiovascular: Positive for chest pain. Negative for leg swelling.  Gastrointestinal: Negative for abdominal pain, diarrhea, nausea and vomiting.  Genitourinary: Negative for dysuria.  All other systems reviewed and are negative.   Physical Exam Updated Vital Signs BP 103/84   Pulse 79   Temp 98.1 F (36.7 C) (Oral)   Resp 15   Ht 5' 5.5" (1.664 m)   Wt 95.3 kg   SpO2 99%   BMI 34.41 kg/m   Physical Exam Vitals and nursing note reviewed.  Constitutional:      General: She is not in acute distress.    Appearance: She is not ill-appearing.  HENT:     Head: Normocephalic.  Eyes:     Pupils: Pupils are equal, round, and reactive to light.  Cardiovascular:     Rate and Rhythm: Normal rate and regular rhythm.     Pulses: Normal pulses.     Heart sounds: Normal heart sounds. No murmur. No friction rub. No gallop.   Pulmonary:     Effort: Pulmonary effort is normal.     Breath sounds: Normal breath sounds.     Comments: Respirations equal and unlabored, patient able to speak in full sentences, lungs clear to auscultation bilaterally Chest:     Comments: No anterior chest wall tenderness Abdominal:     General: Abdomen is  flat. There is no distension.     Palpations: Abdomen is soft.     Tenderness: There is no abdominal tenderness. There is no guarding or rebound.  Musculoskeletal:     Cervical back: Neck supple.     Comments: Able to move all 4 extremities without difficulty.  No lower extremity edema.  Negative Homans sign bilaterally.  Skin:    General: Skin is warm and dry.  Neurological:     General: No focal deficit present.     Mental Status: She is alert.  Psychiatric:        Mood and Affect: Mood normal.        Behavior: Behavior normal.     ED Results / Procedures / Treatments   Labs (all labs ordered are listed, but only abnormal results are displayed) Labs Reviewed  BASIC METABOLIC PANEL - Abnormal; Notable for the following components:      Result Value   Glucose, Bld 114 (*)    Creatinine, Ser 1.08 (*)    GFR calc non Af Amer 58 (*)    All other components within normal limits  CBC WITH DIFFERENTIAL/PLATELET  TROPONIN I (HIGH SENSITIVITY)  TROPONIN I (HIGH SENSITIVITY)    EKG EKG Interpretation  Date/Time:  Monday April 05 2019 09:52:42 EST Ventricular Rate:  106 PR Interval:  136 QRS Duration: 74 QT Interval:  370 QTC Calculation: 491 R Axis:   39 Text Interpretation: Sinus tachycardia Nonspecific T wave abnormality Abnormal ECG Confirmed by Dene Gentry [phone]) on 04/05/2019 9:58:56 AM   Radiology DG Chest Portable 1 View  Result Date: 04/05/2019 CLINICAL DATA:  54 year old female with chest pain.  Palpitations. EXAM: PORTABLE CHEST 1 VIEW COMPARISON:  Chest radiographs 01/20/2019 and earlier. FINDINGS: Portable AP upright view at 1036 hours. Lower lung volumes. Mediastinal contours remain normal. Visualized tracheal air column is within normal limits. Allowing for portable technique the lungs are clear. No pneumothorax or pleural effusion. Stable cholecystectomy clips. Negative visible bowel gas pattern. No acute osseous abnormality identified. IMPRESSION: Lower  lung  volumes, otherwise negative portable chest. Electronically Signed   By: Genevie Ann M.D.   On: 04/05/2019 10:44    Procedures Procedures (including critical care time)  Medications Ordered in ED Medications  acetaminophen (TYLENOL) tablet 650 mg (650 mg Oral Given 04/05/19 1146)    ED Course  I have reviewed the triage vital signs and the nursing notes.  Pertinent labs & imaging results that were available during my care of the patient were reviewed by me and considered in my medical decision making (see chart for details).    MDM Rules/Calculators/A&P                     54 year old female presents to the ED due to left-sided, nonradiating, chest pressure and achy sensation for the past few days associated with palpitations over the past few months.  Patient was sent here by PCP for further evaluation.  Patient denies history of blood clots, recent surgeries, recent immobilizations, and lower extremity edema.  Upon arrival, patient tachycardic at 112, but otherwise normal vitals.  Patient is afebrile and not hypoxic.  Physical exam reassuring.  No anterior chest wall tenderness.  No lower extremity edema.  Negative Homans sign bilaterally.  Obtain routine labs, EKG, and chest x-ray.  EKG personally reviewed which demonstrates sinus tachycardia with nonspecific T wave abnormalities.  Chest x-ray personally reviewed which demonstrates low lung volumes, but no signs of pneumonia, pneumothorax, or widened mediastinum.  CBC completely unremarkable with no leukocytosis. BMP significant for AKI with creatinine of 1.08 likely due to dehydration, but otherwise reassuring. Initial troponin 3. Will obtain delta troponin.   2nd troponin normal. Doubt ACS. Physical exam and HPI unconcerning for PE. Dissection was considered to be less likely based of physical exam and HPI. Discussed case with Dr. Francia Greaves who agrees with assessment and plan. Suspect palpitation could be related to SVT vs. Anxiety component. Will  discharge patient with cardiology referral. Advised patient to call today to schedule an appointment. Strict ED precautions discussed with patient. Patient states understanding and agrees to plan. Patient discharged home in no acute distress and stable vitals. Final Clinical Impression(s) / ED Diagnoses Final diagnoses:  Palpitations  Nonspecific chest pain    Rx / DC Orders ED Discharge Orders    None       Karie Kirks 04/05/19 1412    Valarie Merino, MD 04/09/19 1356

## 2019-04-05 NOTE — Progress Notes (Signed)
Cardiology Office Note:    Date:  04/06/2019   ID:  Jody Blackwell, DOB 1965-12-02, MRN [id number]  PCP:  Jody Neer, MD  Cardiologist:  Jody More, MD   Referring MD: Jody Neer, MD  ASSESSMENT:    1. Palpitations   2. Hypotension, unspecified hypotension type   3. Costochondral chest pain    PLAN:    In order of problems listed above:  1. She has 2 cardiac problems.  The first is palpitation with tachycardia and relatively low blood pressure in the setting of COVID-19.  These individuals at times have autonomic dysfunction because of cardiac involvement and for further evaluation a 2-week monitor echocardiogram.  I have asked her with her thirst and low blood pressure to add salt to her diet expander blood volume if it mitigates her symptoms. 2. Typical costochondral pain syndrome reproducible on exam prescription strength nonsteroidal anti-inflammatory drug.  I do not think she needs an ischemia evaluation at this time  Next appointment 4 weeks   Medication Adjustments/Labs and Tests Ordered: Current medicines are reviewed at length with the patient today.  Concerns regarding medicines are outlined above.  Orders Placed This Encounter  Procedures  . LONG TERM MONITOR-LIVE TELEMETRY (3-14 DAYS)  . ECHOCARDIOGRAM COMPLETE   Meds ordered this encounter  Medications  . celecoxib (CELEBREX) 100 MG capsule    Sig: Take 1 capsule (100 mg total) by mouth 2 (two) times daily.    Dispense:  30 capsule    Refill:  0     No chief complaint on file.   History of Present Illness:    Jody Blackwell is a 54 y.o. female who is being seen today for the evaluation of hypotension at the request of Jody Neer, MD.  I reviewed her emergency room records.  She was seen in the emergency room 04/05/2019 referred byMessick, Wallis Bamberg, MD was seen for both chest discomfort and rapid heart rate in the range of 130 140 bpm.   He has a history of PFO dose of the pulmonary AV  malformation events of venous to arterial shunt with bubble contrast.  As she was asymptomatic and no evidence of structural heart disease otherwise she was not advised closure.  Her high-sensitivity troponin was low at point of service no delta change on repeat at 2 hours.  Laboratory studies included a normal CBC GFR 58 cc mildly reduced creatinine 1.08 serum potassium 3.9 .   EKG in the emergency room personally reviewed sinus tachycardia nonspecific T waves Chest x-ray personally reviewed no infiltrates or finding of heart failure  I independently reviewed that echocardiogram from structural heart showed normal left ventricular size and function and a patent foramen ovale with right to left shunt.  Is no evidence of right heart enlargement or significant shunt fraction.  She has background history of palpitation skipping sensation not severe sustained.  She had COVID-19 pneumonia in October and since then has not done well she has had fatigue exercise intolerance and has developed exertional shortness of breath doing activities such as climbing stairs and walking several 100 feet to the mailbox.  She also notices her heart is rapid at rest remains greater than 100 bpm this weekend was up in the range of 140 persistently prompting her visit to the emergency room.  He had sinus tachycardia.  Associated this she is describes chest pain is constant localized to left costochondral junction and reproducible on palpation consistent with chronic costochondral pain syndrome.  As all  of her symptoms worsened exacerbated in setting COVID-19 should undergo further evaluation including a 14-day ZIO monitor and an echocardiogram.  Any abnormality on echo the LV function or strain and cardiac MRI would be appropriate.  We will go ahead and put her on a nonsteroidal anti-inflammatory prescription strength for 2 weeks to see if we can break the cycle of her chest pain.  I reviewed her emergency room visits there is no  evidence of acute coronary syndrome EKG nonspecific and chest x-ray clear Chest pain is not anginal in nature it is constant has been present for weeks not exertional relieved with rest. Past Medical History:  Diagnosis Date  . Arthritis    OA in hands, hip and feet  . Chronic sinusitis   . Complication of anesthesia   . Depression   . GERD (gastroesophageal reflux disease)   . Joint pain   . Migraine   . PONV (postoperative nausea and vomiting)     Past Surgical History:  Procedure Laterality Date  . ABDOMINAL HYSTERECTOMY    . ANKLE RECONSTRUCTION Right 07/20/2015   Procedure: OPEN EXPLORATION OF LATERAL RIGHT PERONEUS BREVIS TENDON WITH REPAIR ;  Surgeon: Garald Balding, MD;  Location: [place];  Service: Orthopedics;  Laterality: Right;  . CESAREAN SECTION     x 2  . CHOLECYSTECTOMY  2012   lap choli  . MASS EXCISION  06/03/2011   Procedure: EXCISION MASS;  Surgeon: Schuyler Amor, MD;  Location: [place];  Service: Orthopedics;  Laterality: Right;  EXCISION MASS RIGHT MIDDLE FINGER   . NASAL SINUS SURGERY  2007, 2010    Current Medications: Current Meds  Medication Sig  . albuterol ([name];[name]) 108 (90 Base) MCG/ACT inhaler   . baclofen ([name]) 10 MG tablet Take 1 tablet (10 mg total) by mouth 3 (three) times daily as needed for muscle spasms.  . Cholecalciferol ([name]) 2000 units CAPS Take 1 capsule by mouth daily.  [name] Kitchen co-enzyme Q-10 50 MG capsule Take 100 mg by mouth daily.  . diclofenac (VOLTAREN) 50 MG EC tablet Take 50 mg by mouth as needed.  . Diclofenac Potassium (CAMBIA) 50 MG PACK Take 50 mg by mouth as directed.  . DULoxetine (CYMBALTA) 60 MG capsule Take 60 mg by mouth daily.  [name] Kitchen escitalopram (LEXAPRO) 10 MG tablet   . esomeprazole ([name]) 40 MG capsule Take 40 mg by mouth daily at 12 noon.  . famotidine (PEPCID) 40 MG tablet Take 40 mg by mouth daily.  . Galcanezumab-gnlm (EMGALITY) 120 MG/ML SOAJ  Inject 120 mg into the skin every 30 (thirty) days.  . magnesium oxide ([name]) 400 MG tablet Take 400 mg by mouth daily.  . ondansetron (ZOFRAN) 8 MG tablet Take 8 mg by mouth as needed for nausea or vomiting.  . OSPHENA 60 MG TABS Take 1 tablet by mouth 3 (three) times a week.  [name] Kitchen PARoxetine Mesylate 7.5 MG CAPS Take 1 capsule by mouth at bedtime.  . Rimegepant Sulfate (NURTEC) 75 MG TBDP Take 1 tablet by mouth daily as needed.  . topiramate ER ([name]) 200 MG CS24 sprinkle capsule Take 1 capsule (200 mg total) by mouth at bedtime.  . triamcinolone cream (KENALOG) 0.1 % Apply 1 application topically as needed.  . [DISCONTINUED] diclofenac (VOLTAREN) 50 MG EC tablet TAKE 1 TABLET BY MOUTH EVERY DAY AS NEEDED     Allergies:   Aspirin, Ibuprofen, Metoclopramide hcl, Sulfa antibiotics, Triptans, Zithromax [azithromycin dihydrate], and  Dilaudid [hydromorphone hcl]   Social History   Socioeconomic History  . Marital status: Married    Spouse name: Clair Gulling  . Number of children: 3  . Years of education: Not on file  . Highest education level: Master's degree (e.g., MA, MS, MEng, MEd, MSW, MBA)  Occupational History  . Occupation: Herbalist: [employer]  Tobacco Use  . Smoking status: Former Smoker    Packs/day: 0.25    Years: 5.00    Pack years: 1.25    Types: Cigarettes    Quit date: 05/30/1989    Years since quitting: 29.8  . Smokeless tobacco: Never Used  . Tobacco comment: social smoker per pt  Substance and Sexual Activity  . Alcohol use: Yes    Comment: occasionally  . Drug use: No  . Sexual activity: Yes  Other Topics Concern  . Not on file  Social History Narrative   Married, lives with husband. Drinks 2 cups of coffee a day, an occasional soda. Exercises 3 X a week.   Social Determinants of Health   Financial Resource Strain:   . Difficulty of Paying Living Expenses: Not on file  Food Insecurity:   . Worried About Charity fundraiser in the Last  Year: Not on file  . Ran Out of Food in the Last Year: Not on file  Transportation Needs:   . Lack of Transportation (Medical): Not on file  . Lack of Transportation (Non-Medical): Not on file  Physical Activity:   . Days of Exercise per Week: Not on file  . Minutes of Exercise per Session: Not on file  Stress:   . Feeling of Stress : Not on file  Social Connections:   . Frequency of Communication with Friends and Family: Not on file  . Frequency of Social Gatherings with Friends and Family: Not on file  . Attends Religious Services: Not on file  . Active Member of Clubs or Organizations: Not on file  . Attends Archivist Meetings: Not on file  . Marital Status: Not on file     Family History: The patient's family history includes Diabetes in her father, maternal grandfather, mother, and paternal grandfather; Heart attack in her father, maternal grandfather, and paternal grandfather; High blood pressure in her brother; Hypertension in her father, maternal grandfather, and paternal grandfather.  ROS:   Review of Systems  Constitution: Positive for malaise/fatigue.  HENT: Negative.   Eyes: Negative.   Cardiovascular: Positive for chest pain, dyspnea on exertion and palpitations.  Respiratory: Negative.   Endocrine: Positive for polydipsia.  Hematologic/Lymphatic: Negative.   Skin: Negative.   Musculoskeletal: Negative.   Gastrointestinal: Negative.   Genitourinary: Negative.   Neurological: Negative.   Psychiatric/Behavioral: Negative.   Allergic/Immunologic: Negative.    Please see the history of present illness.     All other systems reviewed and are negative.  EKGs/Labs/Other Studies Reviewed:    The following studies were reviewed today: CTA of her chest performed 07/14/2017 with a small pulmonary AVM noted in the right middle lobe, lung images were normal and cardiovascular images were normal without coronary calcification   Recent Labs: 04/05/2019: BUN 18;  Creatinine, Ser 1.08; Hemoglobin 13.8; Platelets 274; Potassium 3.9; Sodium 141  Recent Lipid Panel No results found for: CHOL, TRIG, HDL, CHOLHDL, VLDL, LDLCALC, LDLDIRECT  Physical Exam:    VS:  BP 98/74   Pulse (!) 101   Temp (!) 97.1 F (36.2 C)   Ht 5' 5.5" (  1.664 m)   Wt 215 lb 1.3 oz (97.6 kg)   SpO2 98%   BMI 35.25 kg/m     Wt Readings from Last 3 Encounters:  04/06/19 215 lb 1.3 oz (97.6 kg)  04/05/19 210 lb (95.3 kg)  08/20/18 210 lb (95.3 kg)     GEN:  Well nourished, well developed in no acute distress HEENT: Normal NECK: No JVD; No carotid bruits LYMPHATICS: No lymphadenopathy CARDIAC: Costochondral junction reproduces her symptoms RRR, no murmurs, rubs, gallops RESPIRATORY:  Clear to auscultation without rales, wheezing or rhonchi  ABDOMEN: Soft, non-tender, non-distended MUSCULOSKELETAL:  No edema; No deformity  SKIN: Warm and dry NEUROLOGIC:  Alert and oriented x 3 PSYCHIATRIC:  Normal affect     Signed, Jody More, MD  04/06/2019 4:26 PM    [place]

## 2019-04-05 NOTE — Discharge Instructions (Signed)
As discussed, your EKG and labs looked good today. You are not having a heart attack. Your oxygen was good when you were here in the ED. I have included the number of the cardiologist for your to call to schedule an appointment. I would call today to schedule an appointment as soon as possible. Return to the ER for worsening chest pain or shortness of breath.

## 2019-04-06 ENCOUNTER — Ambulatory Visit (INDEPENDENT_AMBULATORY_CARE_PROVIDER_SITE_OTHER): Payer: BC Managed Care – PPO | Admitting: Cardiology

## 2019-04-06 ENCOUNTER — Ambulatory Visit (INDEPENDENT_AMBULATORY_CARE_PROVIDER_SITE_OTHER): Payer: BC Managed Care – PPO

## 2019-04-06 ENCOUNTER — Encounter: Payer: Self-pay | Admitting: Cardiology

## 2019-04-06 VITALS — BP 98/74 | HR 101 | Temp 97.1°F | Ht 65.5 in | Wt 215.1 lb

## 2019-04-06 DIAGNOSIS — R002 Palpitations: Secondary | ICD-10-CM

## 2019-04-06 DIAGNOSIS — U071 COVID-19: Secondary | ICD-10-CM | POA: Insufficient documentation

## 2019-04-06 DIAGNOSIS — R0789 Other chest pain: Secondary | ICD-10-CM | POA: Diagnosis not present

## 2019-04-06 DIAGNOSIS — I959 Hypotension, unspecified: Secondary | ICD-10-CM | POA: Diagnosis not present

## 2019-04-06 MED ORDER — CELECOXIB 100 MG PO CAPS: 100.0000 mg | ORAL_CAPSULE | Freq: Two times a day (BID) | ORAL | 0 refills | Status: DC

## 2019-04-06 NOTE — Patient Instructions (Addendum)
Medication Instructions:  Start Celebrex 100 mg twice a day for 15 days   *If you need a refill on your cardiac medications before your next appointment, please call your pharmacy*  Lab Work: None ordered  If you have labs (blood work) drawn today and your tests are completely normal, you will receive your results only by: [name] Kitchen MyChart Message (if you have MyChart) OR . A paper copy in the mail If you have any lab test that is abnormal or we need to change your treatment, we will call you to review the results.  Testing/Procedures: A zio monitor was ordered today. It will remain on for 14 days. You will then return monitor and event diary in provided box. It takes 1-2 weeks for report to be downloaded and returned to [place]. We will call you with the results. If monitor falls off or has orange flashing light, please call Zio for further instructions.   Your physician has requested that you have an echocardiogram. Echocardiography is a painless test that uses sound waves to create images of your heart. It provides your doctor with information about the size and shape of your heart and how well your heart's chambers and valves are working. This procedure takes approximately one hour. There are no restrictions for this procedure.    Follow-Up: At [facility], you and your health needs are our priority.  As part of our continuing mission to provide you with exceptional heart care, we have created designated Provider Care Teams.  These Care Teams include your primary Cardiologist (physician) and Advanced Practice Providers (APPs -  Physician Assistants and Nurse Practitioners) who all work together to provide you with the care you need, when you need it.  Your next appointment:   3-4 week(s)  The format for your next appointment:   In Person  Provider:   Shirlee More, MD  Other Instructions None   Costochondritis Costochondritis is swelling and irritation (inflammation) of the tissue  (cartilage) that connects your ribs to your breastbone (sternum). This causes pain in the front of your chest. Usually, the pain:  Starts gradually.  Is in more than one rib. This condition usually goes away on its own over time. Follow these instructions at home:  Do not do anything that makes your pain worse.  If directed, put ice on the painful area: ? Put ice in a plastic bag. ? Place a towel between your skin and the bag. ? Leave the ice on for 20 minutes, 2-3 times a day.  If directed, put heat on the affected area as often as told by your doctor. Use the heat source that your doctor tells you to use, such as a moist heat pack or a heating pad. ? Place a towel between your skin and the heat source. ? Leave the heat on for 20-30 minutes. ? Take off the heat if your skin turns bright red. This is very important if you cannot feel pain, heat, or cold. You may have a greater risk of getting burned.  Take over-the-counter and prescription medicines only as told by your doctor.  Return to your normal activities as told by your doctor. Ask your doctor what activities are safe for you.  Keep all follow-up visits as told by your doctor. This is important. Contact a doctor if:  You have chills or a fever.  Your pain does not go away or it gets worse.  You have a cough that does not go away. Get help right  away if:  You are short of breath. This information is not intended to replace advice given to you by your health care provider. Make sure you discuss any questions you have with your health care provider. Document Revised: 02/26/2017 Document Reviewed: 06/07/2015 Elsevier Patient Education  2020 [employer].

## 2019-04-07 NOTE — Addendum Note (Signed)
Addended by: Mendel Ryder on: 04/07/2019 11:56 AM   Modules accepted: Orders

## 2019-04-09 NOTE — Progress Notes (Deleted)
Office Visit Note  Patient: Jody Blackwell             Date of Birth: 07-20-1965           MRN: [id number]             PCP: Mayra Neer, MD Referring: Mayra Neer, MD Visit Date: 04/16/2019 Occupation: @GUAROCC @  Subjective:  No chief complaint on file.   History of Present Illness: Jody Blackwell is a 54 y.o. female ***   Activities of Daily Living:  Patient reports morning stiffness for *** {minute/hour:19697}.   Patient {ACTIONS;DENIES/REPORTS:21021675::"Denies"} nocturnal pain.  Difficulty dressing/grooming: {ACTIONS;DENIES/REPORTS:21021675::"Denies"} Difficulty climbing stairs: {ACTIONS;DENIES/REPORTS:21021675::"Denies"} Difficulty getting out of chair: {ACTIONS;DENIES/REPORTS:21021675::"Denies"} Difficulty using hands for taps, buttons, cutlery, and/or writing: {ACTIONS;DENIES/REPORTS:21021675::"Denies"}  No Rheumatology ROS completed.   PMFS History:  Patient Active Problem List   Diagnosis Date Noted   COVID-19 virus infection 04/06/2019   Peroneal tendon tear 07/20/2015   Left ankle pain 08/03/2010   Left shoulder pain 06/29/2010    Past Medical History:  Diagnosis Date   Arthritis    OA in hands, hip and feet   Chronic sinusitis    Complication of anesthesia    Depression    GERD (gastroesophageal reflux disease)    Joint pain    Migraine    PONV (postoperative nausea and vomiting)     Family History  Problem Relation Age of Onset   Diabetes Mother    Diabetes Father    Heart attack Father    Hypertension Father    Diabetes Maternal Grandfather    Heart attack Maternal Grandfather    Hypertension Maternal Grandfather    Diabetes Paternal Grandfather    Heart attack Paternal Grandfather    Hypertension Paternal Grandfather    High blood pressure Brother    Past Surgical History:  Procedure Laterality Date   ABDOMINAL HYSTERECTOMY     ANKLE RECONSTRUCTION Right 07/20/2015   Procedure: OPEN EXPLORATION OF  LATERAL RIGHT PERONEUS BREVIS TENDON WITH REPAIR ;  Surgeon: Garald Balding, MD;  Location: [place];  Service: Orthopedics;  Laterality: Right;   CESAREAN SECTION     x 2   CHOLECYSTECTOMY  2012   lap choli   MASS EXCISION  06/03/2011   Procedure: EXCISION MASS;  Surgeon: Schuyler Amor, MD;  Location: [place];  Service: Orthopedics;  Laterality: Right;  EXCISION MASS RIGHT MIDDLE FINGER    NASAL SINUS SURGERY  2007, 2010   Social History   Social History Narrative   Married, lives with husband. Drinks 2 cups of coffee a day, an occasional soda. Exercises 3 X a week.   Immunization History  Administered Date(s) Administered   Influenza Inj Mdck Quad Pf 12/02/2017   Influenza Split 11/21/2015   Influenza-Unspecified 11/19/2016     Objective: Vital Signs: There were no vitals taken for this visit.   Physical Exam   Musculoskeletal Exam: ***  CDAI Exam: CDAI Score: -- Patient Global: --; Provider Global: -- Swollen: --; Tender: -- Joint Exam 04/16/2019   No joint exam has been documented for this visit   There is currently no information documented on the homunculus. Go to the Rheumatology activity and complete the homunculus joint exam.  Investigation: No additional findings.  Imaging: DG Chest Portable 1 View  Result Date: 04/05/2019 CLINICAL DATA:  54 year old female with chest pain.  Palpitations. EXAM: PORTABLE CHEST 1 VIEW COMPARISON:  Chest radiographs 01/20/2019 and earlier. FINDINGS: Portable AP upright view  at 1036 hours. Lower lung volumes. Mediastinal contours remain normal. Visualized tracheal air column is within normal limits. Allowing for portable technique the lungs are clear. No pneumothorax or pleural effusion. Stable cholecystectomy clips. Negative visible bowel gas pattern. No acute osseous abnormality identified. IMPRESSION: Lower lung volumes, otherwise negative portable chest. Electronically Signed   By:  Genevie Ann M.D.   On: 04/05/2019 10:44    Recent Labs: Lab Results  Component Value Date   WBC 6.2 04/05/2019   HGB 13.8 04/05/2019   PLT 274 04/05/2019   NA 141 04/05/2019   K 3.9 04/05/2019   CL 111 04/05/2019   CO2 22 04/05/2019   GLUCOSE 114 (H) 04/05/2019   BUN 18 04/05/2019   CREATININE 1.08 (H) 04/05/2019   BILITOT 0.4 09/19/2016   ALKPHOS 43 09/19/2016   AST 11 09/19/2016   ALT 12 09/19/2016   PROT 6.6 09/19/2016   ALBUMIN 3.9 09/19/2016   CALCIUM 9.3 04/05/2019   GFRAA >60 04/05/2019    Speciality Comments: No specialty comments available.  Procedures:  No procedures performed Allergies: Aspirin, Ibuprofen, Metoclopramide hcl, Sulfa antibiotics, Triptans, Zithromax [azithromycin dihydrate], and Dilaudid [hydromorphone hcl]   Assessment / Plan:     Visit Diagnoses: Fibromyalgia - Previous pt in 2017, Cymbalta 60 mg 1 capsule daily  Polyarthralgia  Hx of migraines  History of gastroesophageal reflux (GERD)  History of depression  Orders: No orders of the defined types were placed in this encounter.  No orders of the defined types were placed in this encounter.   Face-to-face time spent with patient was *** minutes. Greater than 50% of time was spent in counseling and coordination of care.  Follow-Up Instructions: No follow-ups on file.   Ofilia Neas, PA-C  Note - This record has been created using Dragon software.  Chart creation errors have been sought, but may not always  have been located. Such creation errors do not reflect on  the standard of medical care.

## 2019-04-13 ENCOUNTER — Other Ambulatory Visit: Payer: Self-pay | Admitting: Neurology

## 2019-04-13 DIAGNOSIS — R0789 Other chest pain: Secondary | ICD-10-CM

## 2019-04-16 ENCOUNTER — Other Ambulatory Visit (HOSPITAL_BASED_OUTPATIENT_CLINIC_OR_DEPARTMENT_OTHER): Payer: BC Managed Care – PPO

## 2019-04-16 ENCOUNTER — Ambulatory Visit: Payer: Self-pay | Admitting: Rheumatology

## 2019-04-16 ENCOUNTER — Ambulatory Visit (HOSPITAL_BASED_OUTPATIENT_CLINIC_OR_DEPARTMENT_OTHER): Payer: BC Managed Care – PPO

## 2019-04-22 ENCOUNTER — Encounter: Payer: Self-pay | Admitting: *Deleted

## 2019-04-22 NOTE — Telephone Encounter (Signed)
I called [facility] [phone] to tell them the PA came back as not needing PA and they investigated this a little and found out it must be dispensed from a specific manufacturer. They said they can get it from that manufacturer by tomorrow I called Ms Megill and informed her of this. I asked her to call us back if she has any other problems with this medication. They said her cost will be $zero. She said yes her last refill cost her nothing. She was appreciative and will call if she has any other problems.

## 2019-04-22 NOTE — Progress Notes (Signed)
Angelise DANA Cockrum KeyLoleta Chance - PA Case ID: [id number] Need help? Call us at [phone] Outcome Additional Information Required Prior Authorization not required for patient/medication. Drug Topiramate ER 200MG  er sprinkle capsules Form Forensic psychologist Prior Authorization Form ([employer]) (CB) 2017 NCPDP  I called [facility] [phone] to tell them the PA came back as not needing PA and they investigated this a little and found out it must be dispensed from a specific manufacturer. They said they can get it from that manufacturer by tomorrow I called Ms Sanderfer and informed her of this. I asked her to call us back if she has any other problems with this medication.

## 2019-04-23 DIAGNOSIS — R232 Flushing: Secondary | ICD-10-CM | POA: Diagnosis not present

## 2019-04-23 NOTE — Progress Notes (Signed)
Jody Blackwell Key: [id number] - PA Case [id number] Need help? Call us at [phone] Archivedon February 25 Outcome Approvedon February 25 Approved for Topiramate Capsule ER 24HR Sprinkle 200 MG, quantity 30 capsule per 30 days Drug Topiramate ER 200MG  er sprinkle capsules Form Forensic psychologist Prior Authorization Form ([employer]) (CB) 2017 NCPDP

## 2019-04-23 NOTE — Progress Notes (Deleted)
Office Visit Note  Patient: Jody Blackwell             Date of Birth: 06-Mar-1965           MRN: [id number]             PCP: Lennie Odor, PA Referring: Mayra Neer, MD Visit Date: 04/30/2019 Occupation: @GUAROCC @  Subjective:  No chief complaint on file.   History of Present Illness: Jody Blackwell is a 54 y.o. female ***   Activities of Daily Living:  Patient reports morning stiffness for *** {minute/hour:19697}.   Patient {ACTIONS;DENIES/REPORTS:21021675::"Denies"} nocturnal pain.  Difficulty dressing/grooming: {ACTIONS;DENIES/REPORTS:21021675::"Denies"} Difficulty climbing stairs: {ACTIONS;DENIES/REPORTS:21021675::"Denies"} Difficulty getting out of chair: {ACTIONS;DENIES/REPORTS:21021675::"Denies"} Difficulty using hands for taps, buttons, cutlery, and/or writing: {ACTIONS;DENIES/REPORTS:21021675::"Denies"}  No Rheumatology ROS completed.   PMFS History:  Patient Active Problem List   Diagnosis Date Noted   COVID-19 virus infection 04/06/2019   Peroneal tendon tear 07/20/2015   Left ankle pain 08/03/2010   Left shoulder pain 06/29/2010    Past Medical History:  Diagnosis Date   Arthritis    OA in hands, hip and feet   Chronic sinusitis    Complication of anesthesia    Depression    GERD (gastroesophageal reflux disease)    Joint pain    Migraine    PONV (postoperative nausea and vomiting)     Family History  Problem Relation Age of Onset   Diabetes Mother    Diabetes Father    Heart attack Father    Hypertension Father    Diabetes Maternal Grandfather    Heart attack Maternal Grandfather    Hypertension Maternal Grandfather    Diabetes Paternal Grandfather    Heart attack Paternal Grandfather    Hypertension Paternal Grandfather    High blood pressure Brother    Past Surgical History:  Procedure Laterality Date   ABDOMINAL HYSTERECTOMY     ANKLE RECONSTRUCTION Right 07/20/2015   Procedure: OPEN EXPLORATION OF  LATERAL RIGHT PERONEUS BREVIS TENDON WITH REPAIR ;  Surgeon: Garald Balding, MD;  Location: [facility];  Service: Orthopedics;  Laterality: Right;   CESAREAN SECTION     x 2   CHOLECYSTECTOMY  2012   lap choli   MASS EXCISION  06/03/2011   Procedure: EXCISION MASS;  Surgeon: Schuyler Amor, MD;  Location: [facility];  Service: Orthopedics;  Laterality: Right;  EXCISION MASS RIGHT MIDDLE FINGER    NASAL SINUS SURGERY  2007, 2010   Social History   Social History Narrative   Married, lives with husband. Drinks 2 cups of coffee a day, an occasional soda. Exercises 3 X a week.   Immunization History  Administered Date(s) Administered   Influenza Inj Mdck Quad Pf 12/02/2017   Influenza Split 11/21/2015   Influenza-Unspecified 11/19/2016     Objective: Vital Signs: There were no vitals taken for this visit.   Physical Exam   Musculoskeletal Exam: ***  CDAI Exam: CDAI Score: -- Patient Global: --; Provider Global: -- Swollen: --; Tender: -- Joint Exam 04/30/2019   No joint exam has been documented for this visit   There is currently no information documented on the homunculus. Go to the Rheumatology activity and complete the homunculus joint exam.  Investigation: No additional findings.  Imaging: DG Chest Portable 1 View  Result Date: 04/05/2019 CLINICAL DATA:  54 year old female with chest pain.  Palpitations. EXAM: PORTABLE CHEST 1 VIEW COMPARISON:  Chest radiographs 01/20/2019 and earlier. FINDINGS: Portable AP upright view  at 1036 hours. Lower lung volumes. Mediastinal contours remain normal. Visualized tracheal air column is within normal limits. Allowing for portable technique the lungs are clear. No pneumothorax or pleural effusion. Stable cholecystectomy clips. Negative visible bowel gas pattern. No acute osseous abnormality identified. IMPRESSION: Lower lung volumes, otherwise negative portable chest. Electronically Signed   By:  Genevie Ann M.D.   On: 04/05/2019 10:44    Recent Labs: Lab Results  Component Value Date   WBC 6.2 04/05/2019   HGB 13.8 04/05/2019   PLT 274 04/05/2019   NA 141 04/05/2019   K 3.9 04/05/2019   CL 111 04/05/2019   CO2 22 04/05/2019   GLUCOSE 114 (H) 04/05/2019   BUN 18 04/05/2019   CREATININE 1.08 (H) 04/05/2019   BILITOT 0.4 09/19/2016   ALKPHOS 43 09/19/2016   AST 11 09/19/2016   ALT 12 09/19/2016   PROT 6.6 09/19/2016   ALBUMIN 3.9 09/19/2016   CALCIUM 9.3 04/05/2019   GFRAA >60 04/05/2019    Speciality Comments: No specialty comments available.  Procedures:  No procedures performed Allergies: Aspirin, Ibuprofen, Metoclopramide hcl, Sulfa antibiotics, Triptans, Zithromax [azithromycin dihydrate], and Dilaudid [hydromorphone hcl]   Assessment / Plan:     Visit Diagnoses: No diagnosis found.  Orders: No orders of the defined types were placed in this encounter.  No orders of the defined types were placed in this encounter.   Face-to-face time spent with patient was *** minutes. Greater than 50% of time was spent in counseling and coordination of care.  Follow-Up Instructions: No follow-ups on file.   Ofilia Neas, PA-C  Note - This record has been created using Dragon software.  Chart creation errors have been sought, but may not always  have been located. Such creation errors do not reflect on  the standard of medical care.

## 2019-04-27 ENCOUNTER — Other Ambulatory Visit: Payer: Self-pay

## 2019-04-27 ENCOUNTER — Ambulatory Visit (HOSPITAL_BASED_OUTPATIENT_CLINIC_OR_DEPARTMENT_OTHER)
Admission: RE | Admit: 2019-04-27 | Discharge: 2019-04-27 | Disposition: A | Discharge status: Home / Self Care | Payer: BC Managed Care – PPO | Source: Ambulatory Visit | Attending: Cardiology | Admitting: Cardiology

## 2019-04-27 DIAGNOSIS — R002 Palpitations: Secondary | ICD-10-CM | POA: Diagnosis not present

## 2019-04-27 DIAGNOSIS — R0789 Other chest pain: Secondary | ICD-10-CM | POA: Diagnosis not present

## 2019-04-27 NOTE — Progress Notes (Signed)
  Echocardiogram 2D Echocardiogram has been performed.  Jody Blackwell 04/27/2019, 8:43 AM

## 2019-04-29 ENCOUNTER — Telehealth: Payer: Self-pay

## 2019-04-29 NOTE — Telephone Encounter (Signed)
-----   Message from Richardo Priest, MD sent at 04/29/2019  1:38 PM EST ----- Normal or stable result

## 2019-04-29 NOTE — Telephone Encounter (Signed)
The patient has been notified of the Echo result and verbalized understanding.  All questions (if any) were answered. Frederik Schmidt, RN 04/29/2019 2:09 PM

## 2019-04-30 ENCOUNTER — Ambulatory Visit: Payer: Self-pay | Admitting: Rheumatology

## 2019-05-04 ENCOUNTER — Ambulatory Visit: Payer: BC Managed Care – PPO | Admitting: Cardiology

## 2019-05-04 ENCOUNTER — Other Ambulatory Visit: Payer: Self-pay | Admitting: Neurology

## 2019-05-04 MED ORDER — PREDNISONE 10 MG PO TABS: ORAL_TABLET | ORAL | 0 refills | Status: DC

## 2019-05-07 ENCOUNTER — Other Ambulatory Visit: Payer: Self-pay

## 2019-05-07 ENCOUNTER — Encounter: Payer: Self-pay | Admitting: Cardiology

## 2019-05-07 ENCOUNTER — Ambulatory Visit: Payer: BC Managed Care – PPO | Admitting: Cardiology

## 2019-05-07 VITALS — BP 100/70 | HR 104 | Temp 97.5°F | Ht 65.5 in | Wt 216.0 lb

## 2019-05-07 DIAGNOSIS — R002 Palpitations: Secondary | ICD-10-CM

## 2019-05-07 DIAGNOSIS — R0789 Other chest pain: Secondary | ICD-10-CM

## 2019-05-07 DIAGNOSIS — I959 Hypotension, unspecified: Secondary | ICD-10-CM | POA: Diagnosis not present

## 2019-05-07 DIAGNOSIS — U071 COVID-19: Secondary | ICD-10-CM

## 2019-05-07 MED ORDER — MIDODRINE HCL 5 MG PO TABS: 5.0000 mg | ORAL_TABLET | Freq: Two times a day (BID) | ORAL | 1 refills | Status: DC

## 2019-05-07 NOTE — Patient Instructions (Signed)
Medication Instructions:  Your physician has recommended you make the following change in your medication:  1.  START Midodrine 5 mg taking 1 twice a day.  Monitor your blood pressure at home.  If you notice your top # is running over 140 frequently, please call the office.   *If you need a refill on your cardiac medications before your next appointment, please call your pharmacy*   Lab Work: None ordered  If you have labs (blood work) drawn today and your tests are completely normal, you will receive your results only by: [name] Kitchen MyChart Message (if you have MyChart) OR . A paper copy in the mail If you have any lab test that is abnormal or we need to change your treatment, we will call you to review the results.   Testing/Procedures: None ordered   Follow-Up: At [facility], you and your health needs are our priority.  As part of our continuing mission to provide you with exceptional heart care, we have created designated Provider Care Teams.  These Care Teams include your primary Cardiologist (physician) and Advanced Practice Providers (APPs -  Physician Assistants and Nurse Practitioners) who all work together to provide you with the care you need, when you need it.  We recommend signing up for the patient portal called "MyChart".  Sign up information is provided on this After Visit Summary.  MyChart is used to connect with patients for Virtual Visits (Telemedicine).  Patients are able to view lab/test results, encounter notes, upcoming appointments, etc.  Non-urgent messages can be sent to your provider as well.   To learn more about what you can do with MyChart, go to [email or web address].    Your next appointment:   3 month(s)  The format for your next appointment:   In Person  Provider:   Shirlee More, MD   Other Instructions

## 2019-05-07 NOTE — Progress Notes (Signed)
Cardiology Office Note:    Date:  05/07/2019   ID:  Jody Blackwell, DOB Jul 04, 1965, MRN [id number]  PCP:  Lennie Odor, PA  Cardiologist:  Shirlee More, MD    Referring MD: Mayra Neer, MD    ASSESSMENT:    1. Palpitations   2. Costochondral chest pain   3. Hypotension, unspecified hypotension type   4. COVID-19 virus infection    PLAN:    In order of problems listed above:  1. Predominant problem is autonomic dysfunction hypotension reflux tachycardia she will continue adding salt to her diet add midodrine monitor blood pressure at home. 2. Improved with her nonsteroidal anti-inflammatory drug   Next appointment: 3 months   Medication Adjustments/Labs and Tests Ordered: Current medicines are reviewed at length with the patient today.  Concerns regarding medicines are outlined above.  No orders of the defined types were placed in this encounter.  Meds ordered this encounter  Medications  . midodrine (PROAMATINE) 5 MG tablet    Sig: Take 1 tablet (5 mg total) by mouth 2 (two) times daily with a meal.    Dispense:  180 tablet    Refill:  1    No chief complaint on file.   History of Present Illness:    Jody Blackwell is a 55 y.o. female with a hx of pulmonary AV malformation patent foramen ovale chest discomfort and palpitation.  Last seen 04/06/2019 with costochondral chest pain and palpitation..She had COVID-19 pneumonia in October and since then has not done well   Compliance with diet, lifestyle and medications: Yes  Echocardiogram 04/27/2019 showed normal left ventricular size and function and no evidence of elevated filling pressures or cardiomyopathy.  There is no valvular abnormality of note.  Her chest x-rays have shown resolution of multifocal pneumonia.  Her ZIO monitor performed for 13 days showed rare ventricular and supraventricular arrhythmia in the triggered and diary events were predominantly sinus tachycardia.  Having problems with her  heart racing and being lightheadedness and fatigue.  The background history of previous hypotension I think she is exacerbated with autonomic dysfunction post Covid and will place her on low-dose midodrine to suppress reflex tachycardia from hypotension.  Cardiac studies are reassuring and no evidence of significant arrhythmia or cardiac injury from COVID-19 Past Medical History:  Diagnosis Date  . Arthritis    OA in hands, hip and feet  . Chronic sinusitis   . Complication of anesthesia   . Depression   . GERD (gastroesophageal reflux disease)   . Joint pain   . Migraine   . PONV (postoperative nausea and vomiting)     Past Surgical History:  Procedure Laterality Date  . ABDOMINAL HYSTERECTOMY    . ANKLE RECONSTRUCTION Right 07/20/2015   Procedure: OPEN EXPLORATION OF LATERAL RIGHT PERONEUS BREVIS TENDON WITH REPAIR ;  Surgeon: Garald Balding, MD;  Location: [facility];  Service: Orthopedics;  Laterality: Right;  . CESAREAN SECTION     x 2  . CHOLECYSTECTOMY  2012   lap choli  . MASS EXCISION  06/03/2011   Procedure: EXCISION MASS;  Surgeon: Schuyler Amor, MD;  Location: [place];  Service: Orthopedics;  Laterality: Right;  EXCISION MASS RIGHT MIDDLE FINGER   . NASAL SINUS SURGERY  2007, 2010    Current Medications: Current Meds  Medication Sig  . albuterol ([name];[name]) 108 (90 Base) MCG/ACT inhaler   . baclofen ([name]) 10 MG tablet Take 1 tablet (10 mg total)  by mouth 3 (three) times daily as needed for muscle spasms.  . celecoxib (CELEBREX) 100 MG capsule Take 1 capsule (100 mg total) by mouth 2 (two) times daily.  . Cholecalciferol ([name]) 2000 units CAPS Take 1 capsule by mouth daily.  [name] Kitchen co-enzyme Q-10 50 MG capsule Take 100 mg by mouth daily.  . diclofenac (VOLTAREN) 50 MG EC tablet TAKE 1 TABLET BY MOUTH EVERY DAY AS NEEDED  . Diclofenac Potassium (CAMBIA) 50 MG PACK Take 50 mg by mouth as directed.  .  DULoxetine (CYMBALTA) 60 MG capsule Take 60 mg by mouth daily.  [name] Kitchen escitalopram (LEXAPRO) 10 MG tablet   . esomeprazole ([name]) 40 MG capsule Take 40 mg by mouth daily at 12 noon.  . famotidine (PEPCID) 40 MG tablet Take 40 mg by mouth daily.  . Galcanezumab-gnlm (EMGALITY) 120 MG/ML SOAJ Inject 120 mg into the skin every 30 (thirty) days.  . magnesium oxide ([name]) 400 MG tablet Take 400 mg by mouth daily.  . ondansetron (ZOFRAN) 8 MG tablet Take 8 mg by mouth as needed for nausea or vomiting.  . OSPHENA 60 MG TABS Take 1 tablet by mouth 3 (three) times a week.  [name] Kitchen PARoxetine Mesylate 7.5 MG CAPS Take 1 capsule by mouth at bedtime.  . predniSONE (DELTASONE) 10 MG tablet Take 60mg  on day 1, then 50mg  on day 2, then 40mg  on day 3, then 30mg  on day 4, then 20mg  on day 5, then 10mg  on day 6, then STOP  . Rimegepant Sulfate (NURTEC) 75 MG TBDP Take 1 tablet by mouth daily as needed.  . topiramate ER ([name]) 200 MG CS24 sprinkle capsule Take 1 capsule (200 mg total) by mouth at bedtime.  . triamcinolone cream (KENALOG) 0.1 % Apply 1 application topically as needed.     Allergies:   Aspirin, Ibuprofen, Metoclopramide hcl, Sulfa antibiotics, Triptans, Zithromax [azithromycin dihydrate], and Dilaudid [hydromorphone hcl]   Social History   Socioeconomic History  . Marital status: Married    Spouse name: Clair Gulling  . Number of children: 3  . Years of education: Not on file  . Highest education level: Master's degree (e.g., MA, MS, MEng, MEd, MSW, MBA)  Occupational History  . Occupation: Herbalist: [employer]  Tobacco Use  . Smoking status: Former Smoker    Packs/day: 0.25    Years: 5.00    Pack years: 1.25    Types: Cigarettes    Quit date: 05/30/1989    Years since quitting: 29.9  . Smokeless tobacco: Never Used  . Tobacco comment: social smoker per pt  Substance and Sexual Activity  . Alcohol use: Yes    Comment: occasionally  . Drug use: No  . Sexual activity: Yes    Other Topics Concern  . Not on file  Social History Narrative   Married, lives with husband. Drinks 2 cups of coffee a day, an occasional soda. Exercises 3 X a week.   Social Determinants of Health   Financial Resource Strain:   . Difficulty of Paying Living Expenses:   Food Insecurity:   . Worried About Charity fundraiser in the Last Year:   . Arboriculturist in the Last Year:   Transportation Needs:   . Film/video editor (Medical):   [name] Kitchen Lack of Transportation (Non-Medical):   Physical Activity:   . Days of Exercise per Week:   . Minutes of Exercise per Session:   Stress:   . Feeling  of Stress :   Social Connections:   . Frequency of Communication with Friends and Family:   . Frequency of Social Gatherings with Friends and Family:   . Attends Religious Services:   . Active Member of Clubs or Organizations:   . Attends Archivist Meetings:   [name] Kitchen Marital Status:      Family History: The patient's family history includes Diabetes in her father, maternal grandfather, mother, and paternal grandfather; Heart attack in her father, maternal grandfather, and paternal grandfather; High blood pressure in her brother; Hypertension in her father, maternal grandfather, and paternal grandfather. ROS:   Please see the history of present illness.    All other systems reviewed and are negative.  EKGs/Labs/Other Studies Reviewed:    The following studies were reviewed today:    Recent Labs: 04/05/2019: BUN 18; Creatinine, Ser 1.08; Hemoglobin 13.8; Platelets 274; Potassium 3.9; Sodium 141  Recent Lipid Panel No results found for: CHOL, TRIG, HDL, CHOLHDL, VLDL, LDLCALC, LDLDIRECT  Physical Exam:    VS:  BP 100/70   Pulse (!) 104   Temp (!) 97.5 F (36.4 C)   Ht 5' 5.5" (1.664 m)   Wt 216 lb (98 kg)   LMP  (LMP Unknown)   SpO2 97%   BMI 35.40 kg/m     Wt Readings from Last 3 Encounters:  05/07/19 216 lb (98 kg)  04/06/19 215 lb 1.3 oz (97.6 kg)  04/05/19 210 lb  (95.3 kg)     GEN:  Well nourished, well developed in no acute distress HEENT: Normal NECK: No JVD; No carotid bruits LYMPHATICS: No lymphadenopathy CARDIAC: RRR, no murmurs, rubs, gallops RESPIRATORY:  Clear to auscultation without rales, wheezing or rhonchi  ABDOMEN: Soft, non-tender, non-distended MUSCULOSKELETAL:  No edema; No deformity  SKIN: Warm and dry NEUROLOGIC:  Alert and oriented x 3 PSYCHIATRIC:  Normal affect    Signed, Shirlee More, MD  05/07/2019 4:15 PM    [facility]

## 2019-05-13 ENCOUNTER — Ambulatory Visit: Payer: Self-pay | Admitting: Rheumatology

## 2019-05-19 ENCOUNTER — Other Ambulatory Visit: Payer: Self-pay | Admitting: Neurology

## 2019-05-20 DIAGNOSIS — Z20822 Contact with and (suspected) exposure to covid-19: Secondary | ICD-10-CM | POA: Diagnosis not present

## 2019-06-05 DIAGNOSIS — Z20822 Contact with and (suspected) exposure to covid-19: Secondary | ICD-10-CM | POA: Diagnosis not present

## 2019-06-05 DIAGNOSIS — Z20828 Contact with and (suspected) exposure to other viral communicable diseases: Secondary | ICD-10-CM | POA: Diagnosis not present

## 2019-07-05 NOTE — Progress Notes (Signed)
Office Visit Note  Patient: Jody Blackwell             Date of Birth: Jan 17, 1966           MRN: [id number]             PCP: Lennie Odor, PA Referring: Mayra Neer, MD Visit Date: 07/13/2019 Occupation: @GUAROCC @  Subjective:  Pain in all the muscles.   History of Present Illness: Jody Blackwell is a 54 y.o. female seen in consultation per request of Dr. Brigitte Pulse.  Patient was initially evaluated by me on May 09, 2015.  At that time she presented with pain in multiple joints.  She was experiencing nocturnal pain as well.  She was on Mobic for migraines by Dr. Domingo Cocking.  She is concerned about rheumatoid arthritis due to family history of rheumatoid arthritis in her daughter.  She was also suffering from chronic insomnia.  At that time extensive lab work was done which was consistent with [name] deficiency.  She also had very low insignificant ANA titer all other autoimmune work-up was negative.  She had x-rays which were consistent with early osteoarthritic changes in her hands, knee joints and her feet.  At the time she also had trochanteric bursitis.  She states she has been having recurrent problems with trochanteric bursitis.  She was also given the diagnosis of myofascial pain based on her history of with hyperalgesia and generalized pain.  She states her fibromyalgia has been getting worse with frequent flares.  She has flares with migraine headaches and sometimes with the change in weather.  She states she was placed on Cymbalta by [facility] which has helped her to some extent but not much.  She is describes hyperalgesia and severe pain with these episodes.  She denies any joint swelling.  Activities of Daily Living:  Patient reports morning stiffness for 0  minutes.   Patient Reports nocturnal pain.  Difficulty dressing/grooming: Denies Difficulty climbing stairs: Reports Difficulty getting out of chair: Denies Difficulty using hands for taps, buttons, cutlery, and/or  writing: Denies  Review of Systems  Constitutional: Positive for fatigue. Negative for night sweats, weight gain and weight loss.  HENT: Positive for mouth dryness. Negative for mouth sores, trouble swallowing, trouble swallowing and nose dryness.   Eyes: Negative for pain, redness, itching, visual disturbance and dryness.  Respiratory: Negative for cough, shortness of breath and difficulty breathing.   Cardiovascular: Positive for palpitations. Negative for chest pain, hypertension, irregular heartbeat and swelling in legs/feet.       Followed by cardiology   Gastrointestinal: Negative for blood in stool, constipation and diarrhea.  Endocrine: Negative for increased urination.  Genitourinary: Negative for difficulty urinating, painful urination and vaginal dryness.  Musculoskeletal: Positive for myalgias, muscle weakness, muscle tenderness and myalgias. Negative for arthralgias, joint pain, joint swelling and morning stiffness.  Skin: Positive for rash. Negative for color change, hair loss, skin tightness, ulcers and sensitivity to sunlight.  Allergic/Immunologic: Negative for susceptible to infections.  Neurological: Negative for dizziness, headaches, memory loss, night sweats and weakness.  Hematological: Negative for bruising/bleeding tendency and swollen glands.  Psychiatric/Behavioral: Positive for depressed mood and sleep disturbance. Negative for confusion. The patient is not nervous/anxious.     PMFS History:  Patient Active Problem List   Diagnosis Date Noted  . COVID-19 virus infection 04/06/2019  . Peroneal tendon tear 07/20/2015  . Left ankle pain 08/03/2010  . Left shoulder pain 06/29/2010    Past Medical History:  Diagnosis  Date  . Arthritis    OA in hands, hip and feet  . Chronic sinusitis   . Complication of anesthesia   . Depression   . GERD (gastroesophageal reflux disease)   . Joint pain   . Migraine   . PONV (postoperative nausea and vomiting)     Family  History  Problem Relation Age of Onset  . Diabetes Mother   . Diabetes Father   . Heart attack Father   . Hypertension Father   . Rheum arthritis Father   . Diabetes Maternal Grandfather   . Heart attack Maternal Grandfather   . Hypertension Maternal Grandfather   . Diabetes Paternal Grandfather   . Heart attack Paternal Grandfather   . Hypertension Paternal Grandfather   . High blood pressure Brother   . Diabetes Brother   . Asthma Son   . GER disease Son   . Rheum arthritis Daughter   . Migraines Daughter    Past Surgical History:  Procedure Laterality Date  . ABDOMINAL HYSTERECTOMY    . ANKLE RECONSTRUCTION Right 07/20/2015   Procedure: OPEN EXPLORATION OF LATERAL RIGHT PERONEUS BREVIS TENDON WITH REPAIR ;  Surgeon: Garald Balding, MD;  Location: [place];  Service: Orthopedics;  Laterality: Right;  . CESAREAN SECTION     x 2  . CHOLECYSTECTOMY  2012   lap choli  . MASS EXCISION  06/03/2011   Procedure: EXCISION MASS;  Surgeon: Schuyler Amor, MD;  Location: [place];  Service: Orthopedics;  Laterality: Right;  EXCISION MASS RIGHT MIDDLE FINGER   . NASAL SINUS SURGERY  2007, 2010   Social History   Social History Narrative   Married, lives with husband. Drinks 2 cups of coffee a day, an occasional soda. Exercises 3 X a week.   Immunization History  Administered Date(s) Administered  . Influenza Inj Mdck Quad Pf 12/02/2017  . Influenza Split 11/21/2015  . Influenza-Unspecified 11/19/2016     Objective: Vital Signs: BP 137/86 (BP Location: Right Wrist, Patient Position: Sitting, Cuff Size: Normal)   Pulse 96   Resp 15   Ht 5\' 5"  (1.651 m)   Wt 221 lb 9.6 oz (100.5 kg)   LMP  (LMP Unknown)   BMI 36.88 kg/m    Physical Exam Vitals and nursing note reviewed.  Constitutional:      Appearance: She is well-developed.  HENT:     Head: Normocephalic and atraumatic.  Eyes:     Conjunctiva/sclera: Conjunctivae normal.   Cardiovascular:     Rate and Rhythm: Normal rate and regular rhythm.     Heart sounds: Normal heart sounds.  Pulmonary:     Effort: Pulmonary effort is normal.     Breath sounds: Normal breath sounds.  Abdominal:     General: Bowel sounds are normal.     Palpations: Abdomen is soft.  Musculoskeletal:     Cervical back: Normal range of motion.  Lymphadenopathy:     Cervical: No cervical adenopathy.  Skin:    General: Skin is warm and dry.     Capillary Refill: Capillary refill takes less than 2 seconds.  Neurological:     Mental Status: She is alert and oriented to person, place, and time.  Psychiatric:        Behavior: Behavior normal.      Musculoskeletal Exam: C-spine thoracic and lumbar spine with good range of motion.  Shoulder joints, elbow joints, wrist joints, MCPs PIPs and DIPs been good range of motion  with no synovitis.  She has mild thickening of PIP and DIP joints.  Hip joints were in good range of motion.  She has tenderness over bilateral trochanteric bursa.  Knee joints, ankle joints, MTPs and PIPs with good range of motion with no synovitis.  She has generalized hyperalgesia and positive tender points.  CDAI Exam: CDAI Score: -- Patient Global: --; Provider Global: -- Swollen: --; Tender: -- Joint Exam 07/13/2019   No joint exam has been documented for this visit   There is currently no information documented on the homunculus. Go to the Rheumatology activity and complete the homunculus joint exam.  Investigation: No additional findings.  Imaging: No results found.  Recent Labs: Lab Results  Component Value Date   WBC 6.2 04/05/2019   HGB 13.8 04/05/2019   PLT 274 04/05/2019   NA 141 04/05/2019   K 3.9 04/05/2019   CL 111 04/05/2019   CO2 22 04/05/2019   GLUCOSE 114 (H) 04/05/2019   BUN 18 04/05/2019   CREATININE 1.08 (H) 04/05/2019   BILITOT 0.4 09/19/2016   ALKPHOS 43 09/19/2016   AST 11 09/19/2016   ALT 12 09/19/2016   PROT 6.6 09/19/2016    ALBUMIN 3.9 09/19/2016   CALCIUM 9.3 04/05/2019   GFRAA >60 04/05/2019   May 10, 2015 CBC normal, CMP normal, G6PD normal, CK 71, TSH normal, RF negative, SPEP negative, [name] 15, ANA 1: 80, RF negative, anti-CCP negative, 14 3 3  eta negative  Speciality Comments: No specialty comments available.   Procedures:  Large Joint Inj: L greater trochanter on 07/13/2019 9:51 AM Indications: pain Details: 27 G 1.5 in needle, lateral approach  Arthrogram: No  Medications: 40 mg triamcinolone acetonide 40 MG/ML; 1.5 mL lidocaine 1 % Aspirate: 0 mL Outcome: tolerated well, no immediate complications Procedure, treatment alternatives, risks and benefits explained, specific risks discussed. Consent was given by the patient. Immediately prior to procedure a time out was called to verify the correct patient, procedure, equipment, support staff and site/side marked as required. Patient was prepped and draped in the usual sterile fashion.     Allergies: Aspirin, Ibuprofen, Metoclopramide hcl, Sulfa antibiotics, Triptans, Zithromax [azithromycin dihydrate], and Dilaudid [hydromorphone hcl]   Assessment / Plan:     Visit Diagnoses: Fibromyalgia - previous pt in 2017.  Patient was diagnosed with fibromyalgia syndrome in the past.  She continues to have increased flares.  She states she has been and out of discomfort and experiencing a lot of fatigue.  Her symptoms have improved somewhat since she started Cymbalta.  We had detailed discussion regarding fibromyalgia.  Need for water aerobics swimming or water walking was discussed.  She would also benefit from integrative therapy referral.  Per her request I will make a referral for her.  Weight loss diet and exercise was emphasized.  Patient is trying to lose weight.  She is already on many medications and no additional medications are needed.  Trochanteric bursitis of both hips-she has bilateral trochanteric bursitis with increased pain in her left  trochanteric bursa.  Per her request left trochanteric bursa was injected with cortisone as described above.  Primary osteoarthritis of both hands-no synovitis was noted.  Joint protection was discussed.  Primary osteoarthritis of both knees-it will help her to lose weight.  She is not having any discomfort.  X-rays done at the last visit showed mild osteoarthritic changes.  Primary osteoarthritis of both feet-she is not having any discomfort.  No synovitis was noted.  [name] deficiency-vitamin  D was low in the past.  I have advised her to monitor her [name] closely and take supplement as needed.  Other medical levels are listed as follows:  Tear of peroneal tendon, right, sequela  History of gastroesophageal reflux (GERD)  Hx of migraines  History of depression  Elevated cholesterol  BMI 36.0-36.9,adult  Orders: Orders Placed This Encounter  Procedures  . Large Joint Inj   No orders of the defined types were placed in this encounter.   Face-to-face time spent with patient was 45 minutes. Greater than 50% of time was spent in counseling and coordination of care.  Follow-Up Instructions: Return if symptoms worsen or fail to improve.   Bo Merino, MD  Note - This record has been created using Editor, commissioning.  Chart creation errors have been sought, but may not always  have been located. Such creation errors do not reflect on  the standard of medical care.

## 2019-07-13 ENCOUNTER — Encounter: Payer: Self-pay | Admitting: Rheumatology

## 2019-07-13 ENCOUNTER — Ambulatory Visit: Payer: BC Managed Care – PPO | Admitting: Rheumatology

## 2019-07-13 ENCOUNTER — Other Ambulatory Visit: Payer: Self-pay

## 2019-07-13 VITALS — BP 137/86 | HR 96 | Resp 15 | Ht 65.0 in | Wt 221.6 lb

## 2019-07-13 DIAGNOSIS — M7061 Trochanteric bursitis, right hip: Secondary | ICD-10-CM

## 2019-07-13 DIAGNOSIS — M19071 Primary osteoarthritis, right ankle and foot: Secondary | ICD-10-CM

## 2019-07-13 DIAGNOSIS — M25512 Pain in left shoulder: Secondary | ICD-10-CM

## 2019-07-13 DIAGNOSIS — M170 Bilateral primary osteoarthritis of knee: Secondary | ICD-10-CM | POA: Diagnosis not present

## 2019-07-13 DIAGNOSIS — Z8669 Personal history of other diseases of the nervous system and sense organs: Secondary | ICD-10-CM

## 2019-07-13 DIAGNOSIS — M7062 Trochanteric bursitis, left hip: Secondary | ICD-10-CM

## 2019-07-13 DIAGNOSIS — M797 Fibromyalgia: Secondary | ICD-10-CM

## 2019-07-13 DIAGNOSIS — M19041 Primary osteoarthritis, right hand: Secondary | ICD-10-CM | POA: Diagnosis not present

## 2019-07-13 DIAGNOSIS — M19042 Primary osteoarthritis, left hand: Secondary | ICD-10-CM

## 2019-07-13 DIAGNOSIS — Z6836 Body mass index (BMI) 36.0-36.9, adult: Secondary | ICD-10-CM

## 2019-07-13 DIAGNOSIS — Z8659 Personal history of other mental and behavioral disorders: Secondary | ICD-10-CM

## 2019-07-13 DIAGNOSIS — M19072 Primary osteoarthritis, left ankle and foot: Secondary | ICD-10-CM

## 2019-07-13 DIAGNOSIS — E559 Vitamin D deficiency, unspecified: Secondary | ICD-10-CM

## 2019-07-13 DIAGNOSIS — S86311S Strain of muscle(s) and tendon(s) of peroneal muscle group at lower leg level, right leg, sequela: Secondary | ICD-10-CM

## 2019-07-13 DIAGNOSIS — E7800 Pure hypercholesterolemia, unspecified: Secondary | ICD-10-CM

## 2019-07-13 DIAGNOSIS — Z8719 Personal history of other diseases of the digestive system: Secondary | ICD-10-CM

## 2019-07-13 DIAGNOSIS — M25572 Pain in left ankle and joints of left foot: Secondary | ICD-10-CM

## 2019-07-13 MED ORDER — LIDOCAINE HCL 1 % IJ SOLN
1.5000 mL | INTRAMUSCULAR | Status: AC | PRN
  Administered 2019-07-13: 1.5 mL

## 2019-07-13 MED ORDER — TRIAMCINOLONE ACETONIDE 40 MG/ML IJ SUSP
40.0000 mg | INTRAMUSCULAR | Status: AC | PRN
  Administered 2019-07-13: 40 mg via INTRA_ARTICULAR

## 2019-07-13 NOTE — Addendum Note (Signed)
Addended by: Francis Gaines C on: 07/13/2019 10:05 AM   Modules accepted: Orders

## 2019-07-13 NOTE — Patient Instructions (Signed)
Iliotibial Band Syndrome Rehab Ask your health care provider which exercises are safe for you. Do exercises exactly as told by your health care provider and adjust them as directed. It is normal to feel mild stretching, pulling, tightness, or discomfort as you do these exercises. Stop right away if you feel sudden pain or your pain gets significantly worse. Do not begin these exercises until told by your health care provider. Stretching and range-of-motion exercises These exercises warm up your muscles and joints and improve the movement and flexibility of your hip and pelvis. Quadriceps stretch, prone  1. Lie on your abdomen on a firm surface, such as a bed or padded floor (prone position). 2. Bend your left / right knee and reach back to hold your ankle or pant leg. If you cannot reach your ankle or pant leg, loop a belt around your foot and grab the belt instead. 3. Gently pull your heel toward your buttocks. Your knee should not slide out to the side. You should feel a stretch in the front of your thigh and knee (quadriceps). 4. Hold this position for __________ seconds. Repeat __________ times. Complete this exercise __________ times a day. Iliotibial band stretch An iliotibial band is a strong band of muscle tissue that runs from the outer side of your hip to the outer side of your thigh and knee. 1. Lie on your side with your left / right leg in the top position. 2. Bend both of your knees and grab your left / right ankle. Stretch out your bottom arm to help you balance. 3. Slowly bring your top knee back so your thigh goes behind your trunk. 4. Slowly lower your top leg toward the floor until you feel a gentle stretch on the outside of your left / right hip and thigh. If you do not feel a stretch and your knee will not fall farther, place the heel of your other foot on top of your knee and pull your knee down toward the floor with your foot. 5. Hold this position for __________  seconds. Repeat __________ times. Complete this exercise __________ times a day. Strengthening exercises These exercises build strength and endurance in your hip and pelvis. Endurance is the ability to use your muscles for a long time, even after they get tired. Straight leg raises, side-lying This exercise strengthens the muscles that rotate the leg at the hip and move it away from your body (hip abductors). 1. Lie on your side with your left / right leg in the top position. Lie so your head, shoulder, hip, and knee line up. You may bend your bottom knee to help you balance. 2. Roll your hips slightly forward so your hips are stacked directly over each other and your left / right knee is facing forward. 3. Tense the muscles in your outer thigh and lift your top leg 4-6 inches (10-15 cm). 4. Hold this position for __________ seconds. 5. Slowly return to the starting position. Let your muscles relax completely before doing another repetition. Repeat __________ times. Complete this exercise __________ times a day. Leg raises, prone This exercise strengthens the muscles that move the hips (hip extensors). 1. Lie on your abdomen on your bed or a firm surface. You can put a pillow under your hips if that is more comfortable for your lower back. 2. Bend your left / right knee so your foot is straight up in the air. 3. Squeeze your buttocks muscles and lift your left / right thigh   off the bed. Do not let your back arch. 4. Tense your thigh muscle as hard as you can without increasing any knee pain. 5. Hold this position for __________ seconds. 6. Slowly lower your leg to the starting position and allow it to relax completely. Repeat __________ times. Complete this exercise __________ times a day. Hip hike 1. Stand sideways on a bottom step. Stand on your left / right leg with your other foot unsupported next to the step. You can hold on to the railing or wall for balance if needed. 2. Keep your knees  straight and your torso square. Then lift your left / right hip up toward the ceiling. 3. Slowly let your left / right hip lower toward the floor, past the starting position. Your foot should get closer to the floor. Do not lean or bend your knees. Repeat __________ times. Complete this exercise __________ times a day. This information is not intended to replace advice given to you by your health care provider. Make sure you discuss any questions you have with your health care provider. Document Revised: 06/04/2018 Document Reviewed: 12/03/2017 Elsevier Patient Education  2020 Elsevier Inc.  

## 2019-07-21 ENCOUNTER — Other Ambulatory Visit: Payer: Self-pay | Admitting: Neurology

## 2019-07-30 DIAGNOSIS — M7062 Trochanteric bursitis, left hip: Secondary | ICD-10-CM | POA: Diagnosis not present

## 2019-07-30 DIAGNOSIS — M797 Fibromyalgia: Secondary | ICD-10-CM | POA: Diagnosis not present

## 2019-07-30 DIAGNOSIS — M7061 Trochanteric bursitis, right hip: Secondary | ICD-10-CM | POA: Diagnosis not present

## 2019-07-31 DIAGNOSIS — J019 Acute sinusitis, unspecified: Secondary | ICD-10-CM | POA: Diagnosis not present

## 2019-08-05 DIAGNOSIS — M797 Fibromyalgia: Secondary | ICD-10-CM | POA: Diagnosis not present

## 2019-08-05 DIAGNOSIS — M7062 Trochanteric bursitis, left hip: Secondary | ICD-10-CM | POA: Diagnosis not present

## 2019-08-05 DIAGNOSIS — M7061 Trochanteric bursitis, right hip: Secondary | ICD-10-CM | POA: Diagnosis not present

## 2019-08-05 DIAGNOSIS — G43909 Migraine, unspecified, not intractable, without status migrainosus: Secondary | ICD-10-CM | POA: Diagnosis not present

## 2019-08-05 NOTE — Progress Notes (Deleted)
Cardiology Office Note:    Date:  08/05/2019   ID:  Jody Blackwell, DOB [birth year], MRN [id number]  PCP:  Lennie Odor, PA  Cardiologist:  Shirlee More, MD    Referring MD: Lennie Odor, PA    ASSESSMENT:    No diagnosis found. PLAN:    In order of problems listed above:  1. ***   Next appointment: ***   Medication Adjustments/Labs and Tests Ordered: Current medicines are reviewed at length with the patient today.  Concerns regarding medicines are outlined above.  No orders of the defined types were placed in this encounter.  No orders of the defined types were placed in this encounter.   No chief complaint on file.   History of Present Illness:    Jody Blackwell is a 54 y.o. female with a hx of  pulmonary AV malformation patent foramen ovale chest discomfort and palpitation.  She was seen 04/06/2019 with costochondral chest pain and palpitation.She had COVID-19 pneumonia in October.Echocardiogram 04/27/2019 showed normal left ventricular size and function and no evidence of elevated filling pressures or cardiomyopathy.  There is no valvular abnormality of note.  Her chest x-rays have shown resolution of multifocal pneumonia.  Her ZIO monitor performed for 13 days showed rare ventricular and supraventricular arrhythmia in the triggered and diary events were predominantly sinus tachycardia.   She was last seen 05/07/2019. Compliance with diet, lifestyle and medications: *** Past Medical History:  Diagnosis Date  . Arthritis    OA in hands, hip and feet  . Chronic sinusitis   . Complication of anesthesia   . Depression   . GERD (gastroesophageal reflux disease)   . Joint pain   . Migraine   . PONV (postoperative nausea and vomiting)     Past Surgical History:  Procedure Laterality Date  . ABDOMINAL HYSTERECTOMY    . ANKLE RECONSTRUCTION Right 07/20/2015   Procedure: OPEN EXPLORATION OF LATERAL RIGHT PERONEUS BREVIS TENDON WITH REPAIR ;  Surgeon: Garald Balding, MD;  Location: [place];  Service: Orthopedics;  Laterality: Right;  . CESAREAN SECTION     x 2  . CHOLECYSTECTOMY  2012   lap choli  . MASS EXCISION  06/03/2011   Procedure: EXCISION MASS;  Surgeon: Schuyler Amor, MD;  Location: [place];  Service: Orthopedics;  Laterality: Right;  EXCISION MASS RIGHT MIDDLE FINGER   . NASAL SINUS SURGERY  2007, 2010    Current Medications: No outpatient medications have been marked as taking for the 08/06/19 encounter (Appointment) with Richardo Priest, MD.     Allergies:   Aspirin, Ibuprofen, Metoclopramide hcl, Sulfa antibiotics, Triptans, Zithromax [azithromycin dihydrate], and Dilaudid [hydromorphone hcl]   Social History   Socioeconomic History  . Marital status: Married    Spouse name: Clair Gulling  . Number of children: 3  . Years of education: Not on file  . Highest education level: Master's degree (e.g., MA, MS, MEng, MEd, MSW, MBA)  Occupational History  . Occupation: Herbalist: [employer]  Tobacco Use  . Smoking status: Former Smoker    Packs/day: 0.25    Years: 5.00    Pack years: 1.25    Types: Cigarettes    Quit date: 05/30/1989    Years since quitting: 30.2  . Smokeless tobacco: Never Used  . Tobacco comment: social smoker per pt  Vaping Use  . Vaping Use: Never used  Substance and Sexual Activity  . Alcohol use: Yes  Comment: occasionally  . Drug use: No  . Sexual activity: Yes  Other Topics Concern  . Not on file  Social History Narrative   Married, lives with husband. Drinks 2 cups of coffee a day, an occasional soda. Exercises 3 X a week.   Social Determinants of Health   Financial Resource Strain:   . Difficulty of Paying Living Expenses:   Food Insecurity:   . Worried About Charity fundraiser in the Last Year:   . Arboriculturist in the Last Year:   Transportation Needs:   . Film/video editor (Medical):   [name] Kitchen Lack of Transportation  (Non-Medical):   Physical Activity:   . Days of Exercise per Week:   . Minutes of Exercise per Session:   Stress:   . Feeling of Stress :   Social Connections:   . Frequency of Communication with Friends and Family:   . Frequency of Social Gatherings with Friends and Family:   . Attends Religious Services:   . Active Member of Clubs or Organizations:   . Attends Archivist Meetings:   [name] Kitchen Marital Status:      Family History: The patient's ***family history includes Asthma in her son; Diabetes in her brother, father, maternal grandfather, mother, and paternal grandfather; GER disease in her son; Heart attack in her father, maternal grandfather, and paternal grandfather; High blood pressure in her brother; Hypertension in her father, maternal grandfather, and paternal grandfather; Migraines in her daughter; Rheum arthritis in her daughter and father. ROS:   Please see the history of present illness.    All other systems reviewed and are negative.  EKGs/Labs/Other Studies Reviewed:    The following studies were reviewed today:  EKG:  EKG ordered today and personally reviewed.  The ekg ordered today demonstrates ***  Recent Labs: 04/05/2019: BUN 18; Creatinine, Ser 1.08; Hemoglobin 13.8; Platelets 274; Potassium 3.9; Sodium 141  Recent Lipid Panel No results found for: CHOL, TRIG, HDL, CHOLHDL, VLDL, LDLCALC, LDLDIRECT  Physical Exam:    VS:  LMP  (LMP Unknown)     Wt Readings from Last 3 Encounters:  07/13/19 221 lb 9.6 oz (100.5 kg)  05/07/19 216 lb (98 kg)  04/06/19 215 lb 1.3 oz (97.6 kg)     GEN: *** Well nourished, well developed in no acute distress HEENT: Normal NECK: No JVD; No carotid bruits LYMPHATICS: No lymphadenopathy CARDIAC: ***RRR, no murmurs, rubs, gallops RESPIRATORY:  Clear to auscultation without rales, wheezing or rhonchi  ABDOMEN: Soft, non-tender, non-distended MUSCULOSKELETAL:  No edema; No deformity  SKIN: Warm and dry NEUROLOGIC:  Alert  and oriented x 3 PSYCHIATRIC:  Normal affect    Signed, Shirlee More, MD  08/05/2019 1:25 PM     [facility]

## 2019-08-06 ENCOUNTER — Ambulatory Visit: Payer: BC Managed Care – PPO | Admitting: Cardiology

## 2019-08-11 ENCOUNTER — Ambulatory Visit: Payer: Self-pay | Admitting: Rheumatology

## 2019-08-18 ENCOUNTER — Other Ambulatory Visit: Payer: Self-pay | Admitting: Neurology

## 2019-08-27 DIAGNOSIS — M7062 Trochanteric bursitis, left hip: Secondary | ICD-10-CM | POA: Diagnosis not present

## 2019-08-27 DIAGNOSIS — M797 Fibromyalgia: Secondary | ICD-10-CM | POA: Diagnosis not present

## 2019-08-27 DIAGNOSIS — M7061 Trochanteric bursitis, right hip: Secondary | ICD-10-CM | POA: Diagnosis not present

## 2019-09-02 DIAGNOSIS — M7062 Trochanteric bursitis, left hip: Secondary | ICD-10-CM | POA: Diagnosis not present

## 2019-09-02 DIAGNOSIS — M797 Fibromyalgia: Secondary | ICD-10-CM | POA: Diagnosis not present

## 2019-09-02 DIAGNOSIS — M7061 Trochanteric bursitis, right hip: Secondary | ICD-10-CM | POA: Diagnosis not present

## 2019-09-04 NOTE — Progress Notes (Signed)
Cardiology Office Note:    Date:  09/06/2019   ID:  Jody Blackwell, DOB 01/31/1966, MRN [id number]  PCP:  Lennie Odor, PA  Cardiologist:  Shirlee More, MD    Referring MD: Lennie Odor, PA    ASSESSMENT:    1. Hypotension, unspecified hypotension type   2. Palpitations   3. Costochondral chest pain    PLAN:    In order of problems listed above:  1. Improved continue midodrine readdress in 6 months and I told her if she wanted to in the interim she can reduce to once daily 2. Improved I do not think she requires rate suppressing medication or repeated evaluation at this time 3. Resolved with nonsteroidal anti-inflammatory drug   Next appointment: 6 months   Medication Adjustments/Labs and Tests Ordered: Current medicines are reviewed at length with the patient today.  Concerns regarding medicines are outlined above.  No orders of the defined types were placed in this encounter.  No orders of the defined types were placed in this encounter.   Chief Complaint  Patient presents with  . Follow-up  . Hypotension    History of Present Illness:    Jody Blackwell is a 54 y.o. female with a hx of pulmonary AV malformation patent foramen ovale palpitation and costochondral chest pain.  She had COVID-19 pneumonia in October 2020 and subsequent lightheadedness and hypotension.  She was Last seen 05/07/2019 and started on midodrine.  Cardiac evaluation including echocardiogram 04/27/2019 was normal and a ambulatory 2-week heart rhythm monitor showed rare ventricular and supraventricular arrhythmia and triggered and diary events were associated with sinus tachycardia.. Compliance with diet, lifestyle and medications: Yes  She clearly is done well with midodrine has had no episodes of hypotension we discussed withdrawal and she does not want to do it at this time especially as she is having worsening symptoms related to fibromyalgia.  Her palpitation is markedly improved  and she has had no severe episodes.  No chest pain edema shortness of breath.  Home blood pressures do not exceed 1 37-8 40 systolic or 90 diastolic. Past Medical History:  Diagnosis Date  . Arthritis    OA in hands, hip and feet  . Chronic sinusitis   . Complication of anesthesia   . Depression   . GERD (gastroesophageal reflux disease)   . Joint pain   . Migraine   . PONV (postoperative nausea and vomiting)     Past Surgical History:  Procedure Laterality Date  . ABDOMINAL HYSTERECTOMY    . ANKLE RECONSTRUCTION Right 07/20/2015   Procedure: OPEN EXPLORATION OF LATERAL RIGHT PERONEUS BREVIS TENDON WITH REPAIR ;  Surgeon: Garald Balding, MD;  Location: [facility];  Service: Orthopedics;  Laterality: Right;  . CESAREAN SECTION     x 2  . CHOLECYSTECTOMY  2012   lap choli  . MASS EXCISION  06/03/2011   Procedure: EXCISION MASS;  Surgeon: Schuyler Amor, MD;  Location: [facility];  Service: Orthopedics;  Laterality: Right;  EXCISION MASS RIGHT MIDDLE FINGER   . NASAL SINUS SURGERY  2007, 2010    Current Medications: Current Meds  Medication Sig  . albuterol ([name];[name]) 108 (90 Base) MCG/ACT inhaler   . baclofen ([name]) 10 MG tablet Take 1 tablet (10 mg total) by mouth 3 (three) times daily as needed for muscle spasms.  . Cholecalciferol ([name]) 2000 units CAPS Take 1 capsule by mouth daily.  [name] Kitchen co-enzyme Q-10 50 MG  capsule Take 100 mg by mouth daily.  . diclofenac (VOLTAREN) 50 MG EC tablet TAKE 1 TABLET BY MOUTH EVERY DAY AS NEEDED  . DULoxetine (CYMBALTA) 60 MG capsule Take 60 mg by mouth daily.  [name] Kitchen esomeprazole ([name]) 40 MG capsule Take 40 mg by mouth daily at 12 noon.  . famotidine (PEPCID) 40 MG tablet Take 40 mg by mouth daily.  . Galcanezumab-gnlm (EMGALITY) 120 MG/ML SOAJ Inject 120 mg into the skin every 30 (thirty) days.  . magnesium oxide ([name]) 400 MG tablet Take 400 mg by mouth daily.  . midodrine  (PROAMATINE) 5 MG tablet Take 1 tablet (5 mg total) by mouth 2 (two) times daily with a meal.  . ondansetron (ZOFRAN) 8 MG tablet Take 8 mg by mouth as needed for nausea or vomiting.  . OSPHENA 60 MG TABS Take 1 tablet by mouth 3 (three) times a week.  [name] Kitchen PARoxetine Mesylate 7.5 MG CAPS Take 1 capsule by mouth at bedtime.  . Rimegepant Sulfate (NURTEC) 75 MG TBDP Take 1 tablet by mouth daily as needed.  . topiramate ER ([name]) 200 MG CS24 sprinkle capsule TAKE ONE CAPSULE BY MOUTH AT BEDTIME  . triamcinolone cream (KENALOG) 0.1 % Apply 1 application topically as needed.     Allergies:   Aspirin, Ibuprofen, Metoclopramide hcl, Sulfa antibiotics, Triptans, Zithromax [azithromycin dihydrate], and Dilaudid [hydromorphone hcl]   Social History   Socioeconomic History  . Marital status: Married    Spouse name: Clair Gulling  . Number of children: 3  . Years of education: Not on file  . Highest education level: Master's degree (e.g., MA, MS, MEng, MEd, MSW, MBA)  Occupational History  . Occupation: Herbalist: [employer]  Tobacco Use  . Smoking status: Former Smoker    Packs/day: 0.25    Years: 5.00    Pack years: 1.25    Types: Cigarettes    Quit date: 05/30/1989    Years since quitting: 30.2  . Smokeless tobacco: Never Used  . Tobacco comment: social smoker per pt  Vaping Use  . Vaping Use: Never used  Substance and Sexual Activity  . Alcohol use: Yes    Comment: occasionally  . Drug use: No  . Sexual activity: Yes  Other Topics Concern  . Not on file  Social History Narrative   Married, lives with husband. Drinks 2 cups of coffee a day, an occasional soda. Exercises 3 X a week.   Social Determinants of Health   Financial Resource Strain:   . Difficulty of Paying Living Expenses:   Food Insecurity:   . Worried About Charity fundraiser in the Last Year:   . Arboriculturist in the Last Year:   Transportation Needs:   . Film/video editor (Medical):   [name] Kitchen Lack  of Transportation (Non-Medical):   Physical Activity:   . Days of Exercise per Week:   . Minutes of Exercise per Session:   Stress:   . Feeling of Stress :   Social Connections:   . Frequency of Communication with Friends and Family:   . Frequency of Social Gatherings with Friends and Family:   . Attends Religious Services:   . Active Member of Clubs or Organizations:   . Attends Archivist Meetings:   [name] Kitchen Marital Status:      Family History: The patient's family history includes Asthma in her son; Diabetes in her brother, father, maternal grandfather, mother, and paternal grandfather; GER disease  in her son; Heart attack in her father, maternal grandfather, and paternal grandfather; High blood pressure in her brother; Hypertension in her father, maternal grandfather, and paternal grandfather; Migraines in her daughter; Rheum arthritis in her daughter and father. ROS:   Please see the history of present illness.    All other systems reviewed and are negative.  EKGs/Labs/Other Studies Reviewed:    The following studies were reviewed today:  Recent Labs: 04/05/2019: BUN 18; Creatinine, Ser 1.08; Hemoglobin 13.8; Platelets 274; Potassium 3.9; Sodium 141  Recent Lipid Panel No results found for: CHOL, TRIG, HDL, CHOLHDL, VLDL, LDLCALC, LDLDIRECT  Physical Exam:    VS:  BP 116/78   Pulse 88   Ht 5\' 5"  (1.651 m)   Wt 226 lb 0.6 oz (102.5 kg)   LMP  (LMP Unknown)   SpO2 99%   BMI 37.62 kg/m     Wt Readings from Last 3 Encounters:  09/06/19 226 lb 0.6 oz (102.5 kg)  07/13/19 221 lb 9.6 oz (100.5 kg)  05/07/19 216 lb (98 kg)     GEN:  Well nourished, well developed in no acute distress HEENT: Normal NECK: No JVD; No carotid bruits LYMPHATICS: No lymphadenopathy CARDIAC: RRR, no murmurs, rubs, gallops RESPIRATORY:  Clear to auscultation without rales, wheezing or rhonchi  ABDOMEN: Soft, non-tender, non-distended MUSCULOSKELETAL:  No edema; No deformity  SKIN: Warm  and dry NEUROLOGIC:  Alert and oriented x 3 PSYCHIATRIC:  Normal affect    Signed, Shirlee More, MD  09/06/2019 4:15 PM    [facility]

## 2019-09-06 ENCOUNTER — Other Ambulatory Visit: Payer: Self-pay

## 2019-09-06 ENCOUNTER — Encounter: Payer: Self-pay | Admitting: Cardiology

## 2019-09-06 ENCOUNTER — Ambulatory Visit (INDEPENDENT_AMBULATORY_CARE_PROVIDER_SITE_OTHER): Payer: BC Managed Care – PPO | Admitting: Cardiology

## 2019-09-06 VITALS — BP 116/78 | HR 88 | Ht 65.0 in | Wt 226.0 lb

## 2019-09-06 DIAGNOSIS — I959 Hypotension, unspecified: Secondary | ICD-10-CM

## 2019-09-06 DIAGNOSIS — R0789 Other chest pain: Secondary | ICD-10-CM

## 2019-09-06 DIAGNOSIS — R002 Palpitations: Secondary | ICD-10-CM | POA: Diagnosis not present

## 2019-09-06 NOTE — Patient Instructions (Signed)

## 2019-09-09 DIAGNOSIS — G43909 Migraine, unspecified, not intractable, without status migrainosus: Secondary | ICD-10-CM | POA: Diagnosis not present

## 2019-09-10 ENCOUNTER — Telehealth: Payer: Self-pay | Admitting: Neurology

## 2019-09-10 MED ORDER — PREDNISONE 10 MG PO TABS: ORAL_TABLET | ORAL | 0 refills | Status: DC

## 2019-09-10 NOTE — Telephone Encounter (Signed)
Patient states she's had a migraine most of the time since Monday 09/06/2019. Did go to urgent care yesterday and got toradol/phenergan/benadryl which helped for awhile and she slept but HA returned around 10:30 AM today. Has used all of her available rescue meds. Had been doing well for quite awhile but can't break this cycle. Was treated for bronchitis/sinus infection last month with abx and prednisone. Last seen 07/2018. Told her Dr Tomi Likens will be informed of situation and will call her with update, verbalized understanding.

## 2019-09-10 NOTE — Telephone Encounter (Signed)
Patient is having a migraine since Monday, she has done all of the things that she should and it has not help she went to the urgent care and got a shot yesterday. Nothing is helping. Please call

## 2019-09-10 NOTE — Telephone Encounter (Signed)
We can prescribe her a prednisone taper 10mg  tablet:  60mg  on day 1, then 50mg  on day 2, then 40mg  on day 3, then 30mg  on day 4, then 20mg  on day 5, then 10mg  on day 6, then STOP (quantity 21, refills 0).  All patients must be seen once a year in order to continue prescribing refills.  She must make a follow up appointment or going forward I will no longer be able to prescribe any medications.

## 2019-09-10 NOTE — Telephone Encounter (Signed)
Notified pt that, per Dr Tomi Likens - We can prescribe her a prednisone taper 10mg  tablet:  60mg  on day 1, then 50mg  on day 2, then 40mg  on day 3, then 30mg  on day 4, then 20mg  on day 5, then 10mg  on day 6, then STOP.  All patients must be seen once a year in order to continue prescribing refills.  She must make a follow up appointment or going forward I will no longer be able to prescribe any medications.  Pt verbalized understanding and transferred her to scheduling to set up f/u appt.

## 2019-09-17 DIAGNOSIS — M7062 Trochanteric bursitis, left hip: Secondary | ICD-10-CM | POA: Diagnosis not present

## 2019-09-17 DIAGNOSIS — M7061 Trochanteric bursitis, right hip: Secondary | ICD-10-CM | POA: Diagnosis not present

## 2019-09-17 DIAGNOSIS — M797 Fibromyalgia: Secondary | ICD-10-CM | POA: Diagnosis not present

## 2019-09-18 ENCOUNTER — Other Ambulatory Visit: Payer: Self-pay | Admitting: Neurology

## 2019-09-22 ENCOUNTER — Telehealth: Payer: Self-pay | Admitting: Neurology

## 2019-09-22 DIAGNOSIS — M7061 Trochanteric bursitis, right hip: Secondary | ICD-10-CM | POA: Diagnosis not present

## 2019-09-22 DIAGNOSIS — M797 Fibromyalgia: Secondary | ICD-10-CM | POA: Diagnosis not present

## 2019-09-22 DIAGNOSIS — M7062 Trochanteric bursitis, left hip: Secondary | ICD-10-CM | POA: Diagnosis not present

## 2019-09-22 NOTE — Telephone Encounter (Signed)
LMOVM

## 2019-09-22 NOTE — Telephone Encounter (Signed)
At this point, I do not have anything that is going to abort the current headache immediately.  I believe she is on the topiramate (already high dose) and Emgality.  As we discussed last year, I would recommend stopping Emgality and starting Botox.

## 2019-09-22 NOTE — Telephone Encounter (Signed)
Pt advised of Dr. Tomi Likens note. Pt advised to see her PCP or go the ED if the symptoms get worse. Pt advised she can start Botox as discussed at her last visit.

## 2019-09-22 NOTE — Telephone Encounter (Signed)
Patient called in and spoke with the Access Nurse. She stated "she has had a migraine headache for the past 4 weeks, she has spoken to Dr. Tomi Likens and just finished prednisone and still has the headache as well as today she started having the aura again"

## 2019-09-25 ENCOUNTER — Encounter (HOSPITAL_BASED_OUTPATIENT_CLINIC_OR_DEPARTMENT_OTHER): Payer: Self-pay | Admitting: Emergency Medicine

## 2019-09-25 ENCOUNTER — Other Ambulatory Visit: Payer: Self-pay

## 2019-09-25 ENCOUNTER — Emergency Department (HOSPITAL_BASED_OUTPATIENT_CLINIC_OR_DEPARTMENT_OTHER)
Admission: EM | Admit: 2019-09-25 | Discharge: 2019-09-25 | Disposition: A | Discharge status: Home / Self Care | Payer: BC Managed Care – PPO | Attending: Emergency Medicine | Admitting: Emergency Medicine

## 2019-09-25 DIAGNOSIS — Z87891 Personal history of nicotine dependence: Secondary | ICD-10-CM | POA: Insufficient documentation

## 2019-09-25 DIAGNOSIS — G43901 Migraine, unspecified, not intractable, with status migrainosus: Secondary | ICD-10-CM | POA: Diagnosis not present

## 2019-09-25 DIAGNOSIS — Z7951 Long term (current) use of inhaled steroids: Secondary | ICD-10-CM | POA: Insufficient documentation

## 2019-09-25 DIAGNOSIS — Z79899 Other long term (current) drug therapy: Secondary | ICD-10-CM | POA: Diagnosis not present

## 2019-09-25 DIAGNOSIS — G43909 Migraine, unspecified, not intractable, without status migrainosus: Secondary | ICD-10-CM | POA: Diagnosis not present

## 2019-09-25 MED ORDER — DEXAMETHASONE SODIUM PHOSPHATE 10 MG/ML IJ SOLN
10.0000 mg | Freq: Once | INTRAMUSCULAR | Status: AC
  Administered 2019-09-25: 10 mg via INTRAVENOUS
  Filled 2019-09-25: qty 1

## 2019-09-25 MED ORDER — DIPHENHYDRAMINE HCL 50 MG/ML IJ SOLN
25.0000 mg | Freq: Once | INTRAMUSCULAR | Status: AC
  Administered 2019-09-25: 25 mg via INTRAVENOUS
  Filled 2019-09-25: qty 1

## 2019-09-25 MED ORDER — PROCHLORPERAZINE EDISYLATE 10 MG/2ML IJ SOLN
10.0000 mg | Freq: Once | INTRAMUSCULAR | Status: AC
  Administered 2019-09-25: 10 mg via INTRAVENOUS
  Filled 2019-09-25: qty 2

## 2019-09-25 MED ORDER — MAGNESIUM SULFATE 2 GM/50ML IV SOLN
2.0000 g | Freq: Once | INTRAVENOUS | Status: AC
  Administered 2019-09-25: 2 g via INTRAVENOUS
  Filled 2019-09-25: qty 50

## 2019-09-25 MED ORDER — LACTATED RINGERS IV BOLUS
1000.0000 mL | Freq: Once | INTRAVENOUS | Status: AC
  Administered 2019-09-25: 1000 mL via INTRAVENOUS

## 2019-09-25 MED ORDER — KETOROLAC TROMETHAMINE 30 MG/ML IJ SOLN
30.0000 mg | Freq: Once | INTRAMUSCULAR | Status: AC
  Administered 2019-09-25: 30 mg via INTRAVENOUS
  Filled 2019-09-25: qty 1

## 2019-09-25 NOTE — ED Provider Notes (Signed)
I received this patient in signout from Dr. Johnney Killian.  Briefly, patient has history of migraine headaches and has had several weeks of persistent headache not responsive to home therapies.  At time of signout, patient had received several migraine medicines and continue to have pain, was given magnesium and Decadron.  Awaiting clinical improvement.  On reassessment, the patient is awake, alert, and comfortable.  She states that her headache is not completely resolved but is much better.  She does have a neurologist with whom she follows.  I encouraged her to schedule an outpatient appointment and discuss supportive measures.  Reviewed return precautions and she voiced understanding.   Jody Blackwell, Jody Overland, MD 09/25/19 [phone]

## 2019-09-25 NOTE — ED Triage Notes (Signed)
Headache x 4 weeks. Has been seen several times without relief.

## 2019-09-25 NOTE — ED Provider Notes (Signed)
[facility] EMERGENCY DEPARTMENT Provider Note   CSN: [id number] Arrival date & time: 09/25/19  0935     History Chief Complaint  Patient presents with  . Headache    Jody Blackwell is a 54 y.o. female.  HPI Patient reports has history of migraine headaches.  She reports she has had this headache for about 4 weeks and just has not been able to get it to completely resolve.  She has seen her neurologist and has been tried on a course of prednisone as well as her regular migraine medications and rescue therapies.  She reports she did get a migraine cocktail and it was temporarily helpful but this headache keeps coming back.  She reports it is a typical pattern for her.  She gets posterior right-sided headaches.  Positive light sensitivity.  No fevers or chills.  No dental pain.  No weakness numbness or tingling.  She reports also she seems to be having a flare of fibromyalgia.  She has been experiencing generalized pain but not atypical for what she experiences of fibromyalgia.    Past Medical History:  Diagnosis Date  . Arthritis    OA in hands, hip and feet  . Chronic sinusitis   . Complication of anesthesia   . Depression   . GERD (gastroesophageal reflux disease)   . Joint pain   . Migraine   . PONV (postoperative nausea and vomiting)     Patient Active Problem List   Diagnosis Date Noted  . COVID-19 virus infection 04/06/2019  . Peroneal tendon tear 07/20/2015  . Left ankle pain 08/03/2010  . Left shoulder pain 06/29/2010    Past Surgical History:  Procedure Laterality Date  . ABDOMINAL HYSTERECTOMY    . ANKLE RECONSTRUCTION Right 07/20/2015   Procedure: OPEN EXPLORATION OF LATERAL RIGHT PERONEUS BREVIS TENDON WITH REPAIR ;  Surgeon: Garald Balding, MD;  Location: [facility];  Service: Orthopedics;  Laterality: Right;  . CESAREAN SECTION     x 2  . CHOLECYSTECTOMY  2012   lap choli  . MASS EXCISION  06/03/2011   Procedure: EXCISION  MASS;  Surgeon: Schuyler Amor, MD;  Location: [facility];  Service: Orthopedics;  Laterality: Right;  EXCISION MASS RIGHT MIDDLE FINGER   . NASAL SINUS SURGERY  2007, 2010     OB History   No obstetric history on file.     Family History  Problem Relation Age of Onset  . Diabetes Mother   . Diabetes Father   . Heart attack Father   . Hypertension Father   . Rheum arthritis Father   . Diabetes Maternal Grandfather   . Heart attack Maternal Grandfather   . Hypertension Maternal Grandfather   . Diabetes Paternal Grandfather   . Heart attack Paternal Grandfather   . Hypertension Paternal Grandfather   . High blood pressure Brother   . Diabetes Brother   . Asthma Son   . GER disease Son   . Rheum arthritis Daughter   . Migraines Daughter     Social History   Tobacco Use  . Smoking status: Former Smoker    Packs/day: 0.25    Years: 5.00    Pack years: 1.25    Types: Cigarettes    Quit date: 05/30/1989    Years since quitting: 30.3  . Smokeless tobacco: Never Used  . Tobacco comment: social smoker per pt  Vaping Use  . Vaping Use: Never used  Substance Use Topics  .  Alcohol use: Yes    Comment: occasionally  . Drug use: No    Home Medications Prior to Admission medications   Medication Sig Start Date End Date Taking? Authorizing Provider  albuterol ([name];[name]) 108 (90 Base) MCG/ACT inhaler     [provider]  baclofen ([name]) 10 MG tablet Take 1 tablet (10 mg total) by mouth 3 (three) times daily as needed for muscle spasms. 04/14/18   Pieter Partridge, DO  Cholecalciferol ([name]) 2000 units CAPS Take 1 capsule by mouth daily.    [provider]  co-enzyme Q-10 50 MG capsule Take 100 mg by mouth daily.    [provider]  diclofenac (VOLTAREN) 50 MG EC tablet TAKE 1 TABLET BY MOUTH EVERY DAY AS NEEDED 04/13/19   Pieter Partridge, DO  DULoxetine (CYMBALTA) 60 MG capsule Take 60 mg by mouth daily. 03/17/19    [provider]  esomeprazole ([name]) 40 MG capsule Take 40 mg by mouth daily at 12 noon.    [provider]  famotidine (PEPCID) 40 MG tablet Take 40 mg by mouth daily. 03/23/19   [provider]  Galcanezumab-gnlm (EMGALITY) 120 MG/ML SOAJ Inject 120 mg into the skin every 30 (thirty) days. 03/15/19   Pieter Partridge, DO  magnesium oxide ([name]) 400 MG tablet Take 400 mg by mouth daily.    [provider]  midodrine (PROAMATINE) 5 MG tablet Take 1 tablet (5 mg total) by mouth 2 (two) times daily with a meal. 05/07/19   Munley, Hilton Cork, MD  ondansetron (ZOFRAN) 8 MG tablet Take 8 mg by mouth as needed for nausea or vomiting.    [provider]  OSPHENA 60 MG TABS Take 1 tablet by mouth 3 (three) times a week. 03/22/19   [provider]  PARoxetine Mesylate 7.5 MG CAPS Take 1 capsule by mouth at bedtime. 03/31/19   [provider]  predniSONE (DELTASONE) 10 MG tablet Take 60mg  day 1, 50mg  day 2, 40mg  day 3, 30mg  day 4, 20mg  day 5, 10mg  day 6 then stop 09/10/19   Tomi Likens, Adam R, DO  Rimegepant Sulfate (NURTEC) 75 MG TBDP Take 1 tablet by mouth daily as needed. 12/14/18   Pieter Partridge, DO  topiramate ER ([name]) 200 MG CS24 sprinkle capsule TAKE ONE CAPSULE BY MOUTH AT BEDTIME 09/20/19   Tomi Likens, Adam R, DO  triamcinolone cream (KENALOG) 0.1 % Apply 1 application topically as needed.    [provider]    Allergies    Aspirin, Ibuprofen, Metoclopramide hcl, Sulfa antibiotics, Triptans, Zithromax [azithromycin dihydrate], and Dilaudid [hydromorphone hcl]  Review of Systems   Review of Systems 10 systems reviewed and negative except as per HPI Physical Exam Updated Vital Signs BP (!) 117/89 (BP Location: Left Arm)   Pulse 90   Temp 99 F (37.2 C) (Oral)   Resp 18   Ht 5\' 5"  (1.651 m)   Wt (!) 95.3 kg   LMP  (LMP Unknown)   SpO2 98%   BMI 34.95 kg/m   Physical Exam Constitutional:      Appearance: Normal appearance. She  is well-developed.  HENT:     Head: Normocephalic and atraumatic.     Mouth/Throat:     Mouth: Mucous membranes are moist.     Pharynx: Oropharynx is clear.  Eyes:     Pupils: Pupils are equal, round, and reactive to light.  Cardiovascular:     Rate and Rhythm: Normal rate and  regular rhythm.     Heart sounds: Normal heart sounds.  Pulmonary:     Effort: Pulmonary effort is normal.     Breath sounds: Normal breath sounds.  Abdominal:     General: Bowel sounds are normal. There is no distension.     Palpations: Abdomen is soft.     Tenderness: There is no abdominal tenderness.  Musculoskeletal:        General: Normal range of motion.     Cervical back: Neck supple.  Skin:    General: Skin is warm and dry.  Neurological:     General: No focal deficit present.     Mental Status: She is alert and oriented to person, place, and time.     GCS: GCS eye subscore is 4. GCS verbal subscore is 5. GCS motor subscore is 6.     Cranial Nerves: No cranial nerve deficit.     Sensory: No sensory deficit.     Motor: No weakness.     Coordination: Coordination normal.     Gait: Gait normal.  Psychiatric:        Mood and Affect: Mood normal.     ED Results / Procedures / Treatments   Labs (all labs ordered are listed, but only abnormal results are displayed) Labs Reviewed - No data to display  EKG None  Radiology No results found.  Procedures Procedures (including critical care time)  Medications Ordered in ED Medications - No data to display  ED Course  I have reviewed the triage vital signs and the nursing notes.  Pertinent labs & imaging results that were available during my care of the patient were reviewed by me and considered in my medical decision making (see chart for details).    MDM Rules/Calculators/A&P                          Patient has a long history of migraine headaches.  She does have multiple modalities that are used at home.  She also sees neurology who  manages headaches.  Headache has been very prolonged.  Patient is however in good condition.  She is nontoxic and alert.  Well in appearance.  Normal neurologic exam without any neurologic deficit.  No signs of infectious etiology.  Will treat with migraine cocktail with fluids, Toradol, Compazine and Benadryl.  (15: 30) reassessment: Patient reports headache is mildly improved but still persists.  Will add magnesium 2 g and Decadron 10 mg IV.  Dr. Rex Kras to reassess for response to treatment. Final Clinical Impression(s) / ED Diagnoses Final diagnoses:  Migraine with status migrainosus, not intractable, unspecified migraine type    Rx / DC Orders ED Discharge Orders    None       Charlesetta Shanks, MD 09/25/19 1546

## 2019-09-29 DIAGNOSIS — M7061 Trochanteric bursitis, right hip: Secondary | ICD-10-CM | POA: Diagnosis not present

## 2019-09-29 DIAGNOSIS — M797 Fibromyalgia: Secondary | ICD-10-CM | POA: Diagnosis not present

## 2019-09-29 DIAGNOSIS — M7062 Trochanteric bursitis, left hip: Secondary | ICD-10-CM | POA: Diagnosis not present

## 2019-10-13 DIAGNOSIS — M7061 Trochanteric bursitis, right hip: Secondary | ICD-10-CM | POA: Diagnosis not present

## 2019-10-13 DIAGNOSIS — M7062 Trochanteric bursitis, left hip: Secondary | ICD-10-CM | POA: Diagnosis not present

## 2019-10-13 DIAGNOSIS — M797 Fibromyalgia: Secondary | ICD-10-CM | POA: Diagnosis not present

## 2019-10-22 DIAGNOSIS — E669 Obesity, unspecified: Secondary | ICD-10-CM | POA: Diagnosis not present

## 2019-10-22 DIAGNOSIS — Z01411 Encounter for gynecological examination (general) (routine) with abnormal findings: Secondary | ICD-10-CM | POA: Diagnosis not present

## 2019-10-22 DIAGNOSIS — Z13220 Encounter for screening for lipoid disorders: Secondary | ICD-10-CM | POA: Diagnosis not present

## 2019-10-23 ENCOUNTER — Other Ambulatory Visit: Payer: Self-pay | Admitting: Neurology

## 2019-10-27 DIAGNOSIS — M7062 Trochanteric bursitis, left hip: Secondary | ICD-10-CM | POA: Diagnosis not present

## 2019-10-27 DIAGNOSIS — M7061 Trochanteric bursitis, right hip: Secondary | ICD-10-CM | POA: Diagnosis not present

## 2019-10-27 DIAGNOSIS — M797 Fibromyalgia: Secondary | ICD-10-CM | POA: Diagnosis not present

## 2019-10-28 ENCOUNTER — Encounter: Payer: Self-pay | Admitting: Neurology

## 2019-10-28 NOTE — Progress Notes (Addendum)
Received VM from [facility] letting me know they faxed over the PA form for Emgality medication to [place]. I received the fax. I filled it out and faxed it back. Awaiting PA determination.   Received fax with approval valid from 11/01/19 to 12/01/19.

## 2019-11-02 ENCOUNTER — Other Ambulatory Visit: Payer: Self-pay

## 2019-11-02 MED ORDER — MIDODRINE HCL 5 MG PO TABS: 5.0000 mg | ORAL_TABLET | Freq: Two times a day (BID) | ORAL | 3 refills | Status: DC

## 2019-11-03 DIAGNOSIS — M797 Fibromyalgia: Secondary | ICD-10-CM | POA: Diagnosis not present

## 2019-11-03 DIAGNOSIS — M7062 Trochanteric bursitis, left hip: Secondary | ICD-10-CM | POA: Diagnosis not present

## 2019-11-03 DIAGNOSIS — M7061 Trochanteric bursitis, right hip: Secondary | ICD-10-CM | POA: Diagnosis not present

## 2019-11-10 NOTE — Progress Notes (Signed)
Virtual Visit via Video Note The purpose of this virtual visit is to provide medical care while limiting exposure to the novel coronavirus.    Consent was obtained for video visit:  Yes.   Answered questions that patient had about telehealth interaction:  Yes.   I discussed the limitations, risks, security and privacy concerns of performing an evaluation and management service by telemedicine. I also discussed with the patient that there may be a patient responsible charge related to this service. The patient expressed understanding and agreed to proceed.  Pt location: Home Physician Location: office Name of referring provider:  Lennie Blackwell, [place] I connected with Jody Blackwell at patients initiation/request on 11/11/2019 at  8:30 AM EDT by video enabled telemedicine application and verified that I am speaking with the correct person using two identifiers. Pt MRN:  [id number] Pt DOB:  [birth year] Video Participants:  Jody Blackwell   History of Present Illness:  Jody Blackwell is a80 year old woman with chronic pain syndrome, depression and GERD who follows up for migraines.  UPDATE: Last seen in June 2020.  At that time, it appeared Emgality wasn't effective so plan was to change to Botox.  However, by the end of that summer, her migraines improved and she deferred starting Botox and continue Emgality and topiramate.  She later endorsed side effects to [facility], so we switched to Nurtec.  She started having increased migraines following a bout of COVID-related pneumonia and tachycardia.  Over this past year, she has had increased intractable migraines.  She had a migraine all of July.  She was seen in the ED on 09/25/2019 for intractable migraine that was treated with magnesium and Decadron.  Since then, she has had 1 to 2 migraines a week (3 or 4 severe lasting a couple of days, otherwise last 4 hours).    Rescue: Zembrace and Cambia Frequency of abortive medication:15-16  days a month. Mini-prophylaxis:  none Current NSAIDS:diclofenac Current analgesics:None Current triptans:None Current ergotamine:none Current anti-emetic:Zofran 8 mg Current muscle relaxants:Baclofen Current anti-anxiolytic:None Current sleep aide:None Current Antihypertensive medications:None Current Antidepressant medications:Lexapro, Cymbalta 60mg  Current Anticonvulsant medications:[name]200 mg at bedtime Current anti-CGRP:Emgality, Nurtec Current Vitamins/Herbal/Supplements:Magnesium 400 mg, D Current Antihistamines/Decongestants:None Other therapy:None Hormone:none  Caffeine:2 cups coffee daily Diet:Hydrates. No soda. Exercise:Gym 3 days a week Depression:No; Anxiety:No. History of depression. Other pain:No Sleep hygiene:Good except for hot flashes.  HISTORY: Onset:  Her early 56s Location: right occipital Quality: pounding, like head is going to explode Initial Intensity: Severe prior to [place]. Now moderate and sometimes severe. Aura: Severe headache preceded by left tunnel vision for 15-20 minutes Prodrome: Generalized aches, tired Postdrome: Tired, foggy, washed out Associated symptoms:  Nausea, photophobia, phonophobia, osmophobia, blurred vision. Left facial numbness when severe. No unilateral weakness. Initial Duration: Prior to [name], 2 days up to 2 weeks. Now 4 hours with sumatriptan 50mg . Initial Frequency: Prior to [name], 20+ days per month. Now 10 days per month (worse the week prior to next injection). Initial Frequency of abortive medication: as needed Triggers:  Artificial sweeteners, tea, raw onions, perfumes/lotions, skipped meals, decreased sleep, change in weather Relieving factors:  Rest Activity: Aggravates. Cannot function about 2 to 3 days a month.  Past NSAIDS: ibuprofen, naproxen "min-prophylaxis" 2 weeks prior to next Emgality shot, diclofenac 50mg , Cambia, naproxen Past analgesics:  Tylenol Past abortive triptans: Sumatriptan 50 mg (ineffective),Sumatriptan patch (effective),[name]/[name] (rapid heartbeat, flushing), [name] 5mg  NS (ineffective) Past ergotamine: Migranal (nose bleeds), Zembrace SymTouch (side effects) Past muscle relaxants: no Past anti-emetic:  no Past antihypertensive medications: propranolol (initially effective for many years), verapamil Past antidepressant medications: Nortriptyline, amitriptyline, escitalopram Past anticonvulsant medications: gabapentin Past anti--CGRP: [name] Past vitamins/Herbal/Supplements: no Past antihistamines/decongestants: no Other past therapies: Trigger point injections/nerve blocks  Family history of headache: Mother, maternal grandmother  03/22/15: Sed Rate 3  Past Medical History: Past Medical History:  Diagnosis Date  . Arthritis    OA in hands, hip and feet  . Chronic sinusitis   . Complication of anesthesia   . Depression   . GERD (gastroesophageal reflux disease)   . Joint pain   . Migraine   . PONV (postoperative nausea and vomiting)     Medications: Outpatient Encounter Medications as of 11/11/2019  Medication Sig  . albuterol ([name];[name]) 108 (90 Base) MCG/ACT inhaler   . baclofen ([name]) 10 MG tablet Take 1 tablet (10 mg total) by mouth 3 (three) times daily as needed for muscle spasms.  . Cholecalciferol ([name]) 2000 units CAPS Take 1 capsule by mouth daily.  [name] Kitchen co-enzyme Q-10 50 MG capsule Take 100 mg by mouth daily.  . diclofenac (VOLTAREN) 50 MG EC tablet TAKE 1 TABLET BY MOUTH EVERY DAY AS NEEDED  . DULoxetine (CYMBALTA) 60 MG capsule Take 60 mg by mouth daily.  [name] Kitchen esomeprazole ([name]) 40 MG capsule Take 40 mg by mouth daily at 12 noon.  . famotidine (PEPCID) 40 MG tablet Take 40 mg by mouth daily.  . Galcanezumab-gnlm (EMGALITY) 120 MG/ML SOAJ Inject 120 mg into the skin every 30 (thirty) days.  . magnesium oxide ([name]) 400 MG tablet Take 400 mg by  mouth daily.  . midodrine (PROAMATINE) 5 MG tablet Take 1 tablet (5 mg total) by mouth 2 (two) times daily with a meal.  . ondansetron (ZOFRAN) 8 MG tablet Take 8 mg by mouth as needed for nausea or vomiting.  . OSPHENA 60 MG TABS Take 1 tablet by mouth 3 (three) times a week.  [name] Kitchen PARoxetine Mesylate 7.5 MG CAPS Take 1 capsule by mouth at bedtime.  . Rimegepant Sulfate (NURTEC) 75 MG TBDP Take 1 tablet by mouth daily as needed.  . triamcinolone cream (KENALOG) 0.1 % Apply 1 application topically as needed.  . [DISCONTINUED] predniSONE (DELTASONE) 10 MG tablet Take 60mg  day 1, 50mg  day 2, 40mg  day 3, 30mg  day 4, 20mg  day 5, 10mg  day 6 then stop  . topiramate ER ([name]) 200 MG CS24 sprinkle capsule TAKE ONE CAPSULE BY MOUTH AT BEDTIME (Patient not taking: Reported on 11/11/2019)   No facility-administered encounter medications on file as of 11/11/2019.    Allergies: Allergies  Allergen Reactions  . Aspirin Hives  . Ibuprofen Hives  . 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 Sulfa Antibiotics Hives  . Triptans Other (See Comments)    Sensitivity to injectable triptan drugs per pt  . Zithromax [Azithromycin Dihydrate] Hives  . Dilaudid [Hydromorphone Hcl] Palpitations    Family History: Family History  Problem Relation Age of Onset  . Diabetes Mother   . Diabetes Father   . Heart attack Father   . Hypertension Father   . Rheum arthritis Father   . Diabetes Maternal Grandfather   . Heart attack Maternal Grandfather   . Hypertension Maternal Grandfather   . Diabetes Paternal Grandfather   . Heart attack Paternal Grandfather   . Hypertension Paternal Grandfather   . High blood pressure Brother   . Diabetes Brother   . Asthma Son   .  GER disease Son   . Rheum arthritis Daughter   . Migraines Daughter     Social History: Social History   Socioeconomic History  . Marital status: Married    Spouse name: Clair Gulling  . Number of children: 3  . Years of  education: Not on file  . Highest education level: Master's degree (e.g., MA, MS, MEng, MEd, MSW, MBA)  Occupational History  . Occupation: Herbalist: [employer]  Tobacco Use  . Smoking status: Former Smoker    Packs/day: 0.25    Years: 5.00    Pack years: 1.25    Types: Cigarettes    Quit date: 05/30/1989    Years since quitting: 30.4  . Smokeless tobacco: Never Used  . Tobacco comment: social smoker per pt  Vaping Use  . Vaping Use: Never used  Substance and Sexual Activity  . Alcohol use: Yes    Comment: occasionally  . Drug use: No  . Sexual activity: Yes  Other Topics Concern  . Not on file  Social History Narrative   Married, lives with husband. Drinks 2 cups of coffee a day, an occasional soda. Exercises 3 X a week.   Social Determinants of Health   Financial Resource Strain:   . Difficulty of Paying Living Expenses: Not on file  Food Insecurity:   . Worried About Charity fundraiser in the Last Year: Not on file  . Ran Out of Food in the Last Year: Not on file  Transportation Needs:   . Lack of Transportation (Medical): Not on file  . Lack of Transportation (Non-Medical): Not on file  Physical Activity:   . Days of Exercise per Week: Not on file  . Minutes of Exercise per Session: Not on file  Stress:   . Feeling of Stress : Not on file  Social Connections:   . Frequency of Communication with Friends and Family: Not on file  . Frequency of Social Gatherings with Friends and Family: Not on file  . Attends Religious Services: Not on file  . Active Member of Clubs or Organizations: Not on file  . Attends Archivist Meetings: Not on file  . Marital Status: Not on file  Intimate Partner Violence:   . Fear of Current or Ex-Partner: Not on file  . Emotionally Abused: Not on file  . Physically Abused: Not on file  . Sexually Abused: Not on file    Observations/Objective:   There were no vitals taken for this visit. No acute  distress.  Alert and oriented.  Speech fluent and not dysarthric.  Language intact.  Eyes orthophoric on primary gaze.  Face symmetric.  Assessment and Plan:   Migraine with aura, without status migrainosus, intractable  1.  For preventative management, she will start Vyepti.  She will discontinue Emgality but remain on Qudexy 200mg  daily for now.  Once she has her first Vyepti infusion, I asked her to contact me for topiramate taper. 2.  For abortive therapy, she will pick up samples of Reyvow to take for her severe migraines (which she has a warning with tunnel vision).  Discussed that she should not drive for 8 hours after use.  She may continue Nurtec for other migraines. 3.  Limit use of pain relievers to no more than 2 days out of week to prevent risk of rebound or medication-overuse headache. 4.  Keep headache diary 5.  Exercise, hydration, caffeine cessation, sleep hygiene, monitor for and avoid triggers 6.  Will fill out her FMLA 7. Follow up 6 months.   Follow Up Instructions:    -I discussed the assessment and treatment plan with the patient. The patient was provided an opportunity to ask questions and all were answered. The patient agreed with the plan and demonstrated an understanding of the instructions.   The patient was advised to call back or seek an in-person evaluation if the symptoms worsen or if the condition fails to improve as anticipated.    Dudley Major, DO

## 2019-11-11 ENCOUNTER — Other Ambulatory Visit: Payer: Self-pay

## 2019-11-11 ENCOUNTER — Telehealth (INDEPENDENT_AMBULATORY_CARE_PROVIDER_SITE_OTHER): Payer: BC Managed Care – PPO | Admitting: Neurology

## 2019-11-11 ENCOUNTER — Encounter: Payer: Self-pay | Admitting: Neurology

## 2019-11-11 DIAGNOSIS — Z0279 Encounter for issue of other medical certificate: Secondary | ICD-10-CM

## 2019-11-11 DIAGNOSIS — G43119 Migraine with aura, intractable, without status migrainosus: Secondary | ICD-10-CM

## 2019-11-11 MED ORDER — TOPIRAMATE ER 200 MG PO SPRINKLE CAP24: 200.0000 mg | EXTENDED_RELEASE_CAPSULE | Freq: Every day | ORAL | 2 refills | Status: DC

## 2019-11-12 NOTE — Progress Notes (Signed)
[employer] forms for Vyepti filled out and faxed over.

## 2019-11-15 ENCOUNTER — Encounter: Payer: Self-pay | Admitting: Neurology

## 2019-11-15 NOTE — Progress Notes (Addendum)
Oksana DANA Everetts KeyShanda Bumps - PA Case ID: [id number] Need help? Call us at [phone] Outcome Approvedtoday Approved for Topiramate Capsule ER 24HR Sprinkle 200 MG, quantity 30 capsule per 30 days Drug Topiramate ER 200MG  er sprinkle capsules Form Forensic psychologist Prior Authorization Form (Envolve) (CB) 2017 NCPDP

## 2019-11-17 DIAGNOSIS — M7061 Trochanteric bursitis, right hip: Secondary | ICD-10-CM | POA: Diagnosis not present

## 2019-11-17 DIAGNOSIS — M797 Fibromyalgia: Secondary | ICD-10-CM | POA: Diagnosis not present

## 2019-11-17 DIAGNOSIS — M7062 Trochanteric bursitis, left hip: Secondary | ICD-10-CM | POA: Diagnosis not present

## 2019-11-19 DIAGNOSIS — E7800 Pure hypercholesterolemia, unspecified: Secondary | ICD-10-CM | POA: Diagnosis not present

## 2019-11-19 DIAGNOSIS — N1831 Chronic kidney disease, stage 3a: Secondary | ICD-10-CM | POA: Diagnosis not present

## 2019-11-22 NOTE — Progress Notes (Signed)
Emgality washout 30 days from last injection. Advised rep [place] ext [phone]

## 2019-11-24 DIAGNOSIS — M7061 Trochanteric bursitis, right hip: Secondary | ICD-10-CM | POA: Diagnosis not present

## 2019-11-24 DIAGNOSIS — M797 Fibromyalgia: Secondary | ICD-10-CM | POA: Diagnosis not present

## 2019-11-24 DIAGNOSIS — M7062 Trochanteric bursitis, left hip: Secondary | ICD-10-CM | POA: Diagnosis not present

## 2019-11-26 ENCOUNTER — Other Ambulatory Visit: Payer: Self-pay | Admitting: Physician Assistant

## 2019-11-26 DIAGNOSIS — Z0000 Encounter for general adult medical examination without abnormal findings: Secondary | ICD-10-CM

## 2019-11-30 ENCOUNTER — Telehealth: Payer: Self-pay | Admitting: Neurology

## 2019-11-30 ENCOUNTER — Ambulatory Visit (INDEPENDENT_AMBULATORY_CARE_PROVIDER_SITE_OTHER): Payer: BC Managed Care – PPO

## 2019-11-30 ENCOUNTER — Other Ambulatory Visit: Payer: Self-pay

## 2019-11-30 DIAGNOSIS — G43709 Chronic migraine without aura, not intractable, without status migrainosus: Secondary | ICD-10-CM

## 2019-11-30 MED ORDER — DIPHENHYDRAMINE HCL 50 MG/ML IJ SOLN
25.0000 mg | Freq: Once | INTRAMUSCULAR | Status: AC
  Administered 2019-11-30: 25 mg via INTRAMUSCULAR

## 2019-11-30 MED ORDER — KETOROLAC TROMETHAMINE 60 MG/2ML IM SOLN
60.0000 mg | Freq: Once | INTRAMUSCULAR | Status: AC
  Administered 2019-11-30: 60 mg via INTRAMUSCULAR

## 2019-11-30 NOTE — Telephone Encounter (Signed)
Pt advised.

## 2019-11-30 NOTE — Telephone Encounter (Signed)
Yes, as long as she has a driver.  Otherwise, we can only give her the Toradol injection.

## 2019-11-30 NOTE — Telephone Encounter (Signed)
Patient has a headache since last week and she has taken her medication and it is not working she would like to know if she can come in for a injection  Please call

## 2019-12-01 ENCOUNTER — Encounter: Payer: Self-pay | Admitting: Neurology

## 2019-12-01 ENCOUNTER — Emergency Department
Admission: EM | Admit: 2019-12-01 | Discharge: 2019-12-01 | Disposition: A | Discharge status: Home / Self Care | Payer: BC Managed Care – PPO | Source: Home / Self Care

## 2019-12-01 ENCOUNTER — Other Ambulatory Visit: Payer: Self-pay

## 2019-12-01 DIAGNOSIS — R519 Headache, unspecified: Secondary | ICD-10-CM

## 2019-12-01 DIAGNOSIS — G43901 Migraine, unspecified, not intractable, with status migrainosus: Secondary | ICD-10-CM

## 2019-12-01 MED ORDER — DIPHENHYDRAMINE HCL 50 MG/ML IJ SOLN
25.0000 mg | Freq: Once | INTRAMUSCULAR | Status: AC
  Administered 2019-12-01: 25 mg via INTRAMUSCULAR

## 2019-12-01 MED ORDER — DEXAMETHASONE SODIUM PHOSPHATE 10 MG/ML IJ SOLN
10.0000 mg | Freq: Once | INTRAMUSCULAR | Status: AC
  Administered 2019-12-01: 10 mg via INTRAMUSCULAR

## 2019-12-01 MED ORDER — KETOROLAC TROMETHAMINE 60 MG/2ML IM SOLN
60.0000 mg | Freq: Once | INTRAMUSCULAR | Status: AC
  Administered 2019-12-01: 60 mg via INTRAMUSCULAR

## 2019-12-01 NOTE — Discharge Instructions (Addendum)
If headache pain doesn't subside, go to the Emergency Department.

## 2019-12-01 NOTE — ED Provider Notes (Signed)
[facility]    CSN: [id number] Arrival date & time: 12/01/19  1320      History   Chief Complaint Chief Complaint  Patient presents with  . Migraine    HPI Jody Blackwell is a 54 y.o. female.   HPI  Patient presents with a history of chronic migrainous type headaches for evaluation of a headache which is persisted over the last 5 days.  Patient was seen at her neurologist office yesterday for headache management and she is awaiting insurance approval for a chronic medication.  She received a dose of Toradol and Benadryl and reports that did not sufficiently alleviate her headache.  She is currently taking [name] which has been ineffective.  She is having photosensitivity and fatigue in addition to the headache related pain. Past Medical History:  Diagnosis Date  . Arthritis    OA in hands, hip and feet  . Chronic sinusitis   . Complication of anesthesia   . Depression   . GERD (gastroesophageal reflux disease)   . Joint pain   . Migraine   . PONV (postoperative nausea and vomiting)     Patient Active Problem List   Diagnosis Date Noted  . COVID-19 virus infection 04/06/2019  . Peroneal tendon tear 07/20/2015  . Left ankle pain 08/03/2010  . Left shoulder pain 06/29/2010    Past Surgical History:  Procedure Laterality Date  . ABDOMINAL HYSTERECTOMY    . ANKLE RECONSTRUCTION Right 07/20/2015   Procedure: OPEN EXPLORATION OF LATERAL RIGHT PERONEUS BREVIS TENDON WITH REPAIR ;  Surgeon: Garald Balding, MD;  Location: [facility];  Service: Orthopedics;  Laterality: Right;  . CESAREAN SECTION     x 2  . CHOLECYSTECTOMY  2012   lap choli  . MASS EXCISION  06/03/2011   Procedure: EXCISION MASS;  Surgeon: Schuyler Amor, MD;  Location: [facility];  Service: Orthopedics;  Laterality: Right;  EXCISION MASS RIGHT MIDDLE FINGER   . NASAL SINUS SURGERY  2007, 2010    OB History   No obstetric history on file.      Home  Medications    Prior to Admission medications   Medication Sig Start Date End Date Taking? Authorizing Provider  albuterol ([name];[name]) 108 (90 Base) MCG/ACT inhaler     [provider]  baclofen ([name]) 10 MG tablet Take 1 tablet (10 mg total) by mouth 3 (three) times daily as needed for muscle spasms. 04/14/18   Pieter Partridge, DO  Cholecalciferol ([name]) 2000 units CAPS Take 1 capsule by mouth daily.    [provider]  co-enzyme Q-10 50 MG capsule Take 100 mg by mouth daily.    [provider]  diclofenac (VOLTAREN) 50 MG EC tablet TAKE 1 TABLET BY MOUTH EVERY DAY AS NEEDED 04/13/19   Pieter Partridge, DO  DULoxetine (CYMBALTA) 60 MG capsule Take 60 mg by mouth daily. 03/17/19   [provider]  esomeprazole ([name]) 40 MG capsule Take 40 mg by mouth daily at 12 noon.    [provider]  famotidine (PEPCID) 40 MG tablet Take 40 mg by mouth daily. 03/23/19   [provider]  Galcanezumab-gnlm (EMGALITY) 120 MG/ML SOAJ Inject 120 mg into the skin every 30 (thirty) days. 03/15/19   Pieter Partridge, DO  magnesium oxide ([name]) 400 MG tablet Take 400 mg by mouth daily.    [provider]  midodrine (PROAMATINE) 5 MG tablet Take 1 tablet (  5 mg total) by mouth 2 (two) times daily with a meal. 11/02/19   Munley, Hilton Cork, MD  ondansetron (ZOFRAN) 8 MG tablet Take 8 mg by mouth as needed for nausea or vomiting.    [provider]  OSPHENA 60 MG TABS Take 1 tablet by mouth 3 (three) times a week. 03/22/19   [provider]  PARoxetine Mesylate 7.5 MG CAPS Take 1 capsule by mouth at bedtime. 03/31/19   [provider]  Rimegepant Sulfate (NURTEC) 75 MG TBDP Take 1 tablet by mouth daily as needed. 12/14/18   Pieter Partridge, DO  topiramate ER ([name]) 200 MG CS24 sprinkle capsule Take 1 capsule (200 mg total) by mouth at bedtime. 11/11/19   Pieter Partridge, DO  triamcinolone cream (KENALOG) 0.1 % Apply 1  application topically as needed.    [provider]    Family History Family History  Problem Relation Age of Onset  . Diabetes Mother   . Diabetes Father   . Heart attack Father   . Hypertension Father   . Rheum arthritis Father   . Diabetes Maternal Grandfather   . Heart attack Maternal Grandfather   . Hypertension Maternal Grandfather   . Diabetes Paternal Grandfather   . Heart attack Paternal Grandfather   . Hypertension Paternal Grandfather   . High blood pressure Brother   . Diabetes Brother   . Asthma Son   . GER disease Son   . Rheum arthritis Daughter   . Migraines Daughter     Social History Social History   Tobacco Use  . Smoking status: Former Smoker    Packs/day: 0.25    Years: 5.00    Pack years: 1.25    Types: Cigarettes    Quit date: 05/30/1989    Years since quitting: 30.5  . Smokeless tobacco: Never Used  . Tobacco comment: social smoker per pt  Vaping Use  . Vaping Use: Never used  Substance Use Topics  . Alcohol use: Not Currently    Comment: occasionally  . Drug use: No     Allergies   Aspirin, Ibuprofen, Metoclopramide hcl, Sulfa antibiotics, Triptans, Zithromax [azithromycin dihydrate], and Dilaudid [hydromorphone hcl]   Review of Systems Review of Systems Pertinent negatives listed in HPI Physical Exam Triage Vital Signs ED Triage Vitals  Enc Vitals Group     BP 12/01/19 1332 132/90     Pulse Rate 12/01/19 1332 88     Resp 12/01/19 1332 16     Temp 12/01/19 1332 97.6 F (36.4 C)     Temp Source 12/01/19 1332 Oral     SpO2 12/01/19 1332 100 %     Weight --      Height --      Head Circumference --      Peak Flow --      Pain Score 12/01/19 1329 9     Pain Loc --      Pain Edu? --      Excl. in [place]? --    No data found.  Updated Vital Signs BP 132/90 (BP Location: Left Arm)   Pulse 88   Temp 97.6 F (36.4 C) (Oral)   Resp 16   LMP  (LMP Unknown)   SpO2 100%   Visual Acuity Right Eye Distance:   Left Eye  Distance:   Bilateral Distance:    Right Eye Near:   Left Eye Near:    Bilateral Near:     Physical Exam  Constitutional:      General: She is in acute distress.     Comments: Related to acute headache pain  Cardiovascular:     Rate and Rhythm: Normal rate.     Pulses: Normal pulses.  Neurological:     General: No focal deficit present.     Mental Status: She is alert and oriented to person, place, and time.     Comments: Patient is ambulatory with symmetric movements.    Limited exam due to patient is in significant pain and unable to tolerate any form of light She is speaking appropriately and clearly in full sentences.   UC Treatments / Results  Labs (all labs ordered are listed, but only abnormal results are displayed) Labs Reviewed - No data to display  EKG   Radiology No results found.  Procedures Procedures (including critical care time)  Medications Ordered in UC Medications  ketorolac (TORADOL) injection 60 mg (60 mg Intramuscular Given 12/01/19 1410)  dexamethasone (DECADRON) injection 10 mg (10 mg Intramuscular Given 12/01/19 1410)  diphenhydrAMINE (BENADRYL) injection 25 mg (25 mg Intramuscular Given 12/01/19 1410)    Initial Impression / Assessment and Plan / UC Course  I have reviewed the triage vital signs and the nursing notes.  Pertinent labs & imaging results that were available during my care of the patient were reviewed by me and considered in my medical decision making (see chart for details).    Headache cocktail administered here in clinic today minus the [name] due to patient intolerance and minus the Reglan due to patient allergy.  Patient reported no significant improvement in pain however did feel drowsy she is accompanied by her daughter who is going to drive her back home.  Patient advised if headache pain did not resolve within the next few hours she would go to [employer].  While patient was here to check [facility]  and they currently have about a 2-1/2-hour wait time.  Patient advised that she would prefer to go home and lay down with the medications work and possibly go later if her headache continues.  Patient aware to go to ER if any red flag symptoms develop.  Patient verbalized understanding agreement with plan was ambulatory at discharge. Final Clinical Impressions(s) / UC Diagnoses   Final diagnoses:  Recurrent headache  Migraine with status migrainosus, not intractable, unspecified migraine type     Discharge Instructions     If headache pain doesn't subside, go to the Emergency Department.    ED Prescriptions    None     PDMP not reviewed this encounter.   Scot Jun, FNP 12/01/19 [phone]

## 2019-12-01 NOTE — Progress Notes (Signed)
DAINE CROKER (Key: [id number]) Nurtec 75MG  dispersible tablets   Form Forensic psychologist Prior Authorization Form (Envolve) (CB) 2017 NCPDP Created 13 hours ago Sent to Plan 1 minute ago Plan Response less than a minute ago Submit Clinical Questions Determination N/A Message from Plan Prior Authorization not required for patient/medication.

## 2019-12-01 NOTE — ED Triage Notes (Signed)
Patient presents to Urgent Care with complaints of migraine headache since 5 days ago. Patient reports she gets them regularly, saw neurology and got a migraine cocktail (toradol and benadryl) but it did not help. Pt endorses vision changes, some nausea, denies vomiting.

## 2019-12-03 NOTE — Progress Notes (Signed)
Received fax from [facility] with PA forms for patient Emgality 120. Faxed back and received Approval valid for 30 days 12/03/19 to 01/03/20. [id number].

## 2019-12-08 DIAGNOSIS — M7062 Trochanteric bursitis, left hip: Secondary | ICD-10-CM | POA: Diagnosis not present

## 2019-12-08 DIAGNOSIS — M797 Fibromyalgia: Secondary | ICD-10-CM | POA: Diagnosis not present

## 2019-12-08 DIAGNOSIS — M7061 Trochanteric bursitis, right hip: Secondary | ICD-10-CM | POA: Diagnosis not present

## 2019-12-10 ENCOUNTER — Emergency Department (HOSPITAL_COMMUNITY)
Admission: EM | Admit: 2019-12-10 | Discharge: 2019-12-10 | Disposition: A | Discharge status: Home / Self Care | Payer: BC Managed Care – PPO | Attending: Emergency Medicine | Admitting: Emergency Medicine

## 2019-12-10 ENCOUNTER — Encounter (HOSPITAL_COMMUNITY): Payer: Self-pay | Admitting: Emergency Medicine

## 2019-12-10 ENCOUNTER — Other Ambulatory Visit: Payer: Self-pay

## 2019-12-10 DIAGNOSIS — G43119 Migraine with aura, intractable, without status migrainosus: Secondary | ICD-10-CM | POA: Diagnosis not present

## 2019-12-10 DIAGNOSIS — Z8616 Personal history of COVID-19: Secondary | ICD-10-CM | POA: Diagnosis not present

## 2019-12-10 DIAGNOSIS — G43809 Other migraine, not intractable, without status migrainosus: Secondary | ICD-10-CM | POA: Diagnosis not present

## 2019-12-10 DIAGNOSIS — G43801 Other migraine, not intractable, with status migrainosus: Secondary | ICD-10-CM

## 2019-12-10 DIAGNOSIS — Z87891 Personal history of nicotine dependence: Secondary | ICD-10-CM | POA: Insufficient documentation

## 2019-12-10 MED ORDER — KETOROLAC TROMETHAMINE 15 MG/ML IJ SOLN
15.0000 mg | Freq: Once | INTRAMUSCULAR | Status: AC
  Administered 2019-12-10: 15 mg via INTRAVENOUS
  Filled 2019-12-10: qty 1

## 2019-12-10 MED ORDER — MAGNESIUM SULFATE IN D5W 1-5 GM/100ML-% IV SOLN
1.0000 g | Freq: Once | INTRAVENOUS | Status: AC
  Administered 2019-12-10: 1 g via INTRAVENOUS
  Filled 2019-12-10: qty 100

## 2019-12-10 MED ORDER — SODIUM CHLORIDE 0.9 % IV BOLUS
500.0000 mL | Freq: Once | INTRAVENOUS | Status: AC
  Administered 2019-12-10: 500 mL via INTRAVENOUS

## 2019-12-10 MED ORDER — DIPHENHYDRAMINE HCL 50 MG/ML IJ SOLN
25.0000 mg | Freq: Once | INTRAMUSCULAR | Status: AC
  Administered 2019-12-10: 25 mg via INTRAVENOUS
  Filled 2019-12-10: qty 1

## 2019-12-10 MED ORDER — PROCHLORPERAZINE EDISYLATE 10 MG/2ML IJ SOLN
10.0000 mg | Freq: Once | INTRAMUSCULAR | Status: AC
  Administered 2019-12-10: 10 mg via INTRAVENOUS
  Filled 2019-12-10: qty 2

## 2019-12-10 MED ORDER — DEXAMETHASONE SODIUM PHOSPHATE 4 MG/ML IJ SOLN
4.0000 mg | Freq: Once | INTRAMUSCULAR | Status: AC
  Administered 2019-12-10: 4 mg via INTRAVENOUS
  Filled 2019-12-10: qty 1

## 2019-12-10 NOTE — ED Provider Notes (Signed)
[facility] DEPT Provider Note   CSN: [id number] Arrival date & time: 12/10/19  2056     History Chief Complaint  Patient presents with  . Migraine    Jody Blackwell is a 54 y.o. female presented to emergency department with a migraine.  The patient suffers from chronic debilitating migraines and follows with neurology for this.  She reports that for the past 2 weeks she has had a recurrence of her migraine, coming and going but never living completely, more intense it has ever been before.  She describes a throbbing sensation in the back of her head and occasional blurred vision.  She describes constant nausea.  She says she thinks part of the reason she is having a headache is that she had come off all of her migraine medications in anticipation of starting Vyepti injections, which she received her first infusion of yesterday.  She continuing only her [name] currently. She went to see her neurologist this past week and received shots of Toradol and Benadryl, but has no achieved relief.  Allergies to ibuprofen but NOT toradol - has received in ED before Allergies to reglan Has strong adverse reaction to dilaudid and does not want opioids  HPI     Past Medical History:  Diagnosis Date  . Arthritis    OA in hands, hip and feet  . Chronic sinusitis   . Complication of anesthesia   . Depression   . GERD (gastroesophageal reflux disease)   . Joint pain   . Migraine   . PONV (postoperative nausea and vomiting)     Patient Active Problem List   Diagnosis Date Noted  . COVID-19 virus infection 04/06/2019  . Peroneal tendon tear 07/20/2015  . Left ankle pain 08/03/2010  . Left shoulder pain 06/29/2010    Past Surgical History:  Procedure Laterality Date  . ABDOMINAL HYSTERECTOMY    . ANKLE RECONSTRUCTION Right 07/20/2015   Procedure: OPEN EXPLORATION OF LATERAL RIGHT PERONEUS BREVIS TENDON WITH REPAIR ;  Surgeon: Garald Balding, MD;   Location: [facility];  Service: Orthopedics;  Laterality: Right;  . CESAREAN SECTION     x 2  . CHOLECYSTECTOMY  2012   lap choli  . MASS EXCISION  06/03/2011   Procedure: EXCISION MASS;  Surgeon: Schuyler Amor, MD;  Location: [facility];  Service: Orthopedics;  Laterality: Right;  EXCISION MASS RIGHT MIDDLE FINGER   . NASAL SINUS SURGERY  2007, 2010     OB History   No obstetric history on file.     Family History  Problem Relation Age of Onset  . Diabetes Mother   . Diabetes Father   . Heart attack Father   . Hypertension Father   . Rheum arthritis Father   . Diabetes Maternal Grandfather   . Heart attack Maternal Grandfather   . Hypertension Maternal Grandfather   . Diabetes Paternal Grandfather   . Heart attack Paternal Grandfather   . Hypertension Paternal Grandfather   . High blood pressure Brother   . Diabetes Brother   . Asthma Son   . GER disease Son   . Rheum arthritis Daughter   . Migraines Daughter     Social History   Tobacco Use  . Smoking status: Former Smoker    Packs/day: 0.25    Years: 5.00    Pack years: 1.25    Types: Cigarettes    Quit date: 05/30/1989    Years since quitting: 30.5  .  Smokeless tobacco: Never Used  . Tobacco comment: social smoker per pt  Vaping Use  . Vaping Use: Never used  Substance Use Topics  . Alcohol use: Not Currently    Comment: occasionally  . Drug use: No    Home Medications Prior to Admission medications   Medication Sig Start Date End Date Taking? Authorizing Provider  albuterol ([name];[name]) 108 (90 Base) MCG/ACT inhaler     [provider]  baclofen ([name]) 10 MG tablet Take 1 tablet (10 mg total) by mouth 3 (three) times daily as needed for muscle spasms. 04/14/18   Pieter Partridge, DO  Cholecalciferol ([name]) 2000 units CAPS Take 1 capsule by mouth daily.    [provider]  co-enzyme Q-10 50 MG capsule Take 100 mg by mouth daily.     [provider]  diclofenac (VOLTAREN) 50 MG EC tablet TAKE 1 TABLET BY MOUTH EVERY DAY AS NEEDED 04/13/19   Pieter Partridge, DO  DULoxetine (CYMBALTA) 60 MG capsule Take 60 mg by mouth daily. 03/17/19   [provider]  esomeprazole ([name]) 40 MG capsule Take 40 mg by mouth daily at 12 noon.    [provider]  famotidine (PEPCID) 40 MG tablet Take 40 mg by mouth daily. 03/23/19   [provider]  Galcanezumab-gnlm (EMGALITY) 120 MG/ML SOAJ Inject 120 mg into the skin every 30 (thirty) days. 03/15/19   Pieter Partridge, DO  magnesium oxide ([name]) 400 MG tablet Take 400 mg by mouth daily.    [provider]  midodrine (PROAMATINE) 5 MG tablet Take 1 tablet (5 mg total) by mouth 2 (two) times daily with a meal. 11/02/19   Munley, Hilton Cork, MD  ondansetron (ZOFRAN) 8 MG tablet Take 8 mg by mouth as needed for nausea or vomiting.    [provider]  OSPHENA 60 MG TABS Take 1 tablet by mouth 3 (three) times a week. 03/22/19   [provider]  PARoxetine Mesylate 7.5 MG CAPS Take 1 capsule by mouth at bedtime. 03/31/19   [provider]  Rimegepant Sulfate (NURTEC) 75 MG TBDP Take 1 tablet by mouth daily as needed. 12/14/18   Pieter Partridge, DO  topiramate ER ([name]) 200 MG CS24 sprinkle capsule Take 1 capsule (200 mg total) by mouth at bedtime. 11/11/19   Pieter Partridge, DO  triamcinolone cream (KENALOG) 0.1 % Apply 1 application topically as needed.    [provider]    Allergies    Aspirin, Ibuprofen, Metoclopramide hcl, Sulfa antibiotics, Triptans, Zithromax [azithromycin dihydrate], and Dilaudid [hydromorphone hcl]  Review of Systems   Review of Systems  Constitutional: Negative for chills and fever.  Eyes: Positive for photophobia and visual disturbance.  Respiratory: Negative for cough and shortness of breath.   Cardiovascular: Negative for chest pain and palpitations.  Gastrointestinal: Positive for nausea. Negative  for abdominal pain.  Musculoskeletal: Negative for arthralgias and neck pain.  Skin: Negative for color change and rash.  Neurological: Positive for dizziness, light-headedness and headaches.  Psychiatric/Behavioral: Negative for agitation and confusion.  All other systems reviewed and are negative.   Physical Exam Updated Vital Signs BP (!) 156/114 (BP Location: Left Arm)   Pulse (!) 102   Temp 97.8 F (36.6 C) (Oral)   Resp 20   Ht 5\' 6"  (1.676 m)   Wt 95.3 kg   LMP  (LMP Unknown)   SpO2 99%   BMI 33.89 kg/m   Physical Exam  Vitals and nursing note reviewed.  Constitutional:      General: She is not in acute distress.    Appearance: She is well-developed.  HENT:     Head: Normocephalic and atraumatic.  Eyes:     Comments: Sensitive to light  Cardiovascular:     Rate and Rhythm: Normal rate and regular rhythm.     Pulses: Normal pulses.  Pulmonary:     Effort: Pulmonary effort is normal. No respiratory distress.     Breath sounds: Normal breath sounds.  Abdominal:     Palpations: Abdomen is soft.     Tenderness: There is no abdominal tenderness.  Musculoskeletal:     Cervical back: Neck supple.  Skin:    General: Skin is warm and dry.  Neurological:     General: No focal deficit present.     Mental Status: She is alert and oriented to person, place, and time.     ED Results / Procedures / Treatments   Labs (all labs ordered are listed, but only abnormal results are displayed) Labs Reviewed - No data to display  EKG None  Radiology No results found.  Procedures Procedures (including critical care time)  Medications Ordered in ED Medications  ketorolac (TORADOL) 15 MG/ML injection 15 mg (15 mg Intravenous Given 12/10/19 2149)  dexamethasone (DECADRON) injection 4 mg (4 mg Intravenous Given 12/10/19 2149)  prochlorperazine (COMPAZINE) injection 10 mg (10 mg Intravenous Given 12/10/19 2149)  magnesium sulfate IVPB 1 g 100 mL (0 g Intravenous Stopped  12/10/19 2259)  diphenhydrAMINE (BENADRYL) injection 25 mg (25 mg Intravenous Given 12/10/19 2149)  sodium chloride 0.9 % bolus 500 mL (0 mLs Intravenous Stopped 12/10/19 2259)    ED Course  I have reviewed the triage vital signs and the nursing notes.  Pertinent labs & imaging results that were available during my care of the patient were reviewed by me and considered in my medical decision making (see chart for details).  This is a 54 year old female presenting to emergency department with acute on chronic migraine headache.  She has recurrent headaches, and likely having a breakthrough migraine in the setting that her stopping all of her migraine medications for Vyepti infusions.    It is not unusual for her migraines to extend for several days as it has currently.  We discussed which IV medications seem to work better for her in the IV cocktail.  We agreed to try IV Toradol, IV Benadryl, IV Decadron, IV Compazine, and IV magnesium.  We can give this with a small fluid bolus.  There is no significant variation in the pattern of her headaches that she has had this is a new process such as intracranial hemorrhage or meningitis.  She has no fever or neck stiffness.  She had covid last year, no other URI symptoms.  Clinical Course as of Dec 09 2299  Fri Dec 10, 2019  2259 Patient reporting significant improvement of migraine with medications, wants to go home to bed, will discharge with husband.   [MT]    Clinical Course User Index [MT] Lore Polka, Carola Rhine, MD    Final Clinical Impression(s) / ED Diagnoses Final diagnoses:  Other migraine with status migrainosus, not intractable    Rx / DC Orders ED Discharge Orders    None       Langston Masker Carola Rhine, MD 12/10/19 [phone]

## 2019-12-10 NOTE — ED Triage Notes (Signed)
Patient is complaining of a migraine that she has had for two weeks. Patient states she is having sensitivity to light and nausea.

## 2019-12-10 NOTE — Discharge Instructions (Addendum)
Please follow up with your neurologist tomorrow.

## 2019-12-15 ENCOUNTER — Telehealth: Payer: BC Managed Care – PPO | Admitting: Neurology

## 2019-12-15 ENCOUNTER — Telehealth: Payer: Self-pay | Admitting: Neurology

## 2019-12-15 MED ORDER — PREDNISONE 10 MG (21) PO TBPK: ORAL_TABLET | ORAL | 0 refills | Status: DC

## 2019-12-15 NOTE — Telephone Encounter (Signed)
We can give her a prednisone taper.  I do not fill out disability/short-term disability for headaches

## 2019-12-15 NOTE — Telephone Encounter (Signed)
Patient called in stating there will be some short term disability paperwork sent in for her. She starts her leave today. She has also had a headache for 2 and a half weeks now. She has had 2 headache cocktails and went to the ED Friday for treatment for it.

## 2019-12-15 NOTE — Telephone Encounter (Signed)
Pt advised she can have the taper. Script sent into the pharmacy.   Pt also advised that DR. Tomi Likens will not fill out her short term disability forms.

## 2019-12-15 NOTE — Telephone Encounter (Signed)
Pt states she was seen in the ED last Friday some relief for a day or two. But it has returned. Pt did start Vyepti as well last Friday. The rescue medication not working. Pt state she is having some numbness on the right side of her face that is normal for a migraine that last this long. Pt hasn't slept in days due to the migraine.   Per pt she was told to limit her use of Diclofenac. Due to stage 3 Chronic kidney diease.     Pt wanted to know what she can do. Pt hasn't had a Prednisone taper for this migraine.

## 2019-12-16 ENCOUNTER — Ambulatory Visit
Admission: RE | Admit: 2019-12-16 | Discharge: 2019-12-16 | Disposition: A | Discharge status: Home / Self Care | Payer: BC Managed Care – PPO | Source: Ambulatory Visit | Attending: Physician Assistant | Admitting: Physician Assistant

## 2019-12-16 ENCOUNTER — Other Ambulatory Visit: Payer: Self-pay

## 2019-12-16 DIAGNOSIS — Z0000 Encounter for general adult medical examination without abnormal findings: Secondary | ICD-10-CM

## 2019-12-16 DIAGNOSIS — Z1231 Encounter for screening mammogram for malignant neoplasm of breast: Secondary | ICD-10-CM | POA: Diagnosis not present

## 2019-12-16 NOTE — Progress Notes (Unsigned)
Short Term Disability Forms filled out and fax.

## 2019-12-18 ENCOUNTER — Other Ambulatory Visit: Payer: Self-pay | Admitting: Neurology

## 2019-12-23 ENCOUNTER — Other Ambulatory Visit: Payer: Self-pay

## 2019-12-23 DIAGNOSIS — R519 Headache, unspecified: Secondary | ICD-10-CM

## 2019-12-31 DIAGNOSIS — M7062 Trochanteric bursitis, left hip: Secondary | ICD-10-CM | POA: Diagnosis not present

## 2019-12-31 DIAGNOSIS — M7061 Trochanteric bursitis, right hip: Secondary | ICD-10-CM | POA: Diagnosis not present

## 2019-12-31 DIAGNOSIS — M797 Fibromyalgia: Secondary | ICD-10-CM | POA: Diagnosis not present

## 2020-01-05 DIAGNOSIS — M7061 Trochanteric bursitis, right hip: Secondary | ICD-10-CM | POA: Diagnosis not present

## 2020-01-05 DIAGNOSIS — M7062 Trochanteric bursitis, left hip: Secondary | ICD-10-CM | POA: Diagnosis not present

## 2020-01-05 DIAGNOSIS — M797 Fibromyalgia: Secondary | ICD-10-CM | POA: Diagnosis not present

## 2020-01-07 ENCOUNTER — Encounter: Payer: Self-pay | Admitting: Neurology

## 2020-01-07 ENCOUNTER — Telehealth: Payer: Self-pay | Admitting: Neurology

## 2020-01-10 NOTE — Telephone Encounter (Signed)
x

## 2020-01-12 DIAGNOSIS — M7062 Trochanteric bursitis, left hip: Secondary | ICD-10-CM | POA: Diagnosis not present

## 2020-01-12 DIAGNOSIS — M7061 Trochanteric bursitis, right hip: Secondary | ICD-10-CM | POA: Diagnosis not present

## 2020-01-12 DIAGNOSIS — M797 Fibromyalgia: Secondary | ICD-10-CM | POA: Diagnosis not present

## 2020-01-13 ENCOUNTER — Other Ambulatory Visit: Payer: Self-pay | Admitting: Neurology

## 2020-01-15 ENCOUNTER — Ambulatory Visit
Admission: RE | Admit: 2020-01-15 | Discharge: 2020-01-15 | Disposition: A | Discharge status: Home / Self Care | Payer: BC Managed Care – PPO | Source: Ambulatory Visit | Attending: Neurology | Admitting: Neurology

## 2020-01-15 DIAGNOSIS — R519 Headache, unspecified: Secondary | ICD-10-CM | POA: Diagnosis not present

## 2020-01-15 DIAGNOSIS — R9082 White matter disease, unspecified: Secondary | ICD-10-CM | POA: Diagnosis not present

## 2020-01-15 MED ORDER — GADOBENATE DIMEGLUMINE 529 MG/ML IV SOLN
20.0000 mL | Freq: Once | INTRAVENOUS | Status: AC | PRN
  Administered 2020-01-15: 20 mL via INTRAVENOUS

## 2020-01-17 NOTE — Progress Notes (Addendum)
NEUROLOGY FOLLOW UP OFFICE NOTE  J[facility] Blackwell [id number]   Subjective:  Jody Blackwell is a32 year old woman with chronic pain syndrome, depression and GERD who follows up for migraines.  UPDATE: She started having increased migraines following a bout of COVID-related pneumonia and tachycardia.  Over this past year, she has had increased intractable migraines.  She started first Vyepti infusion on 12/10/2019.  She has been to the ED twice in October for intractable migraines.  Prednisone taper provided some relief while on it but headaches returned once she finished the taper.  Due to continued intractable chronic migraines, she was placed on short-term disability.  MRI of brain with and without contrast on 01/15/2020 personally reviewed was normal.  Past couple of weeks have been better.  Since 11/1, she has had 15 migraine days but only 3 severe.  In October, she had 26 migraine days.  They have been lasting 2-3 days but has relief within a couple of hours when takes Nurtec.  She has been treating with diclofenac and baclofen at night.  She still uses Nurtec.  She tried [place] but only one time it worked, otherwise not effective.  Over past 2 months, she reports that her hands shake.  Also her chin quivers, noticeable at night.  She reports that her mom has essential tremor.  She also reports significant hot flashes with associated palpitations.  She also has fairly persistent fatigue.  She wants to sleep often.  She takes a Benadryl every night which helps her sleep at night.  She does snore and was told she had pauses in breathing.  Her family had OSA.   q  She has had some work off-loaded which will decrease stress.    Mini-prophylaxis: none Current NSAIDS:diclofenac Current analgesics:None Current triptans:None Current ergotamine:none Current anti-emetic:Zofran 8 mg Current muscle relaxants:Baclofen Current anti-anxiolytic:None Current sleep aide:None Current  Antihypertensive medications:None Current Antidepressant medications:Cymbalta 60mg  Current Anticonvulsant medications:none Current anti-CGRP:Vyepti, Nurtec (rescue) Current Vitamins/Herbal/Supplements:Magnesium 400 mg, D Current Antihistamines/Decongestants:Benadryl QHS Other therapy:none Hormone:none  Caffeine:2 cups coffee daily Diet:Hydrates. No soda. Exercise:Gym 3 days a week Depression:No; Anxiety:No. History of depression. Other pain:No .  HISTORY: Onset: Her early 18s Location: right occipital Quality: pounding, like head is going to explode Initial Intensity: Severe prior to [place]. Now moderate and sometimes severe. Aura: Severe headache preceded by left tunnel vision for 15-20 minutes Prodrome: Generalized aches, tired Postdrome: Tired, foggy, washed out Associated symptoms: Nausea, photophobia, phonophobia, osmophobia, blurred vision. Left facial numbness when severe. No unilateral weakness. Initial Duration: Prior to [name], 2 days up to 2 weeks. Now 4 hours with sumatriptan 50mg . Initial Frequency: Prior to [name], 20+ days per month. Now 10 days per month (worse the week prior to next injection). Initial Frequency of abortive medication: as needed Triggers: Artificial sweeteners, tea, raw onions, perfumes/lotions, skipped meals, decreased sleep, change in weather Relieving factors: Rest Activity: Aggravates. Cannot function about 2 to 3 days a month.  Past NSAIDS: ibuprofen, naproxen "min-prophylaxis" 2 weeks prior to next Emgality shot, diclofenac 50mg , [place], naproxen Past analgesics: Tylenol Past abortive triptans: Sumatriptan 50 mg (ineffective),Sumatriptan patch (effective),[name]/[name] (rapid heartbeat, flushing), [name] 5mg  NS (ineffective) Past ergotamine: Migranal (nose bleeds), Zembrace SymTouch (side effects) Past muscle relaxants: no Past anti-emetic: no Past antihypertensive medications: propranolol  (initially effective for many years), verapamil Past antidepressant medications: Nortriptyline, amitriptyline, escitalopram Past anticonvulsant medications: gabapentin, [name]200 mg at bedtime Past anti--CGRP: [name], Emgality, Ubrelvy Past vitamins/Herbal/Supplements: no Past antihistamines/decongestants: no Other past therapies: Trigger point injections/nerve blocks;  Reyvow (rescue)  Family history of headache: Mother, maternal grandmother  03/22/15: Sed Rate 3  PAST MEDICAL HISTORY: Past Medical History:  Diagnosis Date  . Arthritis    OA in hands, hip and feet  . Chronic sinusitis   . Complication of anesthesia   . Depression   . GERD (gastroesophageal reflux disease)   . Joint pain   . Migraine   . PONV (postoperative nausea and vomiting)     MEDICATIONS: Current Outpatient Medications on File Prior to Visit  Medication Sig Dispense Refill  . albuterol ([name];[name]) 108 (90 Base) MCG/ACT inhaler     . baclofen ([name]) 10 MG tablet TAKE 1 TABLET(10 MG) BY MOUTH THREE TIMES DAILY AS NEEDED FOR MUSCLE SPASMS 30 tablet 1  . Cholecalciferol ([name]) 2000 units CAPS Take 1 capsule by mouth daily.    [name] Kitchen co-enzyme Q-10 50 MG capsule Take 100 mg by mouth daily.    . diclofenac (VOLTAREN) 50 MG EC tablet TAKE 1 TABLET BY MOUTH EVERY DAY AS NEEDED 30 tablet 2  . DULoxetine (CYMBALTA) 60 MG capsule Take 60 mg by mouth daily.    [name] Kitchen esomeprazole ([name]) 40 MG capsule Take 40 mg by mouth daily at 12 noon.    . famotidine (PEPCID) 40 MG tablet Take 40 mg by mouth daily.    . Galcanezumab-gnlm (EMGALITY) 120 MG/ML SOAJ Inject 120 mg into the skin every 30 (thirty) days. 1 pen 5  . magnesium oxide ([name]) 400 MG tablet Take 400 mg by mouth daily.    . midodrine (PROAMATINE) 5 MG tablet Take 1 tablet (5 mg total) by mouth 2 (two) times daily with a meal. 180 tablet 3  . NURTEC 75 MG TBDP TAKE 1 TABLET BY MOUTH EVERY DAY AS NEEDED 8 tablet 3  . ondansetron  (ZOFRAN) 8 MG tablet Take 8 mg by mouth as needed for nausea or vomiting.    . OSPHENA 60 MG TABS Take 1 tablet by mouth 3 (three) times a week.    [name] Kitchen PARoxetine Mesylate 7.5 MG CAPS Take 1 capsule by mouth at bedtime.    . predniSONE (STERAPRED UNI-PAK 21 TAB) 10 MG (21) TBPK tablet take 60mg  day 1, then 50mg  day 2, then 40mg  day 3, then 30mg  day 4, then 20mg  day 5, then 10mg  day 6, then STOP 21 tablet 0  . topiramate ER ([name]) 200 MG CS24 sprinkle capsule Take 1 capsule (200 mg total) by mouth at bedtime. 30 capsule 2  . triamcinolone cream (KENALOG) 0.1 % Apply 1 application topically as needed.     No current facility-administered medications on file prior to visit.    ALLERGIES: Allergies  Allergen Reactions  . Aspirin Hives  . Ibuprofen Hives  . 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 Sulfa Antibiotics Hives  . Triptans Other (See Comments)    Sensitivity to injectable triptan drugs per pt  . Zithromax [Azithromycin Dihydrate] Hives  . Dilaudid [Hydromorphone Hcl] Palpitations    FAMILY HISTORY: Family History  Problem Relation Age of Onset  . Diabetes Mother   . Diabetes Father   . Heart attack Father   . Hypertension Father   . Rheum arthritis Father   . Diabetes Maternal Grandfather   . Heart attack Maternal Grandfather   . Hypertension Maternal Grandfather   . Diabetes Paternal Grandfather   . Heart attack Paternal Grandfather   . Hypertension Paternal Grandfather   . High blood  pressure Brother   . Diabetes Brother   . Asthma Son   . GER disease Son   . Rheum arthritis Daughter   . Migraines Daughter    SOCIAL HISTORY: Social History   Socioeconomic History  . Marital status: Married    Spouse name: Clair Gulling  . Number of children: 3  . Years of education: Not on file  . Highest education level: Master's degree (e.g., MA, MS, MEng, MEd, MSW, MBA)  Occupational History  . Occupation: Herbalist: [employer]   Tobacco Use  . Smoking status: Former Smoker    Packs/day: 0.25    Years: 5.00    Pack years: 1.25    Types: Cigarettes    Quit date: 05/30/1989    Years since quitting: 30.6  . Smokeless tobacco: Never Used  . Tobacco comment: social smoker per pt  Vaping Use  . Vaping Use: Never used  Substance and Sexual Activity  . Alcohol use: Not Currently    Comment: occasionally  . Drug use: No  . Sexual activity: Yes  Other Topics Concern  . Not on file  Social History Narrative   Married, lives with husband. Drinks 2 cups of coffee a day, an occasional soda. Exercises 3 X a week.   Social Determinants of Health   Financial Resource Strain:   . Difficulty of Paying Living Expenses: Not on file  Food Insecurity:   . Worried About Charity fundraiser in the Last Year: Not on file  . Ran Out of Food in the Last Year: Not on file  Transportation Needs:   . Lack of Transportation (Medical): Not on file  . Lack of Transportation (Non-Medical): Not on file  Physical Activity:   . Days of Exercise per Week: Not on file  . Minutes of Exercise per Session: Not on file  Stress:   . Feeling of Stress : Not on file  Social Connections:   . Frequency of Communication with Friends and Family: Not on file  . Frequency of Social Gatherings with Friends and Family: Not on file  . Attends Religious Services: Not on file  . Active Member of Clubs or Organizations: Not on file  . Attends Archivist Meetings: Not on file  . Marital Status: Not on file  Intimate Partner Violence:   . Fear of Current or Ex-Partner: Not on file  . Emotionally Abused: Not on file  . Physically Abused: Not on file  . Sexually Abused: Not on file     Objective:  BP 110/70, Pulse (!) 118, height 5\' 6"  (1.676 m), weight 238 lb (108 kg), SpO2 98 %. General: No acute distress.  Patient appears well-groomed.   Head:  Normocephalic/atraumatic Eyes:  Fundi examined but not visualized Neck: supple, no  paraspinal tenderness, full range of motion Heart:  Regular rate and rhythm Lungs:  Clear to auscultation bilaterally Back: No paraspinal tenderness Neurological Exam: alert and oriented to person, place, and time. Attention span and concentration intact, recent and remote memory intact, fund of knowledge intact.  Speech fluent and not dysarthric, language intact.  CN II-XII intact. Bulk and tone normal, muscle strength 5/5 throughout.  Sensation to light touch, temperature and vibration intact.  Deep tendon reflexes 2+ throughout, toes downgoing.  Finger to nose and heel to shin testing intact.  Gait normal, Romberg negative.   Assessment/Plan:   1.  Migraine with migraine status migrainosus, intractable.  Now improved.  No longer in status.  2.  Essential tremor, mild 3.  Concern for OSA - morning headaches, daytime somnolence, snores with pauses.  1.  Continue Vyepti (just over one month status post first infusion) but will increase dose to 300mg . 2.  Nurtec for rescue 3.  Will order sleep study 4.  Will monitor tremor for now. 5.  Follow up in March as scheduled.  I have written a letter for her work stating that she is cleared to return to work on Monday, January 24, 2020.  Metta Clines, DO  CC: Lennie Odor, PA

## 2020-01-19 ENCOUNTER — Encounter: Payer: Self-pay | Admitting: Neurology

## 2020-01-19 ENCOUNTER — Ambulatory Visit: Payer: BC Managed Care – PPO | Admitting: Neurology

## 2020-01-19 ENCOUNTER — Other Ambulatory Visit: Payer: Self-pay

## 2020-01-19 VITALS — HR 118 | Ht 66.0 in | Wt 238.0 lb

## 2020-01-19 DIAGNOSIS — R519 Headache, unspecified: Secondary | ICD-10-CM | POA: Diagnosis not present

## 2020-01-19 DIAGNOSIS — R400 Somnolence: Secondary | ICD-10-CM

## 2020-01-19 DIAGNOSIS — G250 Essential tremor: Secondary | ICD-10-CM

## 2020-01-19 DIAGNOSIS — G43011 Migraine without aura, intractable, with status migrainosus: Secondary | ICD-10-CM | POA: Diagnosis not present

## 2020-01-19 DIAGNOSIS — M7061 Trochanteric bursitis, right hip: Secondary | ICD-10-CM | POA: Diagnosis not present

## 2020-01-19 DIAGNOSIS — M7062 Trochanteric bursitis, left hip: Secondary | ICD-10-CM | POA: Diagnosis not present

## 2020-01-19 DIAGNOSIS — M797 Fibromyalgia: Secondary | ICD-10-CM | POA: Diagnosis not present

## 2020-01-19 DIAGNOSIS — R0683 Snoring: Secondary | ICD-10-CM

## 2020-01-19 NOTE — Progress Notes (Signed)
Per.DR.Jaffe, New form filled out with the increase in dose to 300 mg. Faxed to [facility] [employer].

## 2020-01-19 NOTE — Patient Instructions (Addendum)
1.  Increase next Vyepti to 200mg  2.  Continue Nurtec as needed 3.  Will order sleep study 4.  Will write letter releasing you back to work on Monday 5.  Follow up in March as scheduled

## 2020-01-26 DIAGNOSIS — M7062 Trochanteric bursitis, left hip: Secondary | ICD-10-CM | POA: Diagnosis not present

## 2020-01-26 DIAGNOSIS — M7061 Trochanteric bursitis, right hip: Secondary | ICD-10-CM | POA: Diagnosis not present

## 2020-01-26 DIAGNOSIS — M797 Fibromyalgia: Secondary | ICD-10-CM | POA: Diagnosis not present

## 2020-02-02 DIAGNOSIS — M7062 Trochanteric bursitis, left hip: Secondary | ICD-10-CM | POA: Diagnosis not present

## 2020-02-02 DIAGNOSIS — M7061 Trochanteric bursitis, right hip: Secondary | ICD-10-CM | POA: Diagnosis not present

## 2020-02-02 DIAGNOSIS — M797 Fibromyalgia: Secondary | ICD-10-CM | POA: Diagnosis not present

## 2020-02-09 DIAGNOSIS — M7061 Trochanteric bursitis, right hip: Secondary | ICD-10-CM | POA: Diagnosis not present

## 2020-02-09 DIAGNOSIS — M7062 Trochanteric bursitis, left hip: Secondary | ICD-10-CM | POA: Diagnosis not present

## 2020-02-09 DIAGNOSIS — M797 Fibromyalgia: Secondary | ICD-10-CM | POA: Diagnosis not present

## 2020-02-16 DIAGNOSIS — M7062 Trochanteric bursitis, left hip: Secondary | ICD-10-CM | POA: Diagnosis not present

## 2020-02-16 DIAGNOSIS — M7061 Trochanteric bursitis, right hip: Secondary | ICD-10-CM | POA: Diagnosis not present

## 2020-02-16 DIAGNOSIS — M797 Fibromyalgia: Secondary | ICD-10-CM | POA: Diagnosis not present

## 2020-02-20 NOTE — Progress Notes (Signed)
02/21/20- 41 yoF former smoker for sleep evaluation courtesy of Dr Metta Clines Neurology with concern of daytime somnolence. Medical problem list includes Migraine, Chronic Sinusitis, GERD, Covid infection Oct, 2020,  Depression,  Had seen Dr Lake Bells / Pulmonary in 2018 for patent foramen ovale> cardiology, and pulmonary AV malformation> CTa chest for May, 2019. Epworth score Body weight today- Covid vax- Flu vax- Complains of snoring, extreme fatigue, morning headaches. Several family members use CPAP. Benadryl for sleep. 2 cups AM coffee. ENT + 2 sinus surgeries. Occasional bronchitis- keeps an inhaler. Prior pneumonia, including Covid pneumonia in Oct, 2020 without ventilator. Cardiology told her nothing needed doing now about patent foramen ovale.   Denies complex parasomnias. CXR 1V- 04/05/19- IMPRESSION: Lower lung volumes, otherwise negative portable chest. CTa chest 06/26/17-  IMPRESSION: 1. Solitary small pulmonary AVM in the subpleural right middle lobe. A single feeding and draining vessel is present. No progression compared to 1 year prior. 2. History of PFO.  No cardiomegaly. 3. Possible hepatic steatosis.  Prior to Admission medications   Medication Sig Start Date End Date Taking? Authorizing Provider  albuterol ([name];[name]) 108 (90 Base) MCG/ACT inhaler    Yes [provider]  baclofen ([name]) 10 MG tablet TAKE 1 TABLET(10 MG) BY MOUTH THREE TIMES DAILY AS NEEDED FOR MUSCLE SPASMS 12/20/19  Yes Tomi Likens, Adam R, DO  Cholecalciferol ([name]) 2000 units CAPS Take 1 capsule by mouth daily.   Yes [provider]  co-enzyme Q-10 50 MG capsule Take 100 mg by mouth daily.   Yes [provider]  diclofenac (VOLTAREN) 50 MG EC tablet TAKE 1 TABLET BY MOUTH EVERY DAY AS NEEDED 04/13/19  Yes Jaffe, Adam R, DO  DULoxetine (CYMBALTA) 60 MG capsule Take 60 mg by mouth daily. 03/17/19  Yes [provider]  esomeprazole ([name]) 40 MG  capsule Take 40 mg by mouth daily at 12 noon.   Yes [provider]  famotidine (PEPCID) 40 MG tablet Take 40 mg by mouth daily. 03/23/19  Yes [provider]  magnesium oxide ([name]) 400 MG tablet Take 400 mg by mouth daily.   Yes [provider]  NURTEC 75 MG TBDP TAKE 1 TABLET BY MOUTH EVERY DAY AS NEEDED 01/13/20  Yes Jaffe, Adam R, DO  ondansetron (ZOFRAN) 8 MG tablet Take 8 mg by mouth as needed for nausea or vomiting.   Yes [provider]  OSPHENA 60 MG TABS Take 1 tablet by mouth 3 (three) times a week. 03/22/19  Yes [provider]  topiramate ER ([name]) 200 MG CS24 sprinkle capsule Take 1 capsule (200 mg total) by mouth at bedtime. 11/11/19  Yes Jaffe, Adam R, DO  triamcinolone cream (KENALOG) 0.1 % Apply 1 application topically as needed.   Yes [provider]  PARoxetine Mesylate 7.5 MG CAPS Take 1 capsule by mouth at bedtime. 03/31/19   [provider]  predniSONE (STERAPRED UNI-PAK 21 TAB) 10 MG (21) TBPK tablet take 60mg  day 1, then 50mg  day 2, then 40mg  day 3, then 30mg  day 4, then 20mg  day 5, then 10mg  day 6, then STOP 12/15/19   Pieter Partridge, DO   Past Medical History:  Diagnosis Date  . Arthritis    OA in hands, hip and feet  . Chronic sinusitis   . Complication of anesthesia   . Depression   . GERD (gastroesophageal reflux disease)   . Joint pain   . Migraine   . PONV (postoperative nausea and  vomiting)    Past Surgical History:  Procedure Laterality Date  . ABDOMINAL HYSTERECTOMY    . ANKLE RECONSTRUCTION Right 07/20/2015   Procedure: OPEN EXPLORATION OF LATERAL RIGHT PERONEUS BREVIS TENDON WITH REPAIR ;  Surgeon: Garald Balding, MD;  Location: [facility];  Service: Orthopedics;  Laterality: Right;  . CESAREAN SECTION     x 2  . CHOLECYSTECTOMY  2012   lap choli  . MASS EXCISION  06/03/2011   Procedure: EXCISION MASS;  Surgeon: Schuyler Amor, MD;  Location: [facility];  Service: Orthopedics;  Laterality: Right;  EXCISION MASS RIGHT MIDDLE FINGER   . NASAL SINUS SURGERY  2007, 2010   Family History  Problem Relation Age of Onset  . Diabetes Mother   . Diabetes Father   . Heart attack Father   . Hypertension Father   . Rheum arthritis Father   . Diabetes Maternal Grandfather   . Heart attack Maternal Grandfather   . Hypertension Maternal Grandfather   . Diabetes Paternal Grandfather   . Heart attack Paternal Grandfather   . Hypertension Paternal Grandfather   . High blood pressure Brother   . Diabetes Brother   . Asthma Son   . GER disease Son   . Rheum arthritis Daughter   . Migraines Daughter    Social History   Socioeconomic History  . Marital status: Married    Spouse name: Clair Gulling  . Number of children: 3  . Years of education: Not on file  . Highest education level: Master's degree (e.g., MA, MS, MEng, MEd, MSW, MBA)  Occupational History  . Occupation: Herbalist: [employer]  Tobacco Use  . Smoking status: Former Smoker    Packs/day: 0.25    Years: 5.00    Pack years: 1.25    Types: Cigarettes    Quit date: 05/30/1989    Years since quitting: 30.7  . Smokeless tobacco: Never Used  . Tobacco comment: social smoker per pt  Vaping Use  . Vaping Use: Never used  Substance and Sexual Activity  . Alcohol use: Not Currently    Comment: occasionally  . Drug use: No  . Sexual activity: Yes  Other Topics Concern  . Not on file  Social History Narrative   Married, lives with husband. Drinks 2 cups of coffee a day, an occasional soda. Exercises 3 X a week.   Right handed, lives in a one story home   Social Determinants of Health   Financial Resource Strain: Not on file  Food Insecurity: Not on file  Transportation Needs: Not on file  Physical Activity: Not on file  Stress: Not on file  Social Connections: Not on file  Intimate Partner Violence: Not on file   ROS-see HPI   Negative unless  "+" Constitutional:    weight loss, night sweats, fevers, chills, fatigue, lassitude. HEENT:    headaches, difficulty swallowing, tooth/dental problems, sore throat,       sneezing, itching, ear ache, nasal congestion, post nasal drip, snoring CV:    chest pain, orthopnea, PND, swelling in lower extremities, anasarca,                                  dizziness, palpitations Resp:   shortness of breath with exertion or at rest.                productive cough,  non-productive cough, coughing up of blood.              change in color of mucus.  wheezing.   Skin:    rash or lesions. GI:  No-   heartburn, indigestion, abdominal pain, nausea, vomiting, diarrhea,                 change in bowel habits, loss of appetite GU: dysuria, change in color of urine, no urgency or frequency.   flank pain. MS:   joint pain, stiffness, decreased range of motion, back pain. Neuro-     nothing unusual Psych:  change in mood or affect.  depression or anxiety.   memory loss.  OBJ- Physical Exam General- Alert, Oriented, Affect-appropriate, Distress- none acute, + obese Skin- rash-none, lesions- none, excoriation- none Lymphadenopathy- none Head- atraumatic            Eyes- Gross vision intact, PERRLA, conjunctivae and secretions clear            Ears- Hearing, canals-normal            Nose- Clear, no-Septal dev, mucus, polyps, erosion, perforation             Throat- Mallampati III-IV , mucosa clear , drainage- none, tonsils- atrophic, + teeth Neck- flexible , trachea midline, no stridor , thyroid nl, carotid no bruit Chest - symmetrical excursion , unlabored           Heart/CV- RRR , no murmur , no gallop  , no rub, nl s1 s2                           - JVD- none , edema- none, stasis changes- none, varices- none           Lung- clear to P&A, wheeze- none, cough- none , dullness-none, rub- none           Chest wall-  Abd-  Br/ Gen/ Rectal- Not done, not indicated Extrem- cyanosis- none, clubbing, none,  atrophy- none, strength- nl Neuro- grossly intact to observation

## 2020-02-21 ENCOUNTER — Encounter: Payer: Self-pay | Admitting: Internal Medicine

## 2020-02-21 ENCOUNTER — Other Ambulatory Visit: Payer: Self-pay

## 2020-02-21 ENCOUNTER — Ambulatory Visit: Payer: BC Managed Care – PPO | Admitting: Internal Medicine

## 2020-02-21 VITALS — BP 110/72 | HR 110 | Temp 97.4°F | Ht 65.5 in | Wt 239.4 lb

## 2020-02-21 DIAGNOSIS — R0683 Snoring: Secondary | ICD-10-CM | POA: Insufficient documentation

## 2020-02-21 NOTE — Assessment & Plan Note (Signed)
Significantly overweight. Lifestyle change including diet and exercise recommended toward long term normal weight.

## 2020-02-21 NOTE — Patient Instructions (Signed)
Order- schedule home sleep test    Snoring  Please call us about 2 weeks after your sleep test for results and recommendations

## 2020-02-21 NOTE — Assessment & Plan Note (Signed)
High probability for OSA based on history and exam. Appropriate discussion, driving safety, questions answered. Plan- sleep study. Likely CPAP or possibly oral appliance

## 2020-02-24 ENCOUNTER — Other Ambulatory Visit: Payer: Self-pay | Admitting: Neurology

## 2020-02-24 DIAGNOSIS — R0789 Other chest pain: Secondary | ICD-10-CM

## 2020-03-02 DIAGNOSIS — G43119 Migraine with aura, intractable, without status migrainosus: Secondary | ICD-10-CM | POA: Diagnosis not present

## 2020-03-03 NOTE — Progress Notes (Signed)
Infusion update  Infusion given 03/02/20  Tolerated Well  Next infusion 05/25/20

## 2020-03-06 DIAGNOSIS — Z1152 Encounter for screening for COVID-19: Secondary | ICD-10-CM | POA: Diagnosis not present

## 2020-03-06 DIAGNOSIS — G43909 Migraine, unspecified, not intractable, without status migrainosus: Secondary | ICD-10-CM | POA: Diagnosis not present

## 2020-03-15 ENCOUNTER — Other Ambulatory Visit: Payer: Self-pay | Admitting: Neurology

## 2020-03-22 ENCOUNTER — Telehealth: Payer: Self-pay | Admitting: Internal Medicine

## 2020-03-22 NOTE — Telephone Encounter (Signed)
I have resc patient she is aware

## 2020-03-28 ENCOUNTER — Other Ambulatory Visit: Payer: Self-pay

## 2020-03-28 ENCOUNTER — Ambulatory Visit: Payer: BC Managed Care – PPO

## 2020-03-28 DIAGNOSIS — G4733 Obstructive sleep apnea (adult) (pediatric): Secondary | ICD-10-CM | POA: Diagnosis not present

## 2020-03-28 DIAGNOSIS — R0683 Snoring: Secondary | ICD-10-CM

## 2020-03-29 DIAGNOSIS — G4733 Obstructive sleep apnea (adult) (pediatric): Secondary | ICD-10-CM | POA: Diagnosis not present

## 2020-03-31 ENCOUNTER — Telehealth: Payer: Self-pay | Admitting: Neurology

## 2020-03-31 MED ORDER — PREDNISONE 10 MG (21) PO TBPK: ORAL_TABLET | ORAL | 0 refills | Status: DC

## 2020-03-31 NOTE — Telephone Encounter (Signed)
Patient called in stating she has had a headache for about 2 weeks now and she would like to see if she can get "prednisone pack" or anything else Dr. Tomi Likens might suggest?

## 2020-03-31 NOTE — Telephone Encounter (Signed)
Send her a prednisone taper

## 2020-03-31 NOTE — Telephone Encounter (Signed)
Pt advised script sent into [facility]

## 2020-04-03 DIAGNOSIS — G43909 Migraine, unspecified, not intractable, without status migrainosus: Secondary | ICD-10-CM | POA: Diagnosis not present

## 2020-04-03 DIAGNOSIS — R110 Nausea: Secondary | ICD-10-CM | POA: Diagnosis not present

## 2020-04-12 ENCOUNTER — Telehealth: Payer: Self-pay | Admitting: Internal Medicine

## 2020-04-12 DIAGNOSIS — G4733 Obstructive sleep apnea (adult) (pediatric): Secondary | ICD-10-CM

## 2020-04-12 NOTE — Telephone Encounter (Signed)
Spoke with patient to go over sleep study results and to let her know of recommendations from Dr. Annamaria Boots as far as CPAP and oral device. Patient states that they are going to be moving to [place] in the next couple months so she would like to see if she is a canidate for the oral device.  Dr. Annamaria Boots are you ok with [place] placing referral to Dt. Ron Parker or Dr. Toy Cookey?

## 2020-04-12 NOTE — Telephone Encounter (Signed)
Home sleep test showed mild obstructive sleep apnea, averaging 8-9 apneas/ hour, with drops in blood oxygen level. With question of whether this is related to her headache problem, the best initial treatments would probably be either CPAP or a fitted oral appliance, as discussed at our first visit.  Suggest- order new DME, new CPAP auto 5-15, mask of choice, humidifier, supplies, AirView/ card  Please change her return ov to 31- 90 days after she gets her machine, per insurance regs.

## 2020-04-12 NOTE — Telephone Encounter (Signed)
HST has been scanned in. Will route message to [facility] for results.   CY please advise.   Thanks

## 2020-04-12 NOTE — Telephone Encounter (Signed)
Attempted to call pt but unable to reach. Left message for her to return call. 

## 2020-04-13 NOTE — Telephone Encounter (Signed)
Referral placed and patient given name and number for Dr. Toy Cookey

## 2020-04-13 NOTE — Telephone Encounter (Signed)
Either referral would be fine for oral appliance. Maybe Dr Augustina Mood, DDS could see her soonest to consider oral appliance for OSA

## 2020-04-28 DIAGNOSIS — F339 Major depressive disorder, recurrent, unspecified: Secondary | ICD-10-CM | POA: Diagnosis not present

## 2020-05-01 ENCOUNTER — Other Ambulatory Visit: Payer: Self-pay | Admitting: Neurology

## 2020-05-02 ENCOUNTER — Other Ambulatory Visit: Payer: Self-pay | Admitting: Neurology

## 2020-05-03 DIAGNOSIS — M7062 Trochanteric bursitis, left hip: Secondary | ICD-10-CM | POA: Diagnosis not present

## 2020-05-03 DIAGNOSIS — M7061 Trochanteric bursitis, right hip: Secondary | ICD-10-CM | POA: Diagnosis not present

## 2020-05-03 DIAGNOSIS — M797 Fibromyalgia: Secondary | ICD-10-CM | POA: Diagnosis not present

## 2020-05-08 DIAGNOSIS — M797 Fibromyalgia: Secondary | ICD-10-CM | POA: Diagnosis not present

## 2020-05-08 DIAGNOSIS — M7062 Trochanteric bursitis, left hip: Secondary | ICD-10-CM | POA: Diagnosis not present

## 2020-05-08 DIAGNOSIS — M7061 Trochanteric bursitis, right hip: Secondary | ICD-10-CM | POA: Diagnosis not present

## 2020-05-09 NOTE — Progress Notes (Signed)
NEUROLOGY FOLLOW UP OFFICE NOTE  Jody Blackwell [id number]  Assessment/Plan:   1.  Migraine without aura, without status migrainosus, not intractable - improved - would continue Vyepti and reassess in a couple of months. 2.  Fibomyalgia 3.  Depression 4.  Essential tremor  1.  Migraine prevention:  Vyepti 300mg  Q76months, [name] 200mg  daily 2.  Migraine rescue:  Nurtec 3.  Limit use of pain relievers to no more than 2 days out of week to prevent risk of rebound or medication-overuse headache. 4.  Keep headache diary 5.  Follow recommendations for optimized sleep hygiene 6.  Monitor tremor 7.  Follow up 2 months as she may be moving to [place] in 2 to 3 months.  Subjective:  Jody Blackwell is a80 year old woman with chronic pain syndrome, essential tremor, depression and GERD who follows up for migraines.  UPDATE: Status post 2 infusions of Vyepti 300mg .  Last infusion was on January 6. Migraines are severe and last about 4 hours with Nurtec. January:  17 migraine days February:  12 migraine days March: 6 migraine days so far. She points to depression, her severe pain from fibromyalgia, and changes in weather as the triggers.  After treating with Nurtec, she may take a diclofenac and baclofen at bedtime.  Her PCP has advised that she limit use of diclofenac due to kidney disease.  Current NSAIDS:diclofenac Current analgesics:None Current triptans:None Current ergotamine:none Current anti-emetic:Zofran 8 mg Current muscle relaxants:Baclofen Current anti-anxiolytic:None Current sleep aide:None Current Antihypertensive medications:None Current Antidepressant medications:Cymbalta60mg , Wellbutrin  Current Anticonvulsant medications:[name]200 mg at bedtime Current anti-CGRP:Vyepti 300mg  (second infusion Jan 6), Nurtec (rescue) Current Vitamins/Herbal/Supplements:Magnesium 400 mg, D Current Antihistamines/Decongestants:Benadryl QHS Other  therapy:none Hormone:none   Depression:Yes; Anxiety:Yes. History of depression. Other pain:Severe fibromyalgia - unable to work due to the pain.  She is deciding if she will apply for disability. Sleep:  She had a sleep study in February 2022, which demonstrated mild OSA.  Conservative measures were recommended. [name] Kitchen  HISTORY: Onset: Her early 61s Location: right occipital Quality: pounding, like head is going to explode Initial Intensity: Severe prior to Roy. Now moderate and sometimes severe. Aura: Severe headache preceded by left tunnel vision for 15-20 minutes Prodrome: Generalized aches, tired Postdrome: Tired, foggy, washed out Associated symptoms: Nausea, photophobia, phonophobia, osmophobia, blurred vision. Left facial numbness when severe. No unilateral weakness. Initial Duration: Prior to [name], 2 days up to 2 weeks. Now 4 hours with sumatriptan 50mg . Initial Frequency: Prior to [name], 20+ days per month. Now 10 days per month (worse the week prior to next injection). Initial Frequency of abortive medication: as needed Triggers: Artificial sweeteners, tea, raw onions, perfumes/lotions, skipped meals, decreased sleep, change in weather Relieving factors: Rest Activity: Aggravates. Cannot function about 2 to 3 days a month.  She started having intractable migraines with status migrainosus following a bout of COVID-related pneumonia and tachycardia in 2021. Over this past year, she has had increased intractable migraines. She started first Vyepti infusion on 12/10/2019.  She had multiple ED visits.  Headache cocktail and prednisone taper provided only short-term relief.  Due to continued intractable chronic migraines, she was placed on short-term disability.  MRI of brain with and without contrast on 01/15/2020 was normal.  She was started on Vyepti  Past NSAIDS: ibuprofen, naproxen"min-prophylaxis" 2 weeks prior to next Emgality shot, diclofenac  50mg ,[place], naproxen Past analgesics: Tylenol Past abortive triptans: Sumatriptan 50 mg (ineffective),Sumatriptan patch (effective),[name]/[name] (rapid heartbeat, flushing), [name] 5mg  NS (ineffective) Past ergotamine: Migranal (  nose bleeds),Zembrace SymTouch(side effects) Past muscle relaxants: no Past anti-emetic: no Past antihypertensive medications: propranolol (initially effective for many years), verapamil Past antidepressant medications: Nortriptyline, amitriptyline, escitalopram Past anticonvulsant medications: gabapentin Past anti--CGRP: [name], Emgality, Ubrelvy Past vitamins/Herbal/Supplements: no Past antihistamines/decongestants: no Other past therapies: Trigger point injections/nerve blocks; Reyvow (rescue)  Family history of headache: Mother, maternal grandmother  03/22/15: Sed Rate 3  PAST MEDICAL HISTORY: Past Medical History:  Diagnosis Date  . Arthritis    OA in hands, hip and feet  . Chronic sinusitis   . Complication of anesthesia   . Depression   . GERD (gastroesophageal reflux disease)   . Joint pain   . Migraine   . PONV (postoperative nausea and vomiting)     MEDICATIONS: Current Outpatient Medications on File Prior to Visit  Medication Sig Dispense Refill  . albuterol ([name];[name]) 108 (90 Base) MCG/ACT inhaler     . baclofen ([name]) 10 MG tablet TAKE 1 TABLET(10 MG) BY MOUTH THREE TIMES DAILY AS NEEDED FOR MUSCLE SPASMS 30 tablet 1  . Cholecalciferol ([name]) 2000 units CAPS Take 1 capsule by mouth daily.    [name] Kitchen co-enzyme Q-10 50 MG capsule Take 100 mg by mouth daily.    . diclofenac (VOLTAREN) 50 MG EC tablet TAKE 1 TABLET BY MOUTH EVERY DAY AS NEEDED 30 tablet 2  . DULoxetine (CYMBALTA) 60 MG capsule Take 60 mg by mouth daily.    [name] Kitchen esomeprazole ([name]) 40 MG capsule Take 40 mg by mouth daily at 12 noon.    . famotidine (PEPCID) 40 MG tablet Take 40 mg by mouth daily.    . magnesium oxide ([name]) 400 MG  tablet Take 400 mg by mouth daily.    . NURTEC 75 MG TBDP TAKE 1 TABLET BY MOUTH EVERY DAY AS NEEDED 8 tablet 3  . ondansetron (ZOFRAN) 8 MG tablet Take 8 mg by mouth as needed for nausea or vomiting.    . OSPHENA 60 MG TABS Take 1 tablet by mouth 3 (three) times a week.    . predniSONE (STERAPRED UNI-PAK 21 TAB) 10 MG (21) TBPK tablet take 60mg  day 1, then 50mg  day 2, then 40mg  day 3, then 30mg  day 4, then 20mg  day 5, then 10mg  day 6, then STOP 21 tablet 0  . topiramate ER ([name]) 200 MG CS24 sprinkle capsule TAKE ONE CAPSULE BY MOUTH AT BEDTIME 30 capsule 2  . triamcinolone cream (KENALOG) 0.1 % Apply 1 application topically as needed.     No current facility-administered medications on file prior to visit.    ALLERGIES: Allergies  Allergen Reactions  . Aspirin Hives  . Ibuprofen Hives  . 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 Sulfa Antibiotics Hives  . Triptans Other (See Comments)    Sensitivity to injectable triptan drugs per pt  . Zithromax [Azithromycin Dihydrate] Hives  . Dilaudid [Hydromorphone Hcl] Palpitations    FAMILY HISTORY: Family History  Problem Relation Age of Onset  . Diabetes Mother   . Diabetes Father   . Heart attack Father   . Hypertension Father   . Rheum arthritis Father   . Diabetes Maternal Grandfather   . Heart attack Maternal Grandfather   . Hypertension Maternal Grandfather   . Diabetes Paternal Grandfather   . Heart attack Paternal Grandfather   . Hypertension Paternal Grandfather   . High blood pressure Brother   . Diabetes Brother   . Asthma Son   .  GER disease Son   . Rheum arthritis Daughter   . Migraines Daughter       Objective:  Blood pressure (!) 146/96, pulse (!) 115, height 5\' 5"  (1.651 m), weight 237 lb 9.6 oz (107.8 kg), SpO2 100 %. General: No acute distress.  Patient appears well-groomed.    Metta Clines, DO  CC: Lennie Odor, PA

## 2020-05-11 ENCOUNTER — Encounter: Payer: Self-pay | Admitting: Neurology

## 2020-05-11 ENCOUNTER — Ambulatory Visit (INDEPENDENT_AMBULATORY_CARE_PROVIDER_SITE_OTHER): Payer: BC Managed Care – PPO | Admitting: Neurology

## 2020-05-11 ENCOUNTER — Other Ambulatory Visit: Payer: Self-pay

## 2020-05-11 VITALS — BP 146/96 | HR 115 | Ht 65.0 in | Wt 237.6 lb

## 2020-05-11 DIAGNOSIS — M797 Fibromyalgia: Secondary | ICD-10-CM | POA: Diagnosis not present

## 2020-05-11 DIAGNOSIS — G43709 Chronic migraine without aura, not intractable, without status migrainosus: Secondary | ICD-10-CM | POA: Insufficient documentation

## 2020-05-11 DIAGNOSIS — G250 Essential tremor: Secondary | ICD-10-CM

## 2020-05-11 DIAGNOSIS — F32A Depression, unspecified: Secondary | ICD-10-CM

## 2020-05-11 DIAGNOSIS — G43009 Migraine without aura, not intractable, without status migrainosus: Secondary | ICD-10-CM

## 2020-05-11 MED ORDER — TOPIRAMATE ER 200 MG PO SPRINKLE CAP24
200.0000 mg | EXTENDED_RELEASE_CAPSULE | Freq: Every day | ORAL | 5 refills | Status: DC
Start: 2020-05-11 — End: 2020-07-25

## 2020-05-11 NOTE — Patient Instructions (Signed)
  1. Continue Vyepti 300mg  every 3 months, [name] 200mg  daily 2. Take Nurtec at earliest onset of headache.  Maximum 1 tablet in 24 hours.  Use Zofran for nausea as needed. 3. Limit use of pain relievers to no more than 2 days out of the week.  These medications include acetaminophen, NSAIDs (ibuprofen/Advil/Motrin, naproxen/Aleve, triptans ([name]/sumatriptan), Excedrin, and narcotics.  This will help reduce risk of rebound headaches. 4. Be aware of common food triggers:  - Caffeine:  coffee, black tea, cola, Mt. Dew  - Chocolate  - Dairy:  aged cheeses (brie, blue, cheddar, gouda, [place], provolone, [place], Swiss, etc), chocolate milk, buttermilk, sour cream, limit eggs and yogurt  - Nuts, peanut butter  - Alcohol  - Cereals/grains:  FRESH breads (fresh bagels, sourdough, doughnuts), yeast productions  - Processed/canned/aged/cured meats (pre-packaged deli meats, hotdogs)  - MSG/glutamate:  soy sauce, flavor enhancer, pickled/preserved/marinated foods  - Sweeteners:  aspartame (Equal, Nutrasweet).  Sugar and Splenda are okay  - Vegetables:  legumes (lima beans, lentils, snow peas, fava beans, pinto peans, peas, garbanzo beans), sauerkraut, onions, olives, pickles  - Fruit:  avocados, bananas, citrus fruit (orange, lemon, grapefruit), mango  - Other:  Frozen meals, macaroni and cheese 5. Routine exercise 6. Stay adequately hydrated (aim for 64 oz water daily) 7. Keep headache diary 8. Maintain proper stress management 9. Maintain proper sleep hygiene 10. Do not skip meals 11. Monitor tremor

## 2020-05-17 DIAGNOSIS — M7062 Trochanteric bursitis, left hip: Secondary | ICD-10-CM | POA: Diagnosis not present

## 2020-05-17 DIAGNOSIS — M797 Fibromyalgia: Secondary | ICD-10-CM | POA: Diagnosis not present

## 2020-05-17 DIAGNOSIS — M7061 Trochanteric bursitis, right hip: Secondary | ICD-10-CM | POA: Diagnosis not present

## 2020-05-22 ENCOUNTER — Other Ambulatory Visit: Payer: Self-pay | Admitting: Neurology

## 2020-05-22 DIAGNOSIS — R0789 Other chest pain: Secondary | ICD-10-CM

## 2020-05-25 DIAGNOSIS — G43119 Migraine with aura, intractable, without status migrainosus: Secondary | ICD-10-CM | POA: Diagnosis not present

## 2020-05-25 NOTE — Progress Notes (Deleted)
02/21/20- 61 yoF former smoker for sleep evaluation courtesy of Dr Metta Clines Neurology with concern of daytime somnolence. Medical problem list includes Migraine, Chronic Sinusitis, GERD, Covid infection Oct, 2020,  Depression,  Had seen Dr Lake Bells / Pulmonary in 2018 for patent foramen ovale> cardiology, and pulmonary AV malformation> CTa chest for May, 2019. Epworth score Body weight today- Covid vax- Flu vax- Complains of snoring, extreme fatigue, morning headaches. Several family members use CPAP. Benadryl for sleep. 2 cups AM coffee. ENT + 2 sinus surgeries. Occasional bronchitis- keeps an inhaler. Prior pneumonia, including Covid pneumonia in Oct, 2020 without ventilator. Cardiology told her nothing needed doing now about patent foramen ovale.   Denies complex parasomnias. CXR 1V- 04/05/19- IMPRESSION: Lower lung volumes, otherwise negative portable chest. CTa chest 06/26/17-  IMPRESSION: 1. Solitary small pulmonary AVM in the subpleural right middle lobe. A single feeding and draining vessel is present. No progression compared to 1 year prior. 2. History of PFO.  No cardiomegaly. 3. Possible hepatic steatosis.  05/26/20- 54 yoF former smoker followed for OSA with hypersomnolence, complicated by Migraine, Chronic Sinusitis, GERD, Covid infection Oct, 2020,  Depression, Pulmonary AVM, Fibromyalgia,  Had seen Dr Lake Bells / Pulmonary in 2018 for patent foramen ovale> cardiology, and pulmonary AV malformation> CTa chest for May, 2019. Referred by Dr Tomi Likens Neurology. HST 03/28/20- AHI 8.4/ hr, desaturation to 68%/ avg 94%, body weight 239 lbs Referred to Augustina Mood, DDS 2/17 for oral appliance. Body weight today- Covid vax- Flu vax-   ROS-see HPI   + = positive Constitutional:    weight loss, night sweats, fevers, chills, fatigue, lassitude. HEENT:    headaches, difficulty swallowing, tooth/dental problems, sore throat,       sneezing, itching, ear ache, nasal congestion, post nasal  drip, snoring CV:    chest pain, orthopnea, PND, swelling in lower extremities, anasarca,                                  dizziness, palpitations Resp:   shortness of breath with exertion or at rest.                productive cough,   non-productive cough, coughing up of blood.              change in color of mucus.  wheezing.   Skin:    rash or lesions. GI:  No-   heartburn, indigestion, abdominal pain, nausea, vomiting, diarrhea,                 change in bowel habits, loss of appetite GU: dysuria, change in color of urine, no urgency or frequency.   flank pain. MS:   joint pain, stiffness, decreased range of motion, back pain. Neuro-     nothing unusual Psych:  change in mood or affect.  depression or anxiety.   memory loss.  OBJ- Physical Exam General- Alert, Oriented, Affect-appropriate, Distress- none acute, + obese Skin- rash-none, lesions- none, excoriation- none Lymphadenopathy- none Head- atraumatic            Eyes- Gross vision intact, PERRLA, conjunctivae and secretions clear            Ears- Hearing, canals-normal            Nose- Clear, no-Septal dev, mucus, polyps, erosion, perforation             Throat- Mallampati III-IV , mucosa clear , drainage- none,  tonsils- atrophic, + teeth Neck- flexible , trachea midline, no stridor , thyroid nl, carotid no bruit Chest - symmetrical excursion , unlabored           Heart/CV- RRR , no murmur , no gallop  , no rub, nl s1 s2                           - JVD- none , edema- none, stasis changes- none, varices- none           Lung- clear to P&A, wheeze- none, cough- none , dullness-none, rub- none           Chest wall-  Abd-  Br/ Gen/ Rectal- Not done, not indicated Extrem- cyanosis- none, clubbing, none, atrophy- none, strength- nl Neuro- grossly intact to observation

## 2020-05-26 ENCOUNTER — Ambulatory Visit: Payer: BC Managed Care – PPO | Admitting: Internal Medicine

## 2020-05-29 DIAGNOSIS — M7061 Trochanteric bursitis, right hip: Secondary | ICD-10-CM | POA: Diagnosis not present

## 2020-05-29 DIAGNOSIS — M7062 Trochanteric bursitis, left hip: Secondary | ICD-10-CM | POA: Diagnosis not present

## 2020-05-29 DIAGNOSIS — M797 Fibromyalgia: Secondary | ICD-10-CM | POA: Diagnosis not present

## 2020-05-30 DIAGNOSIS — G4733 Obstructive sleep apnea (adult) (pediatric): Secondary | ICD-10-CM | POA: Diagnosis not present

## 2020-06-05 DIAGNOSIS — M797 Fibromyalgia: Secondary | ICD-10-CM | POA: Diagnosis not present

## 2020-06-05 DIAGNOSIS — R399 Unspecified symptoms and signs involving the genitourinary system: Secondary | ICD-10-CM | POA: Diagnosis not present

## 2020-06-05 DIAGNOSIS — F339 Major depressive disorder, recurrent, unspecified: Secondary | ICD-10-CM | POA: Diagnosis not present

## 2020-06-13 DIAGNOSIS — M7062 Trochanteric bursitis, left hip: Secondary | ICD-10-CM | POA: Diagnosis not present

## 2020-06-13 DIAGNOSIS — M7061 Trochanteric bursitis, right hip: Secondary | ICD-10-CM | POA: Diagnosis not present

## 2020-06-13 DIAGNOSIS — M797 Fibromyalgia: Secondary | ICD-10-CM | POA: Diagnosis not present

## 2020-06-19 DIAGNOSIS — M7062 Trochanteric bursitis, left hip: Secondary | ICD-10-CM | POA: Diagnosis not present

## 2020-06-19 DIAGNOSIS — M797 Fibromyalgia: Secondary | ICD-10-CM | POA: Diagnosis not present

## 2020-06-19 DIAGNOSIS — M7061 Trochanteric bursitis, right hip: Secondary | ICD-10-CM | POA: Diagnosis not present

## 2020-06-20 DIAGNOSIS — H9313 Tinnitus, bilateral: Secondary | ICD-10-CM | POA: Diagnosis not present

## 2020-06-20 DIAGNOSIS — R000 Tachycardia, unspecified: Secondary | ICD-10-CM | POA: Diagnosis not present

## 2020-06-20 DIAGNOSIS — M25552 Pain in left hip: Secondary | ICD-10-CM | POA: Diagnosis not present

## 2020-06-20 DIAGNOSIS — M25551 Pain in right hip: Secondary | ICD-10-CM | POA: Diagnosis not present

## 2020-06-27 ENCOUNTER — Ambulatory Visit: Payer: BC Managed Care – PPO | Admitting: Internal Medicine

## 2020-07-03 DIAGNOSIS — M797 Fibromyalgia: Secondary | ICD-10-CM | POA: Diagnosis not present

## 2020-07-03 DIAGNOSIS — F339 Major depressive disorder, recurrent, unspecified: Secondary | ICD-10-CM | POA: Diagnosis not present

## 2020-07-03 DIAGNOSIS — K219 Gastro-esophageal reflux disease without esophagitis: Secondary | ICD-10-CM | POA: Diagnosis not present

## 2020-07-03 DIAGNOSIS — L308 Other specified dermatitis: Secondary | ICD-10-CM | POA: Diagnosis not present

## 2020-07-05 DIAGNOSIS — F331 Major depressive disorder, recurrent, moderate: Secondary | ICD-10-CM | POA: Diagnosis not present

## 2020-07-10 DIAGNOSIS — Z20822 Contact with and (suspected) exposure to covid-19: Secondary | ICD-10-CM | POA: Diagnosis not present

## 2020-07-10 DIAGNOSIS — J069 Acute upper respiratory infection, unspecified: Secondary | ICD-10-CM | POA: Diagnosis not present

## 2020-07-10 DIAGNOSIS — R519 Headache, unspecified: Secondary | ICD-10-CM | POA: Diagnosis not present

## 2020-07-10 DIAGNOSIS — J029 Acute pharyngitis, unspecified: Secondary | ICD-10-CM | POA: Diagnosis not present

## 2020-07-12 ENCOUNTER — Ambulatory Visit: Payer: BC Managed Care – PPO | Attending: Critical Care Medicine

## 2020-07-12 DIAGNOSIS — Z20822 Contact with and (suspected) exposure to covid-19: Secondary | ICD-10-CM | POA: Diagnosis not present

## 2020-07-13 LAB — SARS-COV-2, NAA 2 DAY TAT

## 2020-07-13 LAB — NOVEL CORONAVIRUS, NAA: SARS-CoV-2, NAA: DETECTED — AB

## 2020-07-14 ENCOUNTER — Emergency Department (HOSPITAL_COMMUNITY)
Admission: EM | Admit: 2020-07-14 | Discharge: 2020-07-15 | Disposition: A | Discharge status: Home / Self Care | Payer: BC Managed Care – PPO | Attending: Emergency Medicine | Admitting: Emergency Medicine

## 2020-07-14 ENCOUNTER — Emergency Department (HOSPITAL_COMMUNITY): Payer: BC Managed Care – PPO

## 2020-07-14 ENCOUNTER — Encounter (HOSPITAL_COMMUNITY): Payer: Self-pay | Admitting: *Deleted

## 2020-07-14 ENCOUNTER — Other Ambulatory Visit: Payer: Self-pay

## 2020-07-14 DIAGNOSIS — R0602 Shortness of breath: Secondary | ICD-10-CM | POA: Diagnosis not present

## 2020-07-14 DIAGNOSIS — R0789 Other chest pain: Secondary | ICD-10-CM | POA: Diagnosis not present

## 2020-07-14 DIAGNOSIS — U071 COVID-19: Secondary | ICD-10-CM | POA: Diagnosis not present

## 2020-07-14 DIAGNOSIS — R079 Chest pain, unspecified: Secondary | ICD-10-CM

## 2020-07-14 DIAGNOSIS — Z8616 Personal history of COVID-19: Secondary | ICD-10-CM | POA: Insufficient documentation

## 2020-07-14 DIAGNOSIS — Z87891 Personal history of nicotine dependence: Secondary | ICD-10-CM | POA: Insufficient documentation

## 2020-07-14 LAB — CBC
HCT: 44 % (ref 36.0–46.0)
Hemoglobin: 14.2 g/dL (ref 12.0–15.0)
MCH: 30.1 pg (ref 26.0–34.0)
MCHC: 32.3 g/dL (ref 30.0–36.0)
MCV: 93.2 fL (ref 80.0–100.0)
Platelets: 283 10*3/uL (ref 150–400)
RBC: 4.72 MIL/uL (ref 3.87–5.11)
RDW: 12.6 % (ref 11.5–15.5)
WBC: 5.9 10*3/uL (ref 4.0–10.5)
nRBC: 0 % (ref 0.0–0.2)

## 2020-07-14 LAB — TROPONIN I (HIGH SENSITIVITY)
Troponin I (High Sensitivity): <2 ng/L (ref <18)
Troponin I (High Sensitivity): <2 ng/L (ref <18)

## 2020-07-14 LAB — BASIC METABOLIC PANEL
Anion gap: 6 (ref 5–15)
BUN: 26 mg/dL — ABNORMAL HIGH (ref 6–20)
CO2: 22 mmol/L (ref 22–32)
Calcium: 9.5 mg/dL (ref 8.9–10.3)
Chloride: 110 mmol/L (ref 98–111)
Creatinine, Ser: 1.21 mg/dL — ABNORMAL HIGH (ref 0.44–1.00)
GFR, Estimated: 53 mL/min — ABNORMAL LOW (ref >60)
Glucose, Bld: 97 mg/dL (ref 70–99)
Potassium: 4.1 mmol/L (ref 3.5–5.1)
Sodium: 138 mmol/L (ref 135–145)

## 2020-07-14 MED ORDER — FAMOTIDINE IN NACL 20-0.9 MG/50ML-% IV SOLN
20.0000 mg | Freq: Once | INTRAVENOUS | Status: DC | PRN
  Filled 2020-07-14: qty 50

## 2020-07-14 MED ORDER — SODIUM CHLORIDE 0.9 % IV SOLN: INTRAVENOUS | Status: DC | PRN

## 2020-07-14 MED ORDER — METHYLPREDNISOLONE SODIUM SUCC 125 MG IJ SOLR: 125.0000 mg | Freq: Once | INTRAMUSCULAR | Status: DC | PRN

## 2020-07-14 MED ORDER — DIPHENHYDRAMINE HCL 50 MG/ML IJ SOLN: 50.0000 mg | Freq: Once | INTRAMUSCULAR | Status: DC | PRN

## 2020-07-14 MED ORDER — BEBTELOVIMAB 175 MG/2 ML IV (EUA)
175.0000 mg | Freq: Once | INTRAMUSCULAR | Status: AC
  Administered 2020-07-14: 175 mg via INTRAVENOUS
  Filled 2020-07-14 (×2): qty 2

## 2020-07-14 MED ORDER — EPINEPHRINE 0.3 MG/0.3ML IJ SOAJ: 0.3000 mg | Freq: Once | INTRAMUSCULAR | Status: DC | PRN

## 2020-07-14 MED ORDER — ALBUTEROL SULFATE HFA 108 (90 BASE) MCG/ACT IN AERS: 2.0000 | INHALATION_SPRAY | Freq: Once | RESPIRATORY_TRACT | Status: DC | PRN

## 2020-07-14 NOTE — ED Provider Notes (Signed)
[facility] EMERGENCY DEPARTMENT Provider Note   CSN: [id number] Arrival date & time: 07/14/20  1524     History Chief Complaint  Patient presents with  . Chest Pain    Covid +    Jody Blackwell is a 55 y.o. female.  HPI  Presents with chest pain.  On day 5 of COVID-19 illness.  She is vaccinated.  No shortness of breath.  Has a sharp pain that moves to the back.  She has been coughing a lot and associates the pain with a cough although she also experiences it at other times as well.  Pain is worse with a deep breath.  When she previously had COVID she had "double pneumonia" so she discussed this finding today with her doctor who recommended she get evaluated for this.  No history of blood clot.  No history of ACS or CAD.  No estrogen use.  No focal leg swelling or leg pain.       Past Medical History:  Diagnosis Date  . Arthritis    OA in hands, hip and feet  . Chronic sinusitis   . Complication of anesthesia   . Depression   . GERD (gastroesophageal reflux disease)   . Joint pain   . Migraine   . PONV (postoperative nausea and vomiting)     Patient Active Problem List   Diagnosis Date Noted  . Chronic migraine without aura without status migrainosus, not intractable 05/11/2020  . Snoring 02/21/2020  . Morbid obesity due to excess calories ([place]) 02/21/2020  . COVID-19 virus infection 04/06/2019  . Peroneal tendon tear 07/20/2015  . Left ankle pain 08/03/2010  . Left shoulder pain 06/29/2010    Past Surgical History:  Procedure Laterality Date  . ABDOMINAL HYSTERECTOMY    . ANKLE RECONSTRUCTION Right 07/20/2015   Procedure: OPEN EXPLORATION OF LATERAL RIGHT PERONEUS BREVIS TENDON WITH REPAIR ;  Surgeon: Garald Balding, MD;  Location: [facility];  Service: Orthopedics;  Laterality: Right;  . CESAREAN SECTION     x 2  . CHOLECYSTECTOMY  2012   lap choli  . MASS EXCISION  06/03/2011   Procedure: EXCISION MASS;  Surgeon:  Schuyler Amor, MD;  Location: [facility];  Service: Orthopedics;  Laterality: Right;  EXCISION MASS RIGHT MIDDLE FINGER   . NASAL SINUS SURGERY  2007, 2010     OB History   No obstetric history on file.     Family History  Problem Relation Age of Onset  . Diabetes Mother   . Diabetes Father   . Heart attack Father   . Hypertension Father   . Rheum arthritis Father   . Diabetes Maternal Grandfather   . Heart attack Maternal Grandfather   . Hypertension Maternal Grandfather   . Diabetes Paternal Grandfather   . Heart attack Paternal Grandfather   . Hypertension Paternal Grandfather   . High blood pressure Brother   . Diabetes Brother   . Asthma Son   . GER disease Son   . Rheum arthritis Daughter   . Migraines Daughter     Social History   Tobacco Use  . Smoking status: Former Smoker    Packs/day: 0.25    Years: 5.00    Pack years: 1.25    Types: Cigarettes    Quit date: 05/30/1989    Years since quitting: 31.1  . Smokeless tobacco: Never Used  . Tobacco comment: social smoker per pt  Vaping Use  .  Vaping Use: Never used  Substance Use Topics  . Alcohol use: Not Currently    Comment: occasionally  . Drug use: No    Home Medications Prior to Admission medications   Medication Sig Start Date End Date Taking? Authorizing Provider  albuterol ([name];[name]) 108 (90 Base) MCG/ACT inhaler     [provider]  baclofen ([name]) 10 MG tablet TAKE 1 TABLET(10 MG) BY MOUTH THREE TIMES DAILY AS NEEDED FOR MUSCLE SPASMS 12/20/19   Tomi Likens, Adam R, DO  buPROPion (WELLBUTRIN XL) 150 MG 24 hr tablet Take 150 mg by mouth every morning. 04/28/20   [provider]  Cholecalciferol ([name]) 2000 units CAPS Take 1 capsule by mouth daily.    [provider]  co-enzyme Q-10 50 MG capsule Take 100 mg by mouth daily.    [provider]  diclofenac (VOLTAREN) 50 MG EC tablet TAKE 1 TABLET BY MOUTH EVERY DAY AS NEEDED  05/22/20   Pieter Partridge, DO  DULoxetine (CYMBALTA) 60 MG capsule Take 60 mg by mouth daily. 03/17/19   [provider]  esomeprazole ([name]) 40 MG capsule Take 40 mg by mouth daily at 12 noon.    [provider]  famotidine (PEPCID) 40 MG tablet Take 40 mg by mouth daily. 03/23/19   [provider]  magnesium oxide ([name]) 400 MG tablet Take 400 mg by mouth daily.    [provider]  NURTEC 75 MG TBDP TAKE 1 TABLET BY MOUTH EVERY DAY AS NEEDED 01/13/20   Tomi Likens, Adam R, DO  ondansetron (ZOFRAN) 8 MG tablet Take 8 mg by mouth as needed for nausea or vomiting.    [provider]  topiramate ER ([name]) 200 MG CS24 sprinkle capsule Take 1 capsule (200 mg total) by mouth at bedtime. 05/11/20   Pieter Partridge, DO  triamcinolone cream (KENALOG) 0.1 % Apply 1 application topically as needed.    [provider]    Allergies    Aspirin, Ibuprofen, Metoclopramide hcl, Sulfa antibiotics, Triptans, Zithromax [azithromycin dihydrate], and Dilaudid [hydromorphone hcl]  Review of Systems   Review of Systems  Constitutional: Positive for chills. Negative for fever.  HENT: Negative for ear pain and sore throat.   Eyes: Negative for pain and visual disturbance.  Respiratory: Positive for cough. Negative for shortness of breath.   Cardiovascular: Positive for chest pain. Negative for palpitations.  Gastrointestinal: Negative for abdominal pain and vomiting.  Genitourinary: Negative for dysuria and hematuria.  Musculoskeletal: Negative for arthralgias and back pain.  Skin: Negative for color change and rash.  Neurological: Negative for seizures and syncope.  All other systems reviewed and are negative.   Physical Exam Updated Vital Signs BP 131/80   Pulse 78   Temp 99 F (37.2 C) (Oral)   Resp 20   Ht 5\' 5"  (1.651 m)   Wt 107.8 kg   LMP  (LMP Unknown)   SpO2 98%   BMI 39.55 kg/m   Physical Exam Vitals and nursing note reviewed.   Constitutional:      General: She is not in acute distress.    Appearance: She is well-developed.  HENT:     Head: Normocephalic and atraumatic.  Eyes:     Extraocular Movements: Extraocular movements intact.     Conjunctiva/sclera: Conjunctivae normal.  Cardiovascular:     Rate and Rhythm: Normal rate and regular rhythm.     Heart sounds: No murmur heard.   Pulmonary:     Effort:  Pulmonary effort is normal. No respiratory distress.     Breath sounds: Normal breath sounds. No decreased breath sounds or wheezing.  Abdominal:     Palpations: Abdomen is soft.     Tenderness: There is no abdominal tenderness.  Musculoskeletal:     Cervical back: Normal range of motion and neck supple.     Right lower leg: No tenderness. No edema.     Left lower leg: No tenderness. No edema.  Skin:    General: Skin is warm and dry.  Neurological:     General: No focal deficit present.     Mental Status: She is alert.     Motor: No weakness.  Psychiatric:        Behavior: Behavior normal. Behavior is not agitated.     ED Results / Procedures / Treatments   Labs (all labs ordered are listed, but only abnormal results are displayed) Labs Reviewed  BASIC METABOLIC PANEL - Abnormal; Notable for the following components:      Result Value   BUN 26 (*)    Creatinine, Ser 1.21 (*)    GFR, Estimated 53 (*)    All other components within normal limits  CBC  TROPONIN I (HIGH SENSITIVITY)  TROPONIN I (HIGH SENSITIVITY)    EKG EKG Interpretation  Date/Time:  Friday Jul 14 2020 16:39:13 EDT Ventricular Rate:  101 PR Interval:  136 QRS Duration: 78 QT Interval:  364 QTC Calculation: 471 R Axis:   3 Text Interpretation: Sinus tachycardia Nonspecific T wave abnormality Abnormal ECG Confirmed by Merrily Pew [id number]) on 07/14/2020 9:23:08 PM   Radiology DG Chest Port 1 View  Result Date: 07/14/2020 CLINICAL DATA:  Chest pain and shortness of breath for 6 days. Covid-19 viral infection.  EXAM: PORTABLE CHEST 1 VIEW COMPARISON:  04/05/2019 FINDINGS: The heart size and mediastinal contours are within normal limits. Mild scarring noted in the lateral left lung base. No evidence of pulmonary infiltrate or edema. No evidence of pleural effusion. The visualized skeletal structures are unremarkable. IMPRESSION: No active disease. Electronically Signed   By: Marlaine Hind M.D.   On: 07/14/2020 18:09    Procedures Procedures    Medications Ordered in ED Medications  0.9 %  sodium chloride infusion (has no administration in time range)  diphenhydrAMINE (BENADRYL) injection 50 mg (has no administration in time range)  famotidine (PEPCID) IVPB 20 mg premix (has no administration in time range)  methylPREDNISolone sodium succinate (SOLU-MEDROL) 125 mg/2 mL injection 125 mg (has no administration in time range)  albuterol ([name]) 108 (90 Base) MCG/ACT inhaler 2 puff (has no administration in time range)  EPINEPHrine (EPI-PEN) injection 0.3 mg (has no administration in time range)  bebtelovimab EUA injection SOLN 175 mg (175 mg Intravenous Given 07/14/20 2346)    ED Course  I have reviewed the triage vital signs and the nursing notes.  Pertinent labs & imaging results that were available during my care of the patient were reviewed by me and considered in my medical decision making (see chart for details).    MDM Rules/Calculators/A&P                           Patient presents with chest pain.  Chest pain is pleuritic in nature and sounds overall like pleurisy.  She has not short of breath.  She saturating 9% on room air.  She was tachycardic on arrival with her EKG very mildly to 101, but not  tachycardic for the remainder of her stay in the ED.  Tolerating food and water.  No overt risk factors for thromboembolic phenomenon with no history of blood clot, no estrogen use, no findings concerning for DVT on exam.  Initial and repeat troponins are negative so ACS is unlikely.  EKG  reviewed by me shows sinus tachycardia, no focal ischemic changes, no conduction delays.  Chest x-ray reviewed by me, no focal consolidations, no interstitial edema, no mediastinal widening.  Laboratory studies otherwise unremarkable.  Creatinine 1.21 and patient has history of CKD 3 according to her.  She qualifies for monoclonal antibody treatment which she opted for so she received this and was observed for a brief period and then discharged after tolerating it.  Return precautions provided.  Final Clinical Impression(s) / ED Diagnoses Final diagnoses:  Chest pain, unspecified type    Rx / DC Orders ED Discharge Orders    None       Aris Lot, MD 07/15/20 0127    Merrily Pew, MD 07/15/20 1505

## 2020-07-14 NOTE — ED Triage Notes (Signed)
Pt states began having covid s/s on Sunday.  Wed she tested positive for Covid.  Pt now c/o chest pain and sob on exertion.

## 2020-07-14 NOTE — Discharge Instructions (Addendum)
Please come back for chest pain, difficulty breathing, lightheadedness, passing out, or any other emergencies.  Otherwise follow-up with your doctor if her symptoms persist

## 2020-07-14 NOTE — ED Provider Notes (Signed)
Emergency Medicine Provider Triage Evaluation Note  Madora Barletta , a 55 y.o. female  was evaluated in triage.  Pt complains of COVID-positive on Wednesday, symptom onset on Sunday.  Feeling short of breath with chest pain.  History of COVID in 2020 with bilateral pneumonia.  Review of Systems  Positive: Chest pain, shortness of breath Negative: Vomiting  Physical Exam  BP (!) 145/98 (BP Location: Left Wrist)   Pulse (!) 101   Temp 98.1 F (36.7 C)   Resp 20   LMP  (LMP Unknown)   SpO2 99%  Gen:   Awake, no distress   Resp:  Normal effort  MSK:   Moves extremities without difficulty  Other:    Medical Decision Making  Medically screening exam initiated at 4:45 PM.  Appropriate orders placed.  Zyrah Wiswell Coberly was informed that the remainder of the evaluation will be completed by another provider, this initial triage assessment does not replace that evaluation, and the importance of remaining in the ED until their evaluation is complete.     Tacy Learn, PA-C 07/14/20 1653    Davonna Belling, MD 07/14/20 [phone]

## 2020-07-15 NOTE — ED Notes (Signed)
E-signature pad unavailable at time of pt discharge. This RN discussed discharge materials with pt and answered all pt questions. Pt stated understanding of discharge material. ? ?

## 2020-07-20 ENCOUNTER — Ambulatory Visit: Payer: BC Managed Care – PPO | Admitting: Internal Medicine

## 2020-07-21 NOTE — Progress Notes (Signed)
Virtual Visit via Video Note The purpose of this virtual visit is to provide medical care while limiting exposure to the novel coronavirus.    Consent was obtained for video visit:  Yes.   Answered questions that patient had about telehealth interaction:  Yes.   I discussed the limitations, risks, security and privacy concerns of performing an evaluation and management service by telemedicine. I also discussed with the patient that there may be a patient responsible charge related to this service. The patient expressed understanding and agreed to proceed.  Pt location: Home Physician Location: office Name of referring provider:  Lennie Odor, [place] I connected with Jody Blackwell at patients initiation/request on 07/25/2020 at  8:30 AM EDT by video enabled telemedicine application and verified that I am speaking with the correct person using two identifiers. Pt MRN:  [id number] Pt DOB:  1965-08-09 Video Participants:  Jody Blackwell  Assessment and Plan:   1.  Migraine without aura, without status migrainosus, not intractable - improved - overall she is averaging 50% less headache frequency 2.  Fibomyalgia 3.  Depression 4.  Essential tremor stable  1.  Migraine prevention:  Vyepti 300mg  Q62months, [name] 200mg  daily 2.  Migraine rescue:  Nurtec 3.  Limit use of pain relievers to no more than 2 days out of week to prevent risk of rebound or medication-overuse headache. 4.  Keep headache diary 5.  Follow recommendations for optimized sleep hygiene 6.  Monitor tremor 7.  She will look into finding a local neurologist.  Until then, I will continue refills and will have her follow up 6 months.  History of Present Illness:  Jody Blackwell is a69 year old woman with chronic pain syndrome, essential tremor, depression and GERD who follows up for migraines.  UPDATE: Status post 3 infusions of Vyepti 300mg .  Last infusion was on April. Headaches are severe, lasts a couple of  hours with Nurtec. March:  11 headache days April:  14 headache days May:  Contracted Covid.  18 headache days.  Would not respond to Nurtec. She has since moved to [facility] but will go back to [place] monthly for family.  Therefore, she will follow up with her current physicians for a little while.  She is still on short term disability due to her depression and fibromyalgia.  Current NSAIDS:diclofenac Current analgesics:None Current triptans:None Current ergotamine:none Current anti-emetic:Zofran 8 mg Current muscle relaxants:Baclofen Current anti-anxiolytic:None Current sleep aide:None Current Antihypertensive medications:None Current Antidepressant medications:Cymbalta60mg , Wellbutrin  Current Anticonvulsant medications:[name]200 mg at bedtime Current anti-CGRP:Vyepti 300mg  (second infusion Jan 6), Nurtec (rescue) Current Vitamins/Herbal/Supplements:Magnesium 400 mg, D Current Antihistamines/Decongestants:Benadryl QHS Other therapy:none Hormone:none   Depression:Yes; Anxiety:Yes. History of depression. Other pain:Severe fibromyalgia - unable to work due to the pain.  She is deciding if she will apply for disability. Sleep:  She had a sleep study in February 2022, which demonstrated mild OSA.  Conservative measures were recommended. [name] Kitchen  HISTORY: Onset: Her early 72s Location: right occipital Quality: pounding, like head is going to explode Initial Intensity: Severe prior to [place]. Now moderate and sometimes severe. Aura: Severe headache preceded by left tunnel vision for 15-20 minutes Prodrome: Generalized aches, tired Postdrome: Tired, foggy, washed out Associated symptoms: Nausea, photophobia, phonophobia, osmophobia, blurred vision. Left facial numbness when severe. No unilateral weakness. Initial Duration: Prior to [name], 2 days up to 2 weeks. Now 4 hours with sumatriptan 50mg . Initial Frequency: Prior to [name], 20+ days per month.  Now 10 days per month (worse the  week prior to next injection). Initial Frequency of abortive medication: as needed Triggers: Artificial sweeteners, tea, raw onions, perfumes/lotions, skipped meals, decreased sleep, change in weather Relieving factors: Rest Activity: Aggravates. Cannot function about 2 to 3 days a month.  She started having intractable migraines with status migrainosus following a bout of COVID-related pneumonia and tachycardia in 2021. Over this past year, she has had increased intractable migraines.She started first Vyepti infusionon 12/10/2019. She had multiple ED visits.  Headache cocktail and prednisone taper provided only short-term relief.  Due to continued intractable chronic migraines, she was placed on short-term disability. MRI of brain with and without contrast on 01/15/2020 was normal. She was started on Vyepti  Past NSAIDS: ibuprofen, naproxen"min-prophylaxis" 2 weeks prior to next Emgality shot, diclofenac 50mg ,[place], naproxen Past analgesics: Tylenol Past abortive triptans: Sumatriptan 50 mg (ineffective),Sumatriptan patch (effective),[name]/[name] (rapid heartbeat, flushing), [name] 5mg  NS (ineffective) Past ergotamine: Migranal (nose bleeds),Zembrace SymTouch(side effects) Past muscle relaxants: no Past anti-emetic: no Past antihypertensive medications: propranolol (initially effective for many years), verapamil Past antidepressant medications: Nortriptyline, amitriptyline, escitalopram Past anticonvulsant medications: gabapentin Past anti--CGRP: [name], Emgality,Ubrelvy Past vitamins/Herbal/Supplements: no Past antihistamines/decongestants: no Other past therapies: Trigger point injections/nerve blocks; Reyvow (rescue)  Family history of headache: Mother, maternal grandmother  03/22/15: Sed Rate 3  Past Medical History: Past Medical History:  Diagnosis Date  . Arthritis    OA in hands, hip and feet  . Chronic  sinusitis   . Complication of anesthesia   . Depression   . GERD (gastroesophageal reflux disease)   . Joint pain   . Migraine   . PONV (postoperative nausea and vomiting)     Medications: Outpatient Encounter Medications as of 07/25/2020  Medication Sig  . albuterol ([name];[name]) 108 (90 Base) MCG/ACT inhaler   . baclofen ([name]) 10 MG tablet TAKE 1 TABLET(10 MG) BY MOUTH THREE TIMES DAILY AS NEEDED FOR MUSCLE SPASMS  . buPROPion (WELLBUTRIN XL) 150 MG 24 hr tablet Take 150 mg by mouth every morning.  . Cholecalciferol ([name]) 2000 units CAPS Take 1 capsule by mouth daily.  [name] Kitchen co-enzyme Q-10 50 MG capsule Take 100 mg by mouth daily.  . diclofenac (VOLTAREN) 50 MG EC tablet TAKE 1 TABLET BY MOUTH EVERY DAY AS NEEDED  . DULoxetine (CYMBALTA) 60 MG capsule Take 60 mg by mouth daily.  [name] Kitchen esomeprazole ([name]) 40 MG capsule Take 40 mg by mouth daily at 12 noon.  . famotidine (PEPCID) 40 MG tablet Take 40 mg by mouth daily.  . magnesium oxide ([name]) 400 MG tablet Take 400 mg by mouth daily.  . NURTEC 75 MG TBDP TAKE 1 TABLET BY MOUTH EVERY DAY AS NEEDED  . ondansetron (ZOFRAN) 8 MG tablet Take 8 mg by mouth as needed for nausea or vomiting.  . topiramate ER ([name]) 200 MG CS24 sprinkle capsule Take 1 capsule (200 mg total) by mouth at bedtime.  . triamcinolone cream (KENALOG) 0.1 % Apply 1 application topically as needed.   No facility-administered encounter medications on file as of 07/25/2020.    Allergies: Allergies  Allergen Reactions  . Aspirin Hives  . Ibuprofen Hives  . 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 Sulfa Antibiotics Hives  . Triptans Other (See Comments)    Sensitivity to injectable triptan drugs per pt  . Zithromax [Azithromycin Dihydrate] Hives  . Dilaudid [Hydromorphone Hcl] Palpitations    Family History: Family History  Problem Relation Age of Onset  . Diabetes  Mother   . Diabetes Father   . Heart  attack Father   . Hypertension Father   . Rheum arthritis Father   . Diabetes Maternal Grandfather   . Heart attack Maternal Grandfather   . Hypertension Maternal Grandfather   . Diabetes Paternal Grandfather   . Heart attack Paternal Grandfather   . Hypertension Paternal Grandfather   . High blood pressure Brother   . Diabetes Brother   . Asthma Son   . GER disease Son   . Rheum arthritis Daughter   . Migraines Daughter     Observations/Objective:   There were no vitals taken for this visit. No acute distress.  Alert and oriented.  Speech fluent and not dysarthric.  Language intact.     Follow Up Instructions:    -I discussed the assessment and treatment plan with the patient. The patient was provided an opportunity to ask questions and all were answered. The patient agreed with the plan and demonstrated an understanding of the instructions.   The patient was advised to call back or seek an in-person evaluation if the symptoms worsen or if the condition fails to improve as anticipated.   Dudley Major, DO

## 2020-07-25 ENCOUNTER — Telehealth (INDEPENDENT_AMBULATORY_CARE_PROVIDER_SITE_OTHER): Payer: BC Managed Care – PPO | Admitting: Neurology

## 2020-07-25 ENCOUNTER — Other Ambulatory Visit: Payer: Self-pay

## 2020-07-25 ENCOUNTER — Encounter: Payer: Self-pay | Admitting: Neurology

## 2020-07-25 DIAGNOSIS — G250 Essential tremor: Secondary | ICD-10-CM

## 2020-07-25 DIAGNOSIS — F32A Depression, unspecified: Secondary | ICD-10-CM | POA: Diagnosis not present

## 2020-07-25 DIAGNOSIS — M797 Fibromyalgia: Secondary | ICD-10-CM

## 2020-07-25 DIAGNOSIS — G43009 Migraine without aura, not intractable, without status migrainosus: Secondary | ICD-10-CM | POA: Diagnosis not present

## 2020-07-25 MED ORDER — NURTEC 75 MG PO TBDP: 1.0000 | ORAL_TABLET | Freq: Every day | ORAL | 3 refills | Status: AC | PRN

## 2020-07-25 MED ORDER — TOPIRAMATE ER 200 MG PO SPRINKLE CAP24: 200.0000 mg | EXTENDED_RELEASE_CAPSULE | Freq: Every day | ORAL | 5 refills | Status: AC

## 2020-07-28 ENCOUNTER — Other Ambulatory Visit: Payer: Self-pay

## 2020-07-28 ENCOUNTER — Ambulatory Visit: Payer: BC Managed Care – PPO | Admitting: Primary Care

## 2020-07-28 ENCOUNTER — Encounter: Payer: Self-pay | Admitting: Primary Care

## 2020-07-28 DIAGNOSIS — G4730 Sleep apnea, unspecified: Secondary | ICD-10-CM

## 2020-07-28 DIAGNOSIS — R0683 Snoring: Secondary | ICD-10-CM

## 2020-07-28 DIAGNOSIS — Z8774 Personal history of (corrected) congenital malformations of heart and circulatory system: Secondary | ICD-10-CM | POA: Insufficient documentation

## 2020-07-28 DIAGNOSIS — F331 Major depressive disorder, recurrent, moderate: Secondary | ICD-10-CM | POA: Diagnosis not present

## 2020-07-28 MED ORDER — PREDNISONE 10 MG PO TABS: ORAL_TABLET | ORAL | 0 refills | Status: AC

## 2020-07-28 NOTE — Assessment & Plan Note (Signed)
-   HST 03/28/20 showed mild OSA, average AHI 8.4/hr. Received oral appliance in May 2022 and reports significant improvement in snoring. She will have repeat sleep study with Dr. Toy Cookey. Follow-up with our office as needed only if sleep symptoms worsen

## 2020-07-28 NOTE — Assessment & Plan Note (Addendum)
-   Hx pulmonary AV malformation, CT chest in May 2019 showed solitary small pulmonary AVM in the subpleural right middle lobe.A single feeding and draining vessel is present. No progression compared to 1 year prior.

## 2020-07-28 NOTE — Progress Notes (Signed)
@Patient  ID: Jody Blackwell, female    DOB: 09/08/1965, 55 y.o.   MRN: [id number]  Chief Complaint  Patient presents with  . Follow-up    Pt was referred to Dr. Augustina Mood for oral appliance for the OSA and states that it is working very well for her and states that she does not snore anymore.    Referring provider: Lennie Odor, PA  HPI: 55 year old female, former smoker quit in April 1991 (1.25 pack year hx). PMH significant for snoring, obesity, covid-19. Former patient of Dr. Lake Bells, seen by Dr. Annamaria Boots for new patient consult on 02/21/20.   Previousl [facility] pulmonary encounter: 02/21/20- 20 yoF former smoker for sleep evaluation courtesy of Dr Metta Clines Neu[facility] with concern of daytime somnolence. Medical problem list includes Migraine, Chronic Sinusitis, GERD, Covid infection Oct, 2020,  Depression,  Had seen Dr Lake Bells / Pulmonary in 2018 for patent foramen ovale> cardiology, and pulmonary AV malformation> CTa chest for May, 2019. Epworth score Body weight today- Covid vax- Flu vax- Complains of snoring, extreme fatigue, morning headaches. Several family members use CPAP. Benadryl for sleep. 2 cups AM coffee. ENT + 2 sinus surgeries. Occasional bronchitis- keeps an inhaler. Prior pneumonia, including Covid pneumonia in Oct, 2020 without ventilator. Cardiology told her nothing needed doing now about patent foramen ovale.   Denies complex parasomnias. CXR 1V- 04/05/19- IMPRESSION: Lower lung volumes, otherwise negative portable chest. CTa chest 06/26/17-  IMPRESSION: 1. Solitary small pulmonary AVM in the subpleural right middle lobe. A single feeding and draining vessel is present. No progression compared to 1 year prior. 2. History of PFO. No cardiomegaly. 3. Possible hepatic steatosis.   07/28/2020 Patient presents today for 3 month follow-up. Ordered for sleep study during last visit.  HST 03/28/20 showed mild OSA, average 8.4 apneas an hour. Dr. Annamaria Boots recommended  starting auto cpap 51-5cm h20. Patient requested oral appliance d/t moving to [place]. Referral placed to Dr. Toy Cookey. She moved to [place] last  weeks ago. She returned for several apts and to see her family this weekend. She has been following with Dr. Toy Cookey. Her out of pocket was 400 dollars for oral appliance which she received in May. She is very happy with her results. She reports significant less snoring. Plan is to repeat sleep study with Dr. Toy Cookey. She has been having issues with her GERD, PCP reduced nexium and Pepcid. She is working on diet modifications. She has been referred to GI. She tested positive for covid for a second time on May 19th. She went to ED and she received antibody infusion. She has some pleuritic pain on left side with deep breath. CXR showed mild scarring left lung base, no active disease.  No other respiratory complaints.    Allergies  Allergen Reactions  . Aspirin Hives  . Ibuprofen Hives  . 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 Sulfa Antibiotics Hives  . Triptans Other (See Comments)    Sensitivity to injectable triptan drugs per pt  . Zithromax [Azithromycin Dihydrate] Hives  . Dilaudid [Hydromorphone Hcl] Palpitations    Immunization History  Administered Date(s) Administered  . Hepatitis B, ped/adol 05/08/1994, 06/07/1994, 11/08/1994  . Influenza Inj Mdck Quad Pf 12/02/2017  . Influenza Split 11/28/2011, 11/12/2012, 12/02/2013, 11/29/2014, 11/21/2015, 11/19/2016, 12/05/2017, 12/29/2018  . Influenza Whole 11/10/2019  . Influenza,inj,Quad PF,6+ Mos 12/29/2018  . Influenza-Unspecified 11/19/2016  . MMR 01/03/2006, 01/27/2006  . Moderna Sars-Covid-2 Vaccination 06/23/2019, 07/23/2019, 03/08/2020  .  Td 02/04/2012  . Tdap 01/03/2006  . Varicella 01/03/2006  . Zoster, Live 06/12/2017, 08/12/2017    Past Medical History:  Diagnosis Date  . Arthritis    OA in hands, hip and feet  . Chronic sinusitis   .  Complication of anesthesia   . Depression   . GERD (gastroesophageal reflux disease)   . Joint pain   . Migraine   . PONV (postoperative nausea and vomiting)     Tobacco History: Social History   Tobacco Use  Smoking Status Former Smoker  . Packs/day: 0.25  . Years: 5.00  . Pack years: 1.25  . Types: Cigarettes  . Quit date: 05/30/1989  . Years since quitting: 31.1  Smokeless Tobacco Never Used  Tobacco Comment   social smoker per pt   Counseling given: Not Answered Comment: social smoker per pt   Outpatient Medications Prior to Visit  Medication Sig Dispense Refill  . albuterol ([name];[name]) 108 (90 Base) MCG/ACT inhaler     . baclofen ([name]) 10 MG tablet TAKE 1 TABLET(10 MG) BY MOUTH THREE TIMES DAILY AS NEEDED FOR MUSCLE SPASMS 30 tablet 1  . buPROPion (WELLBUTRIN XL) 150 MG 24 hr tablet Take 150 mg by mouth every morning.    . Cholecalciferol ([name]) 2000 units CAPS Take 1 capsule by mouth daily.    [name] Kitchen co-enzyme Q-10 50 MG capsule Take 100 mg by mouth daily.    . diclofenac (VOLTAREN) 50 MG EC tablet TAKE 1 TABLET BY MOUTH EVERY DAY AS NEEDED 30 tablet 2  . DULoxetine (CYMBALTA) 60 MG capsule Take 60 mg by mouth daily.    [name] Kitchen Eptinezumab-jjmr (VYEPTI IV) Inject 300 mg into the vein every 3 (three) months.    . esomeprazole ([name]) 40 MG capsule Take 40 mg by mouth daily at 12 noon.    . famotidine (PEPCID) 40 MG tablet Take 40 mg by mouth daily.    . magnesium oxide ([name]) 400 MG tablet Take 400 mg by mouth daily.    . ondansetron (ZOFRAN) 8 MG tablet Take 8 mg by mouth as needed for nausea or vomiting.    . Rimegepant Sulfate (NURTEC) 75 MG TBDP Take 1 tablet by mouth daily as needed. 8 tablet 3  . topiramate ER ([name]) 200 MG CS24 sprinkle capsule Take 1 capsule (200 mg total) by mouth at bedtime. 30 capsule 5  . triamcinolone cream (KENALOG) 0.1 % Apply 1 application topically as needed.     No facility-administered medications prior to  visit.   Review of Systems  Review of Systems  Constitutional: Negative.   HENT: Negative.   Respiratory: Negative.   Psychiatric/Behavioral: Negative for sleep disturbance.   Physical Exam  BP 124/70 (BP Location: Left Wrist, Patient Position: Sitting, Cuff Size: Normal)   Pulse 91   Ht 5' 5"  (1.651 m)   Wt 230 lb 6.4 oz (104.5 kg)   LMP  (LMP Unknown)   SpO2 98% Comment: RA  BMI 38.34 kg/m  Physical Exam Constitutional:      Appearance: Normal appearance.  HENT:     Head: Normocephalic and atraumatic.  Cardiovascular:     Rate and Rhythm: Normal rate and regular rhythm.  Pulmonary:     Effort: Pulmonary effort is normal.     Breath sounds: Normal breath sounds.  Skin:    General: Skin is warm and dry.  Neurological:     General: No focal deficit present.     Mental Status: She is  alert and oriented to person, place, and time. Mental status is at baseline.  Psychiatric:        Mood and Affect: Mood normal.        Behavior: Behavior normal.        Thought Content: Thought content normal.        Judgment: Judgment normal.      Lab Results:  CBC    Component Value Date/Time   WBC 5.9 07/14/2020 1653   RBC 4.72 07/14/2020 1653   HGB 14.2 07/14/2020 1653   HCT 44.0 07/14/2020 1653   PLT 283 07/14/2020 1653   MCV 93.2 07/14/2020 1653   MCH 30.1 07/14/2020 1653   MCHC 32.3 07/14/2020 1653   RDW 12.6 07/14/2020 1653   LYMPHSABS 1.5 04/05/2019 1020   MONOABS 0.4 04/05/2019 1020   EOSABS 0.1 04/05/2019 1020   BASOSABS 0.1 04/05/2019 1020    BMET    Component Value Date/Time   NA 138 07/14/2020 1653   K 4.1 07/14/2020 1653   CL 110 07/14/2020 1653   CO2 22 07/14/2020 1653   GLUCOSE 97 07/14/2020 1653   BUN 26 (H) 07/14/2020 1653   CREATININE 1.21 (H) 07/14/2020 1653   CALCIUM 9.5 07/14/2020 1653   GFRNONAA 53 (L) 07/14/2020 1653   GFRAA >60 04/05/2019 1020    BNP No results found for: BNP  ProBNP    Component Value Date/Time   PROBNP 45.0  09/19/2016 1543    Imaging: DG Chest Port 1 View  Result Date: 07/14/2020 CLINICAL DATA:  Chest pain and shortness of breath for 6 days. Covid-19 viral infection. EXAM: PORTABLE CHEST 1 VIEW COMPARISON:  04/05/2019 FINDINGS: The heart size and mediastinal contours are within normal limits. Mild scarring noted in the lateral left lung base. No evidence of pulmonary infiltrate or edema. No evidence of pleural effusion. The visualized skeletal structures are unremarkable. IMPRESSION: No active disease. Electronically Signed   By: Marlaine Hind M.D.   On: 07/14/2020 18:09     Assessment & Plan:   Mild sleep apnea - HST 03/28/20 showed mild OSA, average AHI 8.4/hr. Received oral appliance in May 2022 and reports significant improvement in snoring. She will have repeat sleep study with Dr. Toy Cookey. Follow-up with our office as needed only if sleep symptoms worsen   Snoring - Patient reports mild left sided pleuritic chest pain since having covid. No other respiratory complaints. VSS. CXR 07/14/20 showed mild scarring left lung base, no active disease. RX prednisone taper as she can not take NSAIDS.   H/O arteriovenous malformation (AVM) - Hx pulmonary AV malformation, CT chest in May 2019 showed solitary small pulmonary AVM in the subpleural right middle lobe.A single feeding and draining vessel is present. No progression compared to 1 year prior.   Martyn Ehrich, NP 07/28/2020

## 2020-07-28 NOTE — Assessment & Plan Note (Addendum)
-   Patient reports mild left sided pleuritic chest pain since having covid. No other respiratory complaints. VSS. CXR 07/14/20 showed mild scarring left lung base, no active disease. RX prednisone taper as she can not take NSAIDS.

## 2020-07-28 NOTE — Patient Instructions (Signed)
Recommendations: - Continue to follow with Dr. Toy Cookey and wear oral appliance - Continue to work on weight loss efforts - Do not drive if experiencing excessive daytime fatigue or somnolence  - Take prednisone taper for pleurisy from recent covid   Rx: - Prednisone taper   Follow-up - As needed only if sleep symptoms worsen    Sleep Apnea Sleep apnea is a condition in which breathing pauses or becomes shallow during sleep. Episodes of sleep apnea usually last 10 seconds or longer, and they may occur as many as 20 times an hour. Sleep apnea disrupts your sleep and keeps your body from getting the rest that it needs. This condition can increase your risk of certain health problems, including:  Heart attack.  Stroke.  Obesity.  Diabetes.  Heart failure.  Irregular heartbeat. What are the causes? There are three kinds of sleep apnea:  Obstructive sleep apnea. This kind is caused by a blocked or collapsed airway.  Central sleep apnea. This kind happens when the part of the brain that controls breathing does not send the correct signals to the muscles that control breathing.  Mixed sleep apnea. This is a combination of obstructive and central sleep apnea. The most common cause of this condition is a collapsed or blocked airway. An airway can collapse or become blocked if:  Your throat muscles are abnormally relaxed.  Your tongue and tonsils are larger than normal.  You are overweight.  Your airway is smaller than normal.   What increases the risk? You are more likely to develop this condition if you:  Are overweight.  Smoke.  Have a smaller than normal airway.  Are elderly.  Are female.  Drink alcohol.  Take sedatives or tranquilizers.  Have a family history of sleep apnea. What are the signs or symptoms? Symptoms of this condition include:  Trouble staying asleep.  Daytime sleepiness and tiredness.  Irritability.  Loud snoring.  Morning  headaches.  Trouble concentrating.  Forgetfulness.  Decreased interest in sex.  Unexplained sleepiness.  Mood swings.  Personality changes.  Feelings of depression.  Waking up often during the night to urinate.  Dry mouth.  Sore throat. How is this diagnosed? This condition may be diagnosed with:  A medical history.  A physical exam.  A series of tests that are done while you are sleeping (sleep study). These tests are usually done in a sleep lab, but they may also be done at home. How is this treated? Treatment for this condition aims to restore normal breathing and to ease symptoms during sleep. It may involve managing health issues that can affect breathing, such as high blood pressure or obesity. Treatment may include:  Sleeping on your side.  Using a decongestant if you have nasal congestion.  Avoiding the use of depressants, including alcohol, sedatives, and narcotics.  Losing weight if you are overweight.  Making changes to your diet.  Quitting smoking.  Using a device to open your airway while you sleep, such as: ? An oral appliance. This is a custom-made mouthpiece that shifts your lower jaw forward. ? A continuous positive airway pressure (CPAP) device. This device blows air through a mask when you breathe out (exhale). ? A nasal expiratory positive airway pressure (EPAP) device. This device has valves that you put into each nostril. ? A bi-level positive airway pressure (BPAP) device. This device blows air through a mask when you breathe in (inhale) and breathe out (exhale).  Having surgery if other treatments do not  work. During surgery, excess tissue is removed to create a wider airway. It is important to get treatment for sleep apnea. Without treatment, this condition can lead to:  High blood pressure.  Coronary artery disease.  In men, an inability to achieve or maintain an erection (impotence).  Reduced thinking abilities.   Follow these  instructions at home: Lifestyle  Make any lifestyle changes that your health care provider recommends.  Eat a healthy, well-balanced diet.  Take steps to lose weight if you are overweight.  Avoid using depressants, including alcohol, sedatives, and narcotics.  Do not use any products that contain nicotine or tobacco, such as cigarettes, e-cigarettes, and chewing tobacco. If you need help quitting, ask your health care provider. General instructions  Take over-the-counter and prescription medicines only as told by your health care provider.  If you were given a device to open your airway while you sleep, use it only as told by your health care provider.  If you are having surgery, make sure to tell your health care provider you have sleep apnea. You may need to bring your device with you.  Keep all follow-up visits as told by your health care provider. This is important. Contact a health care provider if:  The device that you received to open your airway during sleep is uncomfortable or does not seem to be working.  Your symptoms do not improve.  Your symptoms get worse. Get help right away if:  You develop: ? Chest pain. ? Shortness of breath. ? Discomfort in your back, arms, or stomach.  You have: ? Trouble speaking. ? Weakness on one side of your body. ? Drooping in your face. These symptoms may represent a serious problem that is an emergency. Do not wait to see if the symptoms will go away. Get medical help right away. Call your local emergency services (911 in the [place] Do not drive yourself to the hospital. Summary  Sleep apnea is a condition in which breathing pauses or becomes shallow during sleep.  The most common cause is a collapsed or blocked airway.  The goal of treatment is to restore normal breathing and to ease symptoms during sleep. This information is not intended to replace advice given to you by your health care provider. Make sure you discuss any  questions you have with your health care provider. Document Revised: 07/29/2018 Document Reviewed: 10/07/2017 Elsevier Patient Education  2021 [employer].

## 2020-08-14 DIAGNOSIS — F331 Major depressive disorder, recurrent, moderate: Secondary | ICD-10-CM | POA: Diagnosis not present

## 2020-11-03 DIAGNOSIS — K219 Gastro-esophageal reflux disease without esophagitis: Secondary | ICD-10-CM | POA: Diagnosis not present

## 2020-11-03 DIAGNOSIS — N1831 Chronic kidney disease, stage 3a: Secondary | ICD-10-CM | POA: Diagnosis not present

## 2020-11-03 DIAGNOSIS — F332 Major depressive disorder, recurrent severe without psychotic features: Secondary | ICD-10-CM | POA: Diagnosis not present

## 2020-11-03 DIAGNOSIS — G43709 Chronic migraine without aura, not intractable, without status migrainosus: Secondary | ICD-10-CM | POA: Diagnosis not present

## 2021-01-08 ENCOUNTER — Telehealth: Payer: Self-pay

## 2021-01-08 NOTE — Telephone Encounter (Signed)
New message - website CoverMyMeds   Your information has been sent to [place].   Jody Blackwell (Key: [id number]) Rx #: [id number] Nurtec 75MG  dispersible tablets   Form [employer] [facility] Electronic Prior Authorization Form ([employer]) 2017 NCPDP Created 3 days ago Sent to Plan 12 minutes ago Plan Response 12 minutes ago Submit Clinical Questions 1 minute ago Determination Wait for Determination Please wait for [employer] AM Better 2017 MHK to return a determination.

## 2021-01-08 NOTE — Telephone Encounter (Signed)
F/u   Jody Blackwell (Key: [id number]) Rx #: [id number] Nurtec 75MG  dispersible tablets   Form [employer] Electronic Prior Authorization Form ([employer]) 2017 NCPDP Created 3 days ago Sent to Plan 29 minutes ago Plan Response 29 minutes ago Submit Clinical Questions 18 minutes ago Determination Favorable 14 minutes ago Message from Plan Approved. This drug has been approved. Approved quantity: 8 units per 8 day(s). The drug has been approved from 12/25/2020 to 01/08/2022. Please call the pharmacy to process your prescription claim.

## 2021-01-16 ENCOUNTER — Other Ambulatory Visit: Payer: Self-pay | Admitting: Neurology

## 2021-02-05 ENCOUNTER — Ambulatory Visit: Payer: BC Managed Care – PPO | Admitting: Neurology
